# Patient Record
Sex: Female | Born: 1935 | Race: Black or African American | Hispanic: No | Smoking: Former smoker
Health system: Southern US, Community
[De-identification: ages and names within clinical notes are randomized; demographics above are authoritative.]

## PROBLEM LIST (undated history)

## (undated) DIAGNOSIS — Z955 Presence of coronary angioplasty implant and graft: Secondary | ICD-10-CM

## (undated) DIAGNOSIS — R011 Cardiac murmur, unspecified: Secondary | ICD-10-CM

## (undated) DIAGNOSIS — D649 Anemia, unspecified: Secondary | ICD-10-CM

## (undated) DIAGNOSIS — R7303 Prediabetes: Secondary | ICD-10-CM

## (undated) DIAGNOSIS — E785 Hyperlipidemia, unspecified: Secondary | ICD-10-CM

## (undated) DIAGNOSIS — I739 Peripheral vascular disease, unspecified: Secondary | ICD-10-CM

## (undated) DIAGNOSIS — E039 Hypothyroidism, unspecified: Secondary | ICD-10-CM

## (undated) DIAGNOSIS — I10 Essential (primary) hypertension: Secondary | ICD-10-CM

## (undated) DIAGNOSIS — Z9289 Personal history of other medical treatment: Secondary | ICD-10-CM

## (undated) DIAGNOSIS — M792 Neuralgia and neuritis, unspecified: Secondary | ICD-10-CM

## (undated) DIAGNOSIS — H409 Unspecified glaucoma: Secondary | ICD-10-CM

## (undated) DIAGNOSIS — B029 Zoster without complications: Secondary | ICD-10-CM

## (undated) DIAGNOSIS — R0989 Other specified symptoms and signs involving the circulatory and respiratory systems: Secondary | ICD-10-CM

## (undated) DIAGNOSIS — N183 Chronic kidney disease, stage 3 unspecified: Secondary | ICD-10-CM

## (undated) DIAGNOSIS — J45909 Unspecified asthma, uncomplicated: Secondary | ICD-10-CM

## (undated) DIAGNOSIS — I2510 Atherosclerotic heart disease of native coronary artery without angina pectoris: Secondary | ICD-10-CM

## (undated) HISTORY — DX: Hypothyroidism, unspecified: E03.9

## (undated) HISTORY — DX: Zoster without complications: B02.9

## (undated) HISTORY — DX: Prediabetes: R73.03

## (undated) HISTORY — DX: Unspecified glaucoma: H40.9

## (undated) HISTORY — DX: Personal history of other medical treatment: Z92.89

## (undated) HISTORY — DX: Presence of coronary angioplasty implant and graft: Z95.5

## (undated) HISTORY — DX: Neuralgia and neuritis, unspecified: M79.2

## (undated) HISTORY — DX: Essential (primary) hypertension: I10

## (undated) HISTORY — DX: Cardiac murmur, unspecified: R01.1

## (undated) HISTORY — DX: Hyperlipidemia, unspecified: E78.5

## (undated) HISTORY — DX: Anemia, unspecified: D64.9

## (undated) HISTORY — DX: Chronic kidney disease, stage 3 unspecified: N18.30

## (undated) HISTORY — DX: Unspecified asthma, uncomplicated: J45.909

## (undated) HISTORY — DX: Atherosclerotic heart disease of native coronary artery without angina pectoris: I25.10

## (undated) HISTORY — PX: BREAST LUMPECTOMY: SHX2

## (undated) HISTORY — PX: COLONOSCOPY: SHX174

## (undated) HISTORY — PX: CATARACT EXTRACTION: SUR2

## (undated) HISTORY — PX: TONSILLECTOMY: SUR1361

---
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
## 1999-05-28 ENCOUNTER — Other Ambulatory Visit: Admission: RE | Admit: 1999-05-28 | Discharge: 1999-05-28 | Payer: Self-pay | Admitting: Internal Medicine

## 1999-09-17 ENCOUNTER — Inpatient Hospital Stay (HOSPITAL_COMMUNITY): Admission: AD | Admit: 1999-09-17 | Discharge: 1999-09-20 | Payer: Self-pay | Admitting: Internal Medicine

## 2000-06-06 ENCOUNTER — Ambulatory Visit (HOSPITAL_COMMUNITY): Admission: RE | Admit: 2000-06-06 | Discharge: 2000-06-07 | Payer: Self-pay | Admitting: Cardiology

## 2000-06-24 ENCOUNTER — Encounter (HOSPITAL_COMMUNITY): Admission: RE | Admit: 2000-06-24 | Discharge: 2000-09-22 | Payer: Self-pay | Admitting: Cardiology

## 2000-09-30 ENCOUNTER — Ambulatory Visit (HOSPITAL_COMMUNITY): Admission: RE | Admit: 2000-09-30 | Discharge: 2000-10-01 | Payer: Self-pay | Admitting: Cardiology

## 2001-03-16 ENCOUNTER — Other Ambulatory Visit: Admission: RE | Admit: 2001-03-16 | Discharge: 2001-03-16 | Payer: Self-pay | Admitting: Internal Medicine

## 2004-06-18 ENCOUNTER — Ambulatory Visit (HOSPITAL_COMMUNITY): Admission: RE | Admit: 2004-06-18 | Discharge: 2004-06-18 | Payer: Self-pay | Admitting: Gastroenterology

## 2005-05-31 ENCOUNTER — Encounter: Admission: RE | Admit: 2005-05-31 | Discharge: 2005-05-31 | Payer: Self-pay | Admitting: Obstetrics and Gynecology

## 2005-07-05 ENCOUNTER — Ambulatory Visit (HOSPITAL_COMMUNITY): Admission: RE | Admit: 2005-07-05 | Discharge: 2005-07-05 | Payer: Self-pay | Admitting: Obstetrics and Gynecology

## 2011-04-27 ENCOUNTER — Emergency Department (HOSPITAL_COMMUNITY): Payer: Medicare Other

## 2011-04-27 ENCOUNTER — Emergency Department (HOSPITAL_COMMUNITY)
Admission: EM | Admit: 2011-04-27 | Discharge: 2011-04-27 | Disposition: A | Discharge status: Home / Self Care | Payer: Medicare Other | Attending: Emergency Medicine | Admitting: Emergency Medicine

## 2011-04-27 DIAGNOSIS — G8929 Other chronic pain: Secondary | ICD-10-CM | POA: Insufficient documentation

## 2011-04-27 DIAGNOSIS — I10 Essential (primary) hypertension: Secondary | ICD-10-CM | POA: Insufficient documentation

## 2011-04-27 DIAGNOSIS — R21 Rash and other nonspecific skin eruption: Secondary | ICD-10-CM | POA: Insufficient documentation

## 2011-04-27 DIAGNOSIS — I2510 Atherosclerotic heart disease of native coronary artery without angina pectoris: Secondary | ICD-10-CM | POA: Insufficient documentation

## 2011-04-27 DIAGNOSIS — B029 Zoster without complications: Secondary | ICD-10-CM | POA: Insufficient documentation

## 2011-04-27 DIAGNOSIS — M549 Dorsalgia, unspecified: Secondary | ICD-10-CM | POA: Insufficient documentation

## 2011-04-27 DIAGNOSIS — E7800 Pure hypercholesterolemia, unspecified: Secondary | ICD-10-CM | POA: Insufficient documentation

## 2011-04-27 DIAGNOSIS — E039 Hypothyroidism, unspecified: Secondary | ICD-10-CM | POA: Insufficient documentation

## 2011-07-26 DIAGNOSIS — Z79899 Other long term (current) drug therapy: Secondary | ICD-10-CM | POA: Diagnosis not present

## 2011-07-26 DIAGNOSIS — I10 Essential (primary) hypertension: Secondary | ICD-10-CM | POA: Diagnosis not present

## 2011-07-26 DIAGNOSIS — M545 Low back pain, unspecified: Secondary | ICD-10-CM | POA: Diagnosis not present

## 2011-10-08 DIAGNOSIS — Z1231 Encounter for screening mammogram for malignant neoplasm of breast: Secondary | ICD-10-CM | POA: Diagnosis not present

## 2011-11-05 DIAGNOSIS — I129 Hypertensive chronic kidney disease with stage 1 through stage 4 chronic kidney disease, or unspecified chronic kidney disease: Secondary | ICD-10-CM | POA: Diagnosis not present

## 2011-11-05 DIAGNOSIS — Z79899 Other long term (current) drug therapy: Secondary | ICD-10-CM | POA: Diagnosis not present

## 2011-11-05 DIAGNOSIS — E785 Hyperlipidemia, unspecified: Secondary | ICD-10-CM | POA: Diagnosis not present

## 2011-11-05 DIAGNOSIS — N183 Chronic kidney disease, stage 3 unspecified: Secondary | ICD-10-CM | POA: Diagnosis not present

## 2011-11-05 DIAGNOSIS — J3089 Other allergic rhinitis: Secondary | ICD-10-CM | POA: Diagnosis not present

## 2011-11-05 DIAGNOSIS — R7309 Other abnormal glucose: Secondary | ICD-10-CM | POA: Diagnosis not present

## 2011-11-05 DIAGNOSIS — J309 Allergic rhinitis, unspecified: Secondary | ICD-10-CM | POA: Diagnosis not present

## 2011-11-05 DIAGNOSIS — E039 Hypothyroidism, unspecified: Secondary | ICD-10-CM | POA: Diagnosis not present

## 2012-05-05 DIAGNOSIS — H2510 Age-related nuclear cataract, unspecified eye: Secondary | ICD-10-CM | POA: Diagnosis not present

## 2012-05-05 DIAGNOSIS — R51 Headache: Secondary | ICD-10-CM | POA: Diagnosis not present

## 2012-05-05 DIAGNOSIS — H40059 Ocular hypertension, unspecified eye: Secondary | ICD-10-CM | POA: Diagnosis not present

## 2012-05-05 DIAGNOSIS — H40019 Open angle with borderline findings, low risk, unspecified eye: Secondary | ICD-10-CM | POA: Diagnosis not present

## 2012-05-26 DIAGNOSIS — R7309 Other abnormal glucose: Secondary | ICD-10-CM | POA: Diagnosis not present

## 2012-05-26 DIAGNOSIS — Z79899 Other long term (current) drug therapy: Secondary | ICD-10-CM | POA: Diagnosis not present

## 2012-05-26 DIAGNOSIS — E039 Hypothyroidism, unspecified: Secondary | ICD-10-CM | POA: Diagnosis not present

## 2012-05-26 DIAGNOSIS — Z0000 Encounter for general adult medical examination without abnormal findings: Secondary | ICD-10-CM | POA: Diagnosis not present

## 2012-05-26 DIAGNOSIS — I10 Essential (primary) hypertension: Secondary | ICD-10-CM | POA: Diagnosis not present

## 2012-05-26 DIAGNOSIS — E785 Hyperlipidemia, unspecified: Secondary | ICD-10-CM | POA: Diagnosis not present

## 2012-05-26 DIAGNOSIS — Z23 Encounter for immunization: Secondary | ICD-10-CM | POA: Diagnosis not present

## 2012-05-26 DIAGNOSIS — J3089 Other allergic rhinitis: Secondary | ICD-10-CM | POA: Diagnosis not present

## 2012-05-26 DIAGNOSIS — Z1212 Encounter for screening for malignant neoplasm of rectum: Secondary | ICD-10-CM | POA: Diagnosis not present

## 2012-05-26 DIAGNOSIS — E559 Vitamin D deficiency, unspecified: Secondary | ICD-10-CM | POA: Diagnosis not present

## 2012-06-03 DIAGNOSIS — I10 Essential (primary) hypertension: Secondary | ICD-10-CM | POA: Diagnosis not present

## 2012-06-03 DIAGNOSIS — E785 Hyperlipidemia, unspecified: Secondary | ICD-10-CM | POA: Diagnosis not present

## 2012-06-03 DIAGNOSIS — Z23 Encounter for immunization: Secondary | ICD-10-CM | POA: Diagnosis not present

## 2012-06-03 DIAGNOSIS — E559 Vitamin D deficiency, unspecified: Secondary | ICD-10-CM | POA: Diagnosis not present

## 2012-06-03 DIAGNOSIS — Z1212 Encounter for screening for malignant neoplasm of rectum: Secondary | ICD-10-CM | POA: Diagnosis not present

## 2012-06-03 DIAGNOSIS — E039 Hypothyroidism, unspecified: Secondary | ICD-10-CM | POA: Diagnosis not present

## 2012-06-03 DIAGNOSIS — Z0000 Encounter for general adult medical examination without abnormal findings: Secondary | ICD-10-CM | POA: Diagnosis not present

## 2012-10-28 DIAGNOSIS — Z1231 Encounter for screening mammogram for malignant neoplasm of breast: Secondary | ICD-10-CM | POA: Diagnosis not present

## 2012-11-30 DIAGNOSIS — M810 Age-related osteoporosis without current pathological fracture: Secondary | ICD-10-CM | POA: Diagnosis not present

## 2012-11-30 DIAGNOSIS — R7309 Other abnormal glucose: Secondary | ICD-10-CM | POA: Diagnosis not present

## 2012-11-30 DIAGNOSIS — Z79899 Other long term (current) drug therapy: Secondary | ICD-10-CM | POA: Diagnosis not present

## 2012-11-30 DIAGNOSIS — E039 Hypothyroidism, unspecified: Secondary | ICD-10-CM | POA: Diagnosis not present

## 2012-11-30 DIAGNOSIS — Z0000 Encounter for general adult medical examination without abnormal findings: Secondary | ICD-10-CM | POA: Diagnosis not present

## 2012-11-30 DIAGNOSIS — I10 Essential (primary) hypertension: Secondary | ICD-10-CM | POA: Diagnosis not present

## 2012-11-30 DIAGNOSIS — E785 Hyperlipidemia, unspecified: Secondary | ICD-10-CM | POA: Diagnosis not present

## 2013-06-21 DIAGNOSIS — Z006 Encounter for examination for normal comparison and control in clinical research program: Secondary | ICD-10-CM | POA: Diagnosis not present

## 2013-06-21 DIAGNOSIS — E785 Hyperlipidemia, unspecified: Secondary | ICD-10-CM | POA: Diagnosis not present

## 2013-06-21 DIAGNOSIS — E669 Obesity, unspecified: Secondary | ICD-10-CM | POA: Diagnosis not present

## 2013-06-21 DIAGNOSIS — E039 Hypothyroidism, unspecified: Secondary | ICD-10-CM | POA: Diagnosis not present

## 2013-06-21 DIAGNOSIS — E559 Vitamin D deficiency, unspecified: Secondary | ICD-10-CM | POA: Diagnosis not present

## 2013-06-21 DIAGNOSIS — I119 Hypertensive heart disease without heart failure: Secondary | ICD-10-CM | POA: Diagnosis not present

## 2013-06-21 DIAGNOSIS — R7309 Other abnormal glucose: Secondary | ICD-10-CM | POA: Diagnosis not present

## 2013-06-21 DIAGNOSIS — M545 Low back pain, unspecified: Secondary | ICD-10-CM | POA: Diagnosis not present

## 2013-06-21 DIAGNOSIS — Z0000 Encounter for general adult medical examination without abnormal findings: Secondary | ICD-10-CM | POA: Diagnosis not present

## 2013-07-06 DIAGNOSIS — E785 Hyperlipidemia, unspecified: Secondary | ICD-10-CM | POA: Diagnosis not present

## 2013-08-31 DIAGNOSIS — R7309 Other abnormal glucose: Secondary | ICD-10-CM | POA: Diagnosis not present

## 2013-08-31 DIAGNOSIS — Z79899 Other long term (current) drug therapy: Secondary | ICD-10-CM | POA: Diagnosis not present

## 2013-08-31 DIAGNOSIS — I119 Hypertensive heart disease without heart failure: Secondary | ICD-10-CM | POA: Diagnosis not present

## 2013-10-29 DIAGNOSIS — Z1231 Encounter for screening mammogram for malignant neoplasm of breast: Secondary | ICD-10-CM | POA: Diagnosis not present

## 2013-12-20 DIAGNOSIS — Z79899 Other long term (current) drug therapy: Secondary | ICD-10-CM | POA: Diagnosis not present

## 2013-12-20 DIAGNOSIS — I10 Essential (primary) hypertension: Secondary | ICD-10-CM | POA: Diagnosis not present

## 2014-01-14 DIAGNOSIS — H469 Unspecified optic neuritis: Secondary | ICD-10-CM | POA: Diagnosis not present

## 2014-01-14 DIAGNOSIS — H2510 Age-related nuclear cataract, unspecified eye: Secondary | ICD-10-CM | POA: Diagnosis not present

## 2014-01-14 DIAGNOSIS — H4720 Unspecified optic atrophy: Secondary | ICD-10-CM | POA: Diagnosis not present

## 2014-01-14 DIAGNOSIS — H40059 Ocular hypertension, unspecified eye: Secondary | ICD-10-CM | POA: Diagnosis not present

## 2014-06-30 DIAGNOSIS — I10 Essential (primary) hypertension: Secondary | ICD-10-CM | POA: Diagnosis not present

## 2014-06-30 DIAGNOSIS — E784 Other hyperlipidemia: Secondary | ICD-10-CM | POA: Diagnosis not present

## 2014-06-30 DIAGNOSIS — Z79899 Other long term (current) drug therapy: Secondary | ICD-10-CM | POA: Diagnosis not present

## 2014-06-30 DIAGNOSIS — E039 Hypothyroidism, unspecified: Secondary | ICD-10-CM | POA: Diagnosis not present

## 2014-12-30 DIAGNOSIS — E784 Other hyperlipidemia: Secondary | ICD-10-CM | POA: Diagnosis not present

## 2014-12-30 DIAGNOSIS — E668 Other obesity: Secondary | ICD-10-CM | POA: Diagnosis not present

## 2014-12-30 DIAGNOSIS — Z79899 Other long term (current) drug therapy: Secondary | ICD-10-CM | POA: Diagnosis not present

## 2014-12-30 DIAGNOSIS — I119 Hypertensive heart disease without heart failure: Secondary | ICD-10-CM | POA: Diagnosis not present

## 2014-12-30 DIAGNOSIS — E039 Hypothyroidism, unspecified: Secondary | ICD-10-CM | POA: Diagnosis not present

## 2014-12-30 DIAGNOSIS — E559 Vitamin D deficiency, unspecified: Secondary | ICD-10-CM | POA: Diagnosis not present

## 2014-12-30 DIAGNOSIS — Z6837 Body mass index (BMI) 37.0-37.9, adult: Secondary | ICD-10-CM | POA: Diagnosis not present

## 2014-12-30 DIAGNOSIS — Z0000 Encounter for general adult medical examination without abnormal findings: Secondary | ICD-10-CM | POA: Diagnosis not present

## 2014-12-30 DIAGNOSIS — Z1389 Encounter for screening for other disorder: Secondary | ICD-10-CM | POA: Diagnosis not present

## 2015-01-26 DIAGNOSIS — H25813 Combined forms of age-related cataract, bilateral: Secondary | ICD-10-CM | POA: Diagnosis not present

## 2015-01-26 DIAGNOSIS — H40012 Open angle with borderline findings, low risk, left eye: Secondary | ICD-10-CM | POA: Diagnosis not present

## 2015-01-26 DIAGNOSIS — H4011X1 Primary open-angle glaucoma, mild stage: Secondary | ICD-10-CM | POA: Diagnosis not present

## 2015-02-09 DIAGNOSIS — H25813 Combined forms of age-related cataract, bilateral: Secondary | ICD-10-CM | POA: Diagnosis not present

## 2015-02-09 DIAGNOSIS — H4011X1 Primary open-angle glaucoma, mild stage: Secondary | ICD-10-CM | POA: Diagnosis not present

## 2015-02-09 DIAGNOSIS — H40012 Open angle with borderline findings, low risk, left eye: Secondary | ICD-10-CM | POA: Diagnosis not present

## 2015-02-17 DIAGNOSIS — H40033 Anatomical narrow angle, bilateral: Secondary | ICD-10-CM | POA: Diagnosis not present

## 2015-02-17 DIAGNOSIS — H40012 Open angle with borderline findings, low risk, left eye: Secondary | ICD-10-CM | POA: Diagnosis not present

## 2015-02-17 DIAGNOSIS — H4011X1 Primary open-angle glaucoma, mild stage: Secondary | ICD-10-CM | POA: Diagnosis not present

## 2015-03-10 DIAGNOSIS — H40012 Open angle with borderline findings, low risk, left eye: Secondary | ICD-10-CM | POA: Diagnosis not present

## 2015-03-10 DIAGNOSIS — H40033 Anatomical narrow angle, bilateral: Secondary | ICD-10-CM | POA: Diagnosis not present

## 2015-03-31 DIAGNOSIS — H40012 Open angle with borderline findings, low risk, left eye: Secondary | ICD-10-CM | POA: Diagnosis not present

## 2015-05-23 DIAGNOSIS — I119 Hypertensive heart disease without heart failure: Secondary | ICD-10-CM | POA: Diagnosis not present

## 2015-05-23 DIAGNOSIS — E039 Hypothyroidism, unspecified: Secondary | ICD-10-CM | POA: Diagnosis not present

## 2015-06-13 DIAGNOSIS — Z1231 Encounter for screening mammogram for malignant neoplasm of breast: Secondary | ICD-10-CM | POA: Diagnosis not present

## 2015-06-29 DIAGNOSIS — R7309 Other abnormal glucose: Secondary | ICD-10-CM | POA: Diagnosis not present

## 2015-06-29 DIAGNOSIS — I10 Essential (primary) hypertension: Secondary | ICD-10-CM | POA: Diagnosis not present

## 2015-06-29 DIAGNOSIS — E039 Hypothyroidism, unspecified: Secondary | ICD-10-CM | POA: Diagnosis not present

## 2015-09-28 DIAGNOSIS — H401111 Primary open-angle glaucoma, right eye, mild stage: Secondary | ICD-10-CM | POA: Diagnosis not present

## 2015-09-28 DIAGNOSIS — H2513 Age-related nuclear cataract, bilateral: Secondary | ICD-10-CM | POA: Diagnosis not present

## 2015-09-28 DIAGNOSIS — H40012 Open angle with borderline findings, low risk, left eye: Secondary | ICD-10-CM | POA: Diagnosis not present

## 2015-09-29 DIAGNOSIS — R05 Cough: Secondary | ICD-10-CM | POA: Diagnosis not present

## 2015-09-29 DIAGNOSIS — E784 Other hyperlipidemia: Secondary | ICD-10-CM | POA: Diagnosis not present

## 2015-09-29 DIAGNOSIS — I10 Essential (primary) hypertension: Secondary | ICD-10-CM | POA: Diagnosis not present

## 2015-09-29 DIAGNOSIS — E039 Hypothyroidism, unspecified: Secondary | ICD-10-CM | POA: Diagnosis not present

## 2015-12-29 DIAGNOSIS — I10 Essential (primary) hypertension: Secondary | ICD-10-CM | POA: Diagnosis not present

## 2015-12-29 DIAGNOSIS — E039 Hypothyroidism, unspecified: Secondary | ICD-10-CM | POA: Diagnosis not present

## 2015-12-29 DIAGNOSIS — R7303 Prediabetes: Secondary | ICD-10-CM | POA: Diagnosis not present

## 2016-03-14 DIAGNOSIS — Z87891 Personal history of nicotine dependence: Secondary | ICD-10-CM | POA: Diagnosis not present

## 2016-03-14 DIAGNOSIS — R0600 Dyspnea, unspecified: Secondary | ICD-10-CM | POA: Diagnosis not present

## 2016-03-21 ENCOUNTER — Other Ambulatory Visit: Payer: Self-pay | Admitting: Nurse Practitioner

## 2016-03-21 ENCOUNTER — Ambulatory Visit
Admission: RE | Admit: 2016-03-21 | Discharge: 2016-03-21 | Disposition: A | Discharge status: Home / Self Care | Payer: Medicare Other | Source: Ambulatory Visit | Attending: Nurse Practitioner | Admitting: Nurse Practitioner

## 2016-03-21 DIAGNOSIS — R05 Cough: Secondary | ICD-10-CM

## 2016-03-21 DIAGNOSIS — R059 Cough, unspecified: Secondary | ICD-10-CM

## 2016-03-21 DIAGNOSIS — R0600 Dyspnea, unspecified: Secondary | ICD-10-CM

## 2016-03-21 DIAGNOSIS — R0602 Shortness of breath: Secondary | ICD-10-CM | POA: Diagnosis not present

## 2016-04-11 DIAGNOSIS — H40012 Open angle with borderline findings, low risk, left eye: Secondary | ICD-10-CM | POA: Diagnosis not present

## 2016-04-11 DIAGNOSIS — H25813 Combined forms of age-related cataract, bilateral: Secondary | ICD-10-CM | POA: Diagnosis not present

## 2016-04-11 DIAGNOSIS — H401111 Primary open-angle glaucoma, right eye, mild stage: Secondary | ICD-10-CM | POA: Diagnosis not present

## 2016-04-18 DIAGNOSIS — R05 Cough: Secondary | ICD-10-CM | POA: Diagnosis not present

## 2016-08-22 DIAGNOSIS — Z0000 Encounter for general adult medical examination without abnormal findings: Secondary | ICD-10-CM | POA: Diagnosis not present

## 2016-08-22 DIAGNOSIS — Z87891 Personal history of nicotine dependence: Secondary | ICD-10-CM | POA: Diagnosis not present

## 2016-08-22 DIAGNOSIS — M810 Age-related osteoporosis without current pathological fracture: Secondary | ICD-10-CM | POA: Diagnosis not present

## 2016-08-22 DIAGNOSIS — I10 Essential (primary) hypertension: Secondary | ICD-10-CM | POA: Diagnosis not present

## 2016-08-22 DIAGNOSIS — E039 Hypothyroidism, unspecified: Secondary | ICD-10-CM | POA: Diagnosis not present

## 2016-08-22 DIAGNOSIS — R7309 Other abnormal glucose: Secondary | ICD-10-CM | POA: Diagnosis not present

## 2016-08-29 DIAGNOSIS — Z1231 Encounter for screening mammogram for malignant neoplasm of breast: Secondary | ICD-10-CM | POA: Diagnosis not present

## 2016-10-09 DIAGNOSIS — H25813 Combined forms of age-related cataract, bilateral: Secondary | ICD-10-CM | POA: Diagnosis not present

## 2016-10-09 DIAGNOSIS — H40012 Open angle with borderline findings, low risk, left eye: Secondary | ICD-10-CM | POA: Diagnosis not present

## 2016-10-09 DIAGNOSIS — H401111 Primary open-angle glaucoma, right eye, mild stage: Secondary | ICD-10-CM | POA: Diagnosis not present

## 2016-10-09 DIAGNOSIS — E119 Type 2 diabetes mellitus without complications: Secondary | ICD-10-CM | POA: Diagnosis not present

## 2016-11-14 ENCOUNTER — Other Ambulatory Visit: Payer: Self-pay | Admitting: Nephrology

## 2016-11-14 DIAGNOSIS — N183 Chronic kidney disease, stage 3 unspecified: Secondary | ICD-10-CM

## 2016-11-14 DIAGNOSIS — I10 Essential (primary) hypertension: Secondary | ICD-10-CM | POA: Diagnosis not present

## 2016-11-21 ENCOUNTER — Ambulatory Visit
Admission: RE | Admit: 2016-11-21 | Discharge: 2016-11-21 | Disposition: A | Discharge status: Home / Self Care | Payer: Medicare Other | Source: Ambulatory Visit | Attending: Nephrology | Admitting: Nephrology

## 2016-11-21 DIAGNOSIS — N183 Chronic kidney disease, stage 3 unspecified: Secondary | ICD-10-CM

## 2016-12-27 DIAGNOSIS — Z6833 Body mass index (BMI) 33.0-33.9, adult: Secondary | ICD-10-CM | POA: Diagnosis not present

## 2016-12-27 DIAGNOSIS — I10 Essential (primary) hypertension: Secondary | ICD-10-CM | POA: Diagnosis not present

## 2016-12-27 DIAGNOSIS — N183 Chronic kidney disease, stage 3 (moderate): Secondary | ICD-10-CM | POA: Diagnosis not present

## 2016-12-27 DIAGNOSIS — D472 Monoclonal gammopathy: Secondary | ICD-10-CM | POA: Diagnosis not present

## 2017-01-01 ENCOUNTER — Other Ambulatory Visit (HOSPITAL_COMMUNITY): Payer: Self-pay | Admitting: Nephrology

## 2017-01-01 DIAGNOSIS — D472 Monoclonal gammopathy: Secondary | ICD-10-CM

## 2017-01-03 ENCOUNTER — Ambulatory Visit (HOSPITAL_COMMUNITY)
Admission: RE | Admit: 2017-01-03 | Discharge: 2017-01-03 | Disposition: A | Discharge status: Home / Self Care | Payer: Medicare Other | Source: Ambulatory Visit | Attending: Nephrology | Admitting: Nephrology

## 2017-01-03 DIAGNOSIS — R930 Abnormal findings on diagnostic imaging of skull and head, not elsewhere classified: Secondary | ICD-10-CM | POA: Diagnosis not present

## 2017-01-03 DIAGNOSIS — D472 Monoclonal gammopathy: Secondary | ICD-10-CM | POA: Diagnosis not present

## 2017-01-03 DIAGNOSIS — M50823 Other cervical disc disorders at C6-C7 level: Secondary | ICD-10-CM | POA: Insufficient documentation

## 2017-02-03 DIAGNOSIS — M4306 Spondylolysis, lumbar region: Secondary | ICD-10-CM | POA: Diagnosis not present

## 2017-02-03 DIAGNOSIS — M545 Low back pain: Secondary | ICD-10-CM | POA: Diagnosis not present

## 2017-02-03 DIAGNOSIS — I10 Essential (primary) hypertension: Secondary | ICD-10-CM | POA: Diagnosis not present

## 2017-02-03 DIAGNOSIS — E039 Hypothyroidism, unspecified: Secondary | ICD-10-CM | POA: Diagnosis not present

## 2017-02-03 DIAGNOSIS — M47816 Spondylosis without myelopathy or radiculopathy, lumbar region: Secondary | ICD-10-CM | POA: Diagnosis not present

## 2017-02-03 DIAGNOSIS — M5441 Lumbago with sciatica, right side: Secondary | ICD-10-CM | POA: Diagnosis not present

## 2017-02-17 ENCOUNTER — Other Ambulatory Visit: Payer: Self-pay | Admitting: Nephrology

## 2017-02-17 DIAGNOSIS — S14111S Complete lesion at C1 level of cervical spinal cord, sequela: Secondary | ICD-10-CM

## 2017-02-21 ENCOUNTER — Ambulatory Visit
Admission: RE | Admit: 2017-02-21 | Discharge: 2017-02-21 | Disposition: A | Discharge status: Home / Self Care | Payer: Medicare Other | Source: Ambulatory Visit | Attending: Nephrology | Admitting: Nephrology

## 2017-02-21 DIAGNOSIS — S14111S Complete lesion at C1 level of cervical spinal cord, sequela: Secondary | ICD-10-CM

## 2017-02-21 DIAGNOSIS — M50223 Other cervical disc displacement at C6-C7 level: Secondary | ICD-10-CM | POA: Diagnosis not present

## 2017-02-21 DIAGNOSIS — M50221 Other cervical disc displacement at C4-C5 level: Secondary | ICD-10-CM | POA: Diagnosis not present

## 2017-02-21 DIAGNOSIS — M50222 Other cervical disc displacement at C5-C6 level: Secondary | ICD-10-CM | POA: Diagnosis not present

## 2017-02-21 MED ORDER — IOPAMIDOL (ISOVUE-300) INJECTION 61%
75.0000 mL | Freq: Once | INTRAVENOUS | Status: AC | PRN
  Administered 2017-02-21: 75 mL via INTRAVENOUS

## 2017-02-28 DIAGNOSIS — D472 Monoclonal gammopathy: Secondary | ICD-10-CM | POA: Diagnosis not present

## 2017-02-28 DIAGNOSIS — N183 Chronic kidney disease, stage 3 (moderate): Secondary | ICD-10-CM | POA: Diagnosis not present

## 2017-02-28 DIAGNOSIS — Z6833 Body mass index (BMI) 33.0-33.9, adult: Secondary | ICD-10-CM | POA: Diagnosis not present

## 2017-02-28 DIAGNOSIS — I10 Essential (primary) hypertension: Secondary | ICD-10-CM | POA: Diagnosis not present

## 2017-03-04 DIAGNOSIS — I10 Essential (primary) hypertension: Secondary | ICD-10-CM | POA: Diagnosis not present

## 2017-03-04 DIAGNOSIS — M5441 Lumbago with sciatica, right side: Secondary | ICD-10-CM | POA: Diagnosis not present

## 2017-03-04 DIAGNOSIS — G8929 Other chronic pain: Secondary | ICD-10-CM | POA: Diagnosis not present

## 2017-03-13 DIAGNOSIS — M4317 Spondylolisthesis, lumbosacral region: Secondary | ICD-10-CM | POA: Diagnosis not present

## 2017-03-13 DIAGNOSIS — M47819 Spondylosis without myelopathy or radiculopathy, site unspecified: Secondary | ICD-10-CM | POA: Diagnosis not present

## 2017-03-19 DIAGNOSIS — M545 Low back pain: Secondary | ICD-10-CM | POA: Diagnosis not present

## 2017-03-19 DIAGNOSIS — I10 Essential (primary) hypertension: Secondary | ICD-10-CM | POA: Diagnosis not present

## 2017-03-19 DIAGNOSIS — E039 Hypothyroidism, unspecified: Secondary | ICD-10-CM | POA: Diagnosis not present

## 2017-04-02 DIAGNOSIS — M25551 Pain in right hip: Secondary | ICD-10-CM | POA: Diagnosis not present

## 2017-04-02 DIAGNOSIS — M545 Low back pain: Secondary | ICD-10-CM | POA: Diagnosis not present

## 2017-04-11 DIAGNOSIS — M25551 Pain in right hip: Secondary | ICD-10-CM | POA: Diagnosis not present

## 2017-04-11 DIAGNOSIS — M545 Low back pain: Secondary | ICD-10-CM | POA: Diagnosis not present

## 2017-04-14 DIAGNOSIS — M25551 Pain in right hip: Secondary | ICD-10-CM | POA: Diagnosis not present

## 2017-04-14 DIAGNOSIS — M545 Low back pain: Secondary | ICD-10-CM | POA: Diagnosis not present

## 2017-04-17 DIAGNOSIS — H469 Unspecified optic neuritis: Secondary | ICD-10-CM | POA: Diagnosis not present

## 2017-04-17 DIAGNOSIS — H401111 Primary open-angle glaucoma, right eye, mild stage: Secondary | ICD-10-CM | POA: Diagnosis not present

## 2017-04-17 DIAGNOSIS — E119 Type 2 diabetes mellitus without complications: Secondary | ICD-10-CM | POA: Diagnosis not present

## 2017-04-17 DIAGNOSIS — H25813 Combined forms of age-related cataract, bilateral: Secondary | ICD-10-CM | POA: Diagnosis not present

## 2017-04-17 DIAGNOSIS — H40012 Open angle with borderline findings, low risk, left eye: Secondary | ICD-10-CM | POA: Diagnosis not present

## 2017-04-18 ENCOUNTER — Encounter: Payer: Self-pay | Admitting: Podiatry

## 2017-04-18 ENCOUNTER — Ambulatory Visit (INDEPENDENT_AMBULATORY_CARE_PROVIDER_SITE_OTHER): Payer: Medicare Other | Admitting: Podiatry

## 2017-04-18 VITALS — BP 152/73 | HR 64

## 2017-04-18 DIAGNOSIS — B351 Tinea unguium: Secondary | ICD-10-CM | POA: Diagnosis not present

## 2017-04-18 DIAGNOSIS — M79674 Pain in right toe(s): Secondary | ICD-10-CM | POA: Diagnosis not present

## 2017-04-18 DIAGNOSIS — M25551 Pain in right hip: Secondary | ICD-10-CM | POA: Diagnosis not present

## 2017-04-18 DIAGNOSIS — M79675 Pain in left toe(s): Secondary | ICD-10-CM | POA: Diagnosis not present

## 2017-04-18 DIAGNOSIS — M545 Low back pain: Secondary | ICD-10-CM | POA: Diagnosis not present

## 2017-04-18 NOTE — Progress Notes (Signed)
   Subjective:    Patient ID: Brenda Barry, female    DOB: 01/20/1936, 81 y.o.   MRN: [id number]  HPI this patient presents the office with chief complaint of long thick painful nails.  She states her nails are painful walking and wearing her shoes.  She says that she is unable to self treat.  She presents the office today for preventative foot care services    Review of Systems  HENT: Positive for hearing loss and tinnitus.   Cardiovascular: Positive for leg swelling.  Genitourinary: Positive for frequency.  Musculoskeletal: Positive for gait problem.       Joint pain  Skin:       Change in nails  Neurological: Positive for numbness.       Objective:   Physical Exam General Appearance  Alert, conversant and in no acute stress.  Vascular  Dorsalis pedis and posterior pulses are palpable  bilaterally.  Capillary return is within normal limits  Bilaterally. Temperature is within normal limits  Bilaterally  Neurologic  Senn-Weinstein monofilament wire test within normal limits  bilaterally. Muscle power  Within normal limits bilaterally.  Nails Thick disfigured discolored nails with subungual debride bilaterally from hallux to fifth toes bilaterally. No evidence of bacterial infection or drainage bilaterally.  Orthopedic  No limitations of motion of motion feet bilaterally.  No crepitus or effusions noted.  HAV  B/L   Skin  normotropic skin with no porokeratosis noted bilaterally.  No signs of infection ulcers noted.          Assessment & Plan:  Onychomycosis  B/L  IE  Debridement of nails.  RTC 3 months.   Gardiner Barefoot DPM

## 2017-04-21 DIAGNOSIS — M25551 Pain in right hip: Secondary | ICD-10-CM | POA: Diagnosis not present

## 2017-04-21 DIAGNOSIS — M545 Low back pain: Secondary | ICD-10-CM | POA: Diagnosis not present

## 2017-04-24 DIAGNOSIS — M545 Low back pain: Secondary | ICD-10-CM | POA: Diagnosis not present

## 2017-04-24 DIAGNOSIS — M25551 Pain in right hip: Secondary | ICD-10-CM | POA: Diagnosis not present

## 2017-04-29 DIAGNOSIS — D472 Monoclonal gammopathy: Secondary | ICD-10-CM | POA: Diagnosis not present

## 2017-04-29 DIAGNOSIS — N183 Chronic kidney disease, stage 3 (moderate): Secondary | ICD-10-CM | POA: Diagnosis not present

## 2017-04-29 DIAGNOSIS — Z6833 Body mass index (BMI) 33.0-33.9, adult: Secondary | ICD-10-CM | POA: Diagnosis not present

## 2017-04-29 DIAGNOSIS — I10 Essential (primary) hypertension: Secondary | ICD-10-CM | POA: Diagnosis not present

## 2017-05-05 DIAGNOSIS — H268 Other specified cataract: Secondary | ICD-10-CM | POA: Diagnosis not present

## 2017-05-05 DIAGNOSIS — H2511 Age-related nuclear cataract, right eye: Secondary | ICD-10-CM | POA: Diagnosis not present

## 2017-05-12 DIAGNOSIS — M25551 Pain in right hip: Secondary | ICD-10-CM | POA: Diagnosis not present

## 2017-05-12 DIAGNOSIS — M545 Low back pain: Secondary | ICD-10-CM | POA: Diagnosis not present

## 2017-05-15 DIAGNOSIS — H2512 Age-related nuclear cataract, left eye: Secondary | ICD-10-CM | POA: Diagnosis not present

## 2017-05-19 DIAGNOSIS — H25812 Combined forms of age-related cataract, left eye: Secondary | ICD-10-CM | POA: Diagnosis not present

## 2017-05-19 DIAGNOSIS — H2512 Age-related nuclear cataract, left eye: Secondary | ICD-10-CM | POA: Diagnosis not present

## 2017-05-27 DIAGNOSIS — M545 Low back pain: Secondary | ICD-10-CM | POA: Diagnosis not present

## 2017-05-27 DIAGNOSIS — M25551 Pain in right hip: Secondary | ICD-10-CM | POA: Diagnosis not present

## 2017-05-29 DIAGNOSIS — M25551 Pain in right hip: Secondary | ICD-10-CM | POA: Diagnosis not present

## 2017-05-29 DIAGNOSIS — M545 Low back pain: Secondary | ICD-10-CM | POA: Diagnosis not present

## 2017-06-10 DIAGNOSIS — M25551 Pain in right hip: Secondary | ICD-10-CM | POA: Diagnosis not present

## 2017-06-10 DIAGNOSIS — M545 Low back pain: Secondary | ICD-10-CM | POA: Diagnosis not present

## 2017-06-17 DIAGNOSIS — M25551 Pain in right hip: Secondary | ICD-10-CM | POA: Diagnosis not present

## 2017-06-17 DIAGNOSIS — M545 Low back pain: Secondary | ICD-10-CM | POA: Diagnosis not present

## 2017-07-18 ENCOUNTER — Ambulatory Visit (INDEPENDENT_AMBULATORY_CARE_PROVIDER_SITE_OTHER): Payer: Medicare Other | Admitting: Podiatry

## 2017-07-18 ENCOUNTER — Encounter: Payer: Self-pay | Admitting: Podiatry

## 2017-07-18 DIAGNOSIS — M79674 Pain in right toe(s): Secondary | ICD-10-CM

## 2017-07-18 DIAGNOSIS — B351 Tinea unguium: Secondary | ICD-10-CM

## 2017-07-18 DIAGNOSIS — M79675 Pain in left toe(s): Secondary | ICD-10-CM

## 2017-07-18 NOTE — Progress Notes (Addendum)
Complaint:  Visit Type: Patient returns to my office for continued preventative foot care services. Complaint: Patient states" my nails have grown long and thick and become painful to walk and wear shoes"The patient presents for preventative foot care services. No changes to ROS  Podiatric Exam: Vascular: dorsalis pedis and posterior tibial pulses are palpable bilateral. Capillary return is immediate. Temperature gradient is WNL. Skin turgor WNL  Sensorium: Normal Semmes Weinstein monofilament test. Normal tactile sensation bilaterally. Nail Exam: Pt has thick disfigured discolored nails with subungual debris noted bilateral entire nail hallux through fifth toenails Ulcer Exam: There is no evidence of ulcer or pre-ulcerative changes or infection. Orthopedic Exam: Muscle tone and strength are WNL. No limitations in general ROM. No crepitus or effusions noted. Foot type and digits show no abnormalities. HAV  B/L. Skin: No Porokeratosis. No infection or ulcers  Diagnosis:  Onychomycosis, , Pain in right toe, pain in left toes  Treatment & Plan Procedures and Treatment: Consent by patient was obtained for treatment procedures.   Debridement of mycotic and hypertrophic toenails, 1 through 5 bilateral and clearing of subungual debris. No ulceration, no infection noted. ABN signed for 2018. Return Visit-Office Procedure: Patient instructed to return to the office for a follow up visit 3 months for continued evaluation and treatment.    Gardiner Barefoot DPM

## 2017-07-24 DIAGNOSIS — E039 Hypothyroidism, unspecified: Secondary | ICD-10-CM | POA: Diagnosis not present

## 2017-07-24 DIAGNOSIS — M5441 Lumbago with sciatica, right side: Secondary | ICD-10-CM | POA: Diagnosis not present

## 2017-07-24 DIAGNOSIS — G8929 Other chronic pain: Secondary | ICD-10-CM | POA: Diagnosis not present

## 2017-07-24 DIAGNOSIS — I10 Essential (primary) hypertension: Secondary | ICD-10-CM | POA: Diagnosis not present

## 2017-10-17 ENCOUNTER — Ambulatory Visit (INDEPENDENT_AMBULATORY_CARE_PROVIDER_SITE_OTHER): Payer: Medicare Other | Admitting: Podiatry

## 2017-10-17 ENCOUNTER — Encounter: Payer: Self-pay | Admitting: Podiatry

## 2017-10-17 DIAGNOSIS — B351 Tinea unguium: Secondary | ICD-10-CM | POA: Diagnosis not present

## 2017-10-17 DIAGNOSIS — M79675 Pain in left toe(s): Secondary | ICD-10-CM | POA: Diagnosis not present

## 2017-10-17 DIAGNOSIS — M79674 Pain in right toe(s): Secondary | ICD-10-CM | POA: Diagnosis not present

## 2017-10-17 NOTE — Progress Notes (Signed)
Complaint:  Visit Type: Patient returns to my office for continued preventative foot care services. Complaint: Patient states" my nails have grown long and thick and become painful to walk and wear shoes"The patient presents for preventative foot care services. No changes to ROS  Podiatric Exam: Vascular: dorsalis pedis and posterior tibial pulses are palpable bilateral. Capillary return is immediate. Temperature gradient is WNL. Skin turgor WNL  Sensorium: Normal Semmes Weinstein monofilament test. Normal tactile sensation bilaterally. Nail Exam: Pt has thick disfigured discolored nails with subungual debris noted bilateral entire nail hallux through fifth toenails Ulcer Exam: There is no evidence of ulcer or pre-ulcerative changes or infection. Orthopedic Exam: Muscle tone and strength are WNL. No limitations in general ROM. No crepitus or effusions noted. Foot type and digits show no abnormalities. HAV  B/L. Skin: No Porokeratosis. No infection or ulcers  Diagnosis:  Onychomycosis, , Pain in right toe, pain in left toes  Treatment & Plan Procedures and Treatment: Consent by patient was obtained for treatment procedures.   Debridement of mycotic and hypertrophic toenails, 1 through 5 bilateral and clearing of subungual debris. No ulceration, no infection noted. ABN signed for 2019. Return Visit-Office Procedure: Patient instructed to return to the office for a follow up visit 3 months for continued evaluation and treatment.    Umaima Scholten DPM 

## 2017-12-16 DIAGNOSIS — H469 Unspecified optic neuritis: Secondary | ICD-10-CM | POA: Diagnosis not present

## 2017-12-16 DIAGNOSIS — H401131 Primary open-angle glaucoma, bilateral, mild stage: Secondary | ICD-10-CM | POA: Diagnosis not present

## 2017-12-16 DIAGNOSIS — E119 Type 2 diabetes mellitus without complications: Secondary | ICD-10-CM | POA: Diagnosis not present

## 2017-12-16 DIAGNOSIS — H04123 Dry eye syndrome of bilateral lacrimal glands: Secondary | ICD-10-CM | POA: Diagnosis not present

## 2017-12-16 DIAGNOSIS — Z961 Presence of intraocular lens: Secondary | ICD-10-CM | POA: Diagnosis not present

## 2018-01-16 ENCOUNTER — Ambulatory Visit (INDEPENDENT_AMBULATORY_CARE_PROVIDER_SITE_OTHER): Payer: Medicare Other | Admitting: Podiatry

## 2018-01-16 ENCOUNTER — Encounter: Payer: Self-pay | Admitting: Podiatry

## 2018-01-16 DIAGNOSIS — M79674 Pain in right toe(s): Secondary | ICD-10-CM | POA: Diagnosis not present

## 2018-01-16 DIAGNOSIS — M79675 Pain in left toe(s): Secondary | ICD-10-CM

## 2018-01-16 DIAGNOSIS — B351 Tinea unguium: Secondary | ICD-10-CM | POA: Diagnosis not present

## 2018-01-16 NOTE — Progress Notes (Signed)
Complaint:  Visit Type: Patient returns to my office for continued preventative foot care services. Complaint: Patient states" my nails have grown long and thick and become painful to walk and wear shoes"The patient presents for preventative foot care services. No changes to ROS  Podiatric Exam: Vascular: dorsalis pedis and posterior tibial pulses are palpable bilateral. Capillary return is immediate. Temperature gradient is WNL. Skin turgor WNL  Sensorium: Normal Semmes Weinstein monofilament test. Normal tactile sensation bilaterally. Nail Exam: Pt has thick disfigured discolored nails with subungual debris noted bilateral entire nail hallux through fifth toenails Ulcer Exam: There is no evidence of ulcer or pre-ulcerative changes or infection. Orthopedic Exam: Muscle tone and strength are WNL. No limitations in general ROM. No crepitus or effusions noted. Foot type and digits show no abnormalities. HAV  B/L. Skin: No Porokeratosis. No infection or ulcers  Diagnosis:  Onychomycosis, , Pain in right toe, pain in left toes  Treatment & Plan Procedures and Treatment: Consent by patient was obtained for treatment procedures.   Debridement of mycotic and hypertrophic toenails, 1 through 5 bilateral and clearing of subungual debris. No ulceration, no infection noted. ABN signed for 2019. Return Visit-Office Procedure: Patient instructed to return to the office for a follow up visit 3 months for continued evaluation and treatment.    Gardiner Barefoot DPM

## 2018-01-21 DIAGNOSIS — I10 Essential (primary) hypertension: Secondary | ICD-10-CM | POA: Diagnosis not present

## 2018-01-21 DIAGNOSIS — E039 Hypothyroidism, unspecified: Secondary | ICD-10-CM | POA: Diagnosis not present

## 2018-01-21 DIAGNOSIS — J45909 Unspecified asthma, uncomplicated: Secondary | ICD-10-CM | POA: Diagnosis not present

## 2018-01-21 DIAGNOSIS — E785 Hyperlipidemia, unspecified: Secondary | ICD-10-CM | POA: Diagnosis not present

## 2018-03-02 DIAGNOSIS — E785 Hyperlipidemia, unspecified: Secondary | ICD-10-CM | POA: Diagnosis not present

## 2018-03-02 DIAGNOSIS — I10 Essential (primary) hypertension: Secondary | ICD-10-CM | POA: Diagnosis not present

## 2018-04-03 ENCOUNTER — Other Ambulatory Visit: Payer: Self-pay

## 2018-04-03 ENCOUNTER — Emergency Department (HOSPITAL_COMMUNITY)
Admission: EM | Admit: 2018-04-03 | Discharge: 2018-04-03 | Disposition: A | Discharge status: Home / Self Care | Payer: Medicare Other | Attending: Emergency Medicine | Admitting: Emergency Medicine

## 2018-04-03 ENCOUNTER — Encounter (HOSPITAL_COMMUNITY): Payer: Self-pay

## 2018-04-03 DIAGNOSIS — M5441 Lumbago with sciatica, right side: Secondary | ICD-10-CM | POA: Diagnosis not present

## 2018-04-03 DIAGNOSIS — Z7982 Long term (current) use of aspirin: Secondary | ICD-10-CM | POA: Diagnosis not present

## 2018-04-03 DIAGNOSIS — M5431 Sciatica, right side: Secondary | ICD-10-CM | POA: Diagnosis not present

## 2018-04-03 DIAGNOSIS — Z79899 Other long term (current) drug therapy: Secondary | ICD-10-CM | POA: Insufficient documentation

## 2018-04-03 DIAGNOSIS — Z87891 Personal history of nicotine dependence: Secondary | ICD-10-CM | POA: Diagnosis not present

## 2018-04-03 DIAGNOSIS — M25551 Pain in right hip: Secondary | ICD-10-CM | POA: Diagnosis present

## 2018-04-03 DIAGNOSIS — R202 Paresthesia of skin: Secondary | ICD-10-CM | POA: Diagnosis not present

## 2018-04-03 MED ORDER — HYDROCODONE-ACETAMINOPHEN 5-325 MG PO TABS: 1.0000 | ORAL_TABLET | Freq: Four times a day (QID) | ORAL | 0 refills | Status: DC | PRN

## 2018-04-03 MED ORDER — DEXAMETHASONE SODIUM PHOSPHATE 10 MG/ML IJ SOLN
10.0000 mg | Freq: Once | INTRAMUSCULAR | Status: AC
  Administered 2018-04-03: 10 mg via INTRAMUSCULAR
  Filled 2018-04-03: qty 1

## 2018-04-03 MED ORDER — ORPHENADRINE CITRATE ER 100 MG PO TB12
100.0000 mg | ORAL_TABLET | Freq: Once | ORAL | Status: AC
  Administered 2018-04-03: 100 mg via ORAL
  Filled 2018-04-03: qty 1

## 2018-04-03 MED ORDER — ORPHENADRINE CITRATE ER 100 MG PO TB12: 100.0000 mg | ORAL_TABLET | Freq: Two times a day (BID) | ORAL | 0 refills | Status: DC

## 2018-04-03 MED ORDER — HYDROMORPHONE HCL 1 MG/ML IJ SOLN
1.0000 mg | Freq: Once | INTRAMUSCULAR | Status: AC
  Administered 2018-04-03: 1 mg via INTRAMUSCULAR
  Filled 2018-04-03: qty 1

## 2018-04-03 MED ORDER — PREDNISONE 20 MG PO TABS: ORAL_TABLET | ORAL | 0 refills | Status: DC

## 2018-04-03 NOTE — ED Provider Notes (Signed)
[facility] EMERGENCY DEPARTMENT Provider Note   CSN: [id number] Arrival date & time: 04/03/18  0731     History   Chief Complaint Chief Complaint  Patient presents with  . Hip Pain    HPI Brenda Barry is a 82 y.o. female.  HPI Patient has had ongoing problems with right-sided back and hip pain for about a year.  She reports that she has had x-rays that show osteoporosis and degenerative joint disease.  She used to take anti-inflammatories but was subsequently told they were not safe for her kidneys.  She only occasionally takes Tylenol but she feels that that gives her tinnitus so she also avoids it as much as possible.  Over the past several days the pain got much worse including radiation down the front of her thigh and tingling in her toes.  She reports that she usually takes care of all of her own affairs.  She walks without a cane and does chores.  She drives.  She does note that regardless of any exacerbations of the back pain, she always will get pain if she extends the right leg as she is getting in and out of a car.  She reports only as of today has the pain been so severe as to impede her walking.  There is been no change in bowel or bladder habits.  No incontinence or urinary retention.  No abdominal pain. History reviewed. No pertinent past medical history.  There are no active problems to display for this patient.   History reviewed. No pertinent surgical history.   OB History   None      Home Medications    Prior to Admission medications   Medication Sig Start Date End Date Taking? Authorizing Provider  Acetaminophen (TYLENOL ARTHRITIS PAIN PO) Take by mouth as needed.    [provider]  amLODipine (NORVASC) 5 MG tablet  03/20/17   [provider]  aspirin 81 MG chewable tablet Chew by mouth.    [provider]  HYDROcodone-acetaminophen (NORCO/VICODIN) 5-325 MG tablet Take 1-2 tablets by mouth every 6 (six)  hours as needed for moderate pain or severe pain. 04/03/18   Charlesetta Shanks, MD  irbesartan-hydrochlorothiazide (AVALIDE) 300-12.5 MG tablet Take 1 tablet by mouth daily.    [provider]  metoprolol succinate (TOPROL-XL) 100 MG 24 hr tablet  03/19/17   [provider]  orphenadrine (NORFLEX) 100 MG tablet Take 1 tablet (100 mg total) by mouth 2 (two) times daily. 04/03/18   Charlesetta Shanks, MD  predniSONE (DELTASONE) 20 MG tablet 2 tabs po daily x 3 days start date 9/16 04/06/18   Charlesetta Shanks, MD  SYNTHROID 100 MCG tablet  03/19/17   [provider]    Family History No family history on file.  Social History Social History   Tobacco Use  . Smoking status: Former Smoker    Types: Cigarettes    Last attempt to quit: 04/19/1971    Years since quitting: 46.9  . Smokeless tobacco: Never Used  Substance Use Topics  . Alcohol use: No  . Drug use: No     Allergies   Patient has no known allergies.   Review of Systems Review of Systems 10 Systems reviewed and are negative for acute change except as noted in the HPI.   Physical Exam Updated Vital Signs BP (!) 139/110   Pulse 64   Temp 98.8 F (37.1 C) (Oral)   Resp 18   SpO2 97%  Physical Exam  Constitutional: She is oriented to person, place, and time. She appears well-developed and well-nourished.  Patient is alert and nontoxic.  No distress at rest.  Obesity.  Pain is exacerbated with bringing the stretcher up or down or asking her to roll on her side.  HENT:  Head: Normocephalic and atraumatic.  Eyes: EOM are normal.  Cardiovascular: Normal rate, regular rhythm, normal heart sounds and intact distal pulses.  Pulmonary/Chest: Effort normal and breath sounds normal.  Abdominal: Soft. She exhibits no distension. There is no tenderness. There is no guarding.  Musculoskeletal: She exhibits no edema.  Some reproducible pain over the right SI joint.  Pain is exquisitely reproducible with  patient's attempt to roll on her side or significant position changing from supine to upright or vice versa.  Positive right straight leg raise at 30 degrees.  No peripheral edema, soft tissue abnormality or calf tenderness.  Dorsalis pedis pulses 2+ symmetric.  Neurological: She is alert and oriented to person, place, and time. No cranial nerve deficit. Coordination normal.  Patient is able to hold her right leg off of the bed without assistance.  She cannot resist downward pressure secondary to pain.  Normal dorsiflexion and extension at the foot.  Endorses some decreased sensation to light touch on the foot.  Skin: Skin is warm and dry.  Psychiatric: She has a normal mood and affect.     ED Treatments / Results  Labs (all labs ordered are listed, but only abnormal results are displayed) Labs Reviewed - No data to display  EKG None  Radiology No results found.  Procedures Procedures (including critical care time)  Medications Ordered in ED Medications  dexamethasone (DECADRON) injection 10 mg (10 mg Intramuscular Given 04/03/18 0841)  HYDROmorphone (DILAUDID) injection 1 mg (1 mg Intramuscular Given 04/03/18 0841)  orphenadrine (NORFLEX) 12 hr tablet 100 mg (100 mg Oral Given 04/03/18 2725)     Initial Impression / Assessment and Plan / ED Course  I have reviewed the triage vital signs and the nursing notes.  Pertinent labs & imaging results that were available during my care of the patient were reviewed by me and considered in my medical decision making (see chart for details).  Clinical Course as of Apr 03 1104  Fri Apr 03, 2018  1021 Patient reports pain has improved.  She states she still has pain in her back but seems to be improving.  Will attempt ambulation.   [MP]  3664 Patient up and ambulated with a rolling walker with coordinated and steady gait.  Pain much improved.  Mental status clear and alert.   [MP]    Clinical Course User Index [MP] Charlesetta Shanks, MD    Patient presents with classic sciatica presentation.  She has had chronic and recurrent pain for about a year.  It however got much worse over the past day including numbness and tingling of her foot.  Patient strength is intact.  Patient adamantly refused MRI stating she has way to severe claustrophobia.  I did offer mild sedation with Ativan however she advised that would not be sufficient and she had no intention of getting an MRI.  Patient is counseled that she must follow-up with her doctor for response to treatment.  If symptoms are not significantly improving or there is any worsening she will need to discuss scheduling an open MRI.  Patient was treated for pain with good result.  She has now been able to stand and ambulate.  I have  provided a rolling walker for some additional stability due to patient's age and potential for fall with pain or sciatica.  She did very well using a walker in a coordinated fashion.  Patient was given Decadron IM.  She is to start a short burst of prednisone in 2 days.  She is given Vicodin and Norflex to use for pain control.  Final Clinical Impressions(s) / ED Diagnoses   Final diagnoses:  Sciatica of right side    ED Discharge Orders         Ordered    predniSONE (DELTASONE) 20 MG tablet     04/03/18 1059    HYDROcodone-acetaminophen (NORCO/VICODIN) 5-325 MG tablet  Every 6 hours PRN     04/03/18 1059    orphenadrine (NORFLEX) 100 MG tablet  2 times daily     04/03/18 1059           Charlesetta Shanks, MD 04/03/18 1107

## 2018-04-03 NOTE — ED Notes (Signed)
Pt denies pain while sitting still. Pain increases with sitting up.

## 2018-04-03 NOTE — ED Notes (Signed)
ED Provider at bedside. 

## 2018-04-03 NOTE — Discharge Instructions (Addendum)
1.  You were given a shot of Decadron in the emergency department.  This is a steroid and will continue to be effective for several days.  Start prednisone as prescribed on the morning of September 16.  You may take Vicodin as prescribed 1 to 2 tablets every 6 hours if needed for additional pain control.  Take Norflex twice a day as prescribed for muscle spasms.  If you have any tendency towards constipation, take an over-the-counter stool softener because narcotic pain medications such as Vicodin can be constipating. 2.  Call your doctor today to schedule a recheck at the beginning of the week. 3.  Return to the emergency department if you have increasing weakness, numbness, bowel or bladder dysfunction or pain not controlled by medications.

## 2018-04-03 NOTE — ED Notes (Signed)
DME delivered to patient, d/c reviewed with patient and family No further questions at this time

## 2018-04-03 NOTE — ED Triage Notes (Signed)
Per EMS, pt from home with a c/o of right sided hip pain that began this morning upon waking up. The pain radiates down her right leg. She experiences "numbness" in bilateral feet. No injuries occurred.

## 2018-04-08 DIAGNOSIS — E039 Hypothyroidism, unspecified: Secondary | ICD-10-CM | POA: Diagnosis not present

## 2018-04-08 DIAGNOSIS — I10 Essential (primary) hypertension: Secondary | ICD-10-CM | POA: Diagnosis not present

## 2018-04-08 DIAGNOSIS — M5441 Lumbago with sciatica, right side: Secondary | ICD-10-CM | POA: Diagnosis not present

## 2018-04-08 DIAGNOSIS — M5431 Sciatica, right side: Secondary | ICD-10-CM | POA: Diagnosis not present

## 2018-04-08 DIAGNOSIS — I2510 Atherosclerotic heart disease of native coronary artery without angina pectoris: Secondary | ICD-10-CM | POA: Diagnosis not present

## 2018-04-08 DIAGNOSIS — E785 Hyperlipidemia, unspecified: Secondary | ICD-10-CM | POA: Diagnosis not present

## 2018-04-11 ENCOUNTER — Emergency Department (HOSPITAL_COMMUNITY): Payer: Medicare Other

## 2018-04-11 ENCOUNTER — Emergency Department (HOSPITAL_COMMUNITY)
Admission: EM | Admit: 2018-04-11 | Discharge: 2018-04-11 | Disposition: A | Discharge status: Home / Self Care | Payer: Medicare Other | Attending: Emergency Medicine | Admitting: Emergency Medicine

## 2018-04-11 ENCOUNTER — Encounter (HOSPITAL_COMMUNITY): Payer: Self-pay | Admitting: Emergency Medicine

## 2018-04-11 ENCOUNTER — Other Ambulatory Visit: Payer: Self-pay

## 2018-04-11 DIAGNOSIS — R0602 Shortness of breath: Secondary | ICD-10-CM | POA: Diagnosis not present

## 2018-04-11 DIAGNOSIS — Z7982 Long term (current) use of aspirin: Secondary | ICD-10-CM | POA: Insufficient documentation

## 2018-04-11 DIAGNOSIS — R0600 Dyspnea, unspecified: Secondary | ICD-10-CM

## 2018-04-11 DIAGNOSIS — Z79899 Other long term (current) drug therapy: Secondary | ICD-10-CM | POA: Insufficient documentation

## 2018-04-11 DIAGNOSIS — R5383 Other fatigue: Secondary | ICD-10-CM

## 2018-04-11 DIAGNOSIS — Z87891 Personal history of nicotine dependence: Secondary | ICD-10-CM | POA: Diagnosis not present

## 2018-04-11 DIAGNOSIS — R0609 Other forms of dyspnea: Secondary | ICD-10-CM

## 2018-04-11 DIAGNOSIS — E860 Dehydration: Secondary | ICD-10-CM | POA: Diagnosis not present

## 2018-04-11 DIAGNOSIS — R05 Cough: Secondary | ICD-10-CM | POA: Diagnosis not present

## 2018-04-11 LAB — URINALYSIS, MICROSCOPIC (REFLEX)

## 2018-04-11 LAB — BASIC METABOLIC PANEL
Anion gap: 13 (ref 5–15)
BUN: 11 mg/dL (ref 8–23)
CO2: 27 mmol/L (ref 22–32)
Calcium: 9.5 mg/dL (ref 8.9–10.3)
Chloride: 97 mmol/L — ABNORMAL LOW (ref 98–111)
Creatinine, Ser: 1.28 mg/dL — ABNORMAL HIGH (ref 0.44–1.00)
GFR calc Af Amer: 45 mL/min — ABNORMAL LOW (ref >60)
GFR calc non Af Amer: 38 mL/min — ABNORMAL LOW (ref >60)
Glucose, Bld: 114 mg/dL — ABNORMAL HIGH (ref 70–99)
Potassium: 3.8 mmol/L (ref 3.5–5.1)
Sodium: 137 mmol/L (ref 135–145)

## 2018-04-11 LAB — URINALYSIS, ROUTINE W REFLEX MICROSCOPIC
Glucose, UA: 100 mg/dL — AB
NITRITE: NEGATIVE
PROTEIN: 100 mg/dL — AB
Specific Gravity, Urine: 1.015 (ref 1.005–1.030)
pH: 6.5 (ref 5.0–8.0)

## 2018-04-11 LAB — CBC
HEMATOCRIT: 39.6 % (ref 36.0–46.0)
Hemoglobin: 12.8 g/dL (ref 12.0–15.0)
MCH: 31.6 pg (ref 26.0–34.0)
MCHC: 32.3 g/dL (ref 30.0–36.0)
MCV: 97.8 fL (ref 78.0–100.0)
PLATELETS: 296 10*3/uL (ref 150–400)
RBC: 4.05 MIL/uL (ref 3.87–5.11)
RDW: 12.5 % (ref 11.5–15.5)
WBC: 11 10*3/uL — ABNORMAL HIGH (ref 4.0–10.5)

## 2018-04-11 LAB — I-STAT TROPONIN, ED: Troponin i, poc: 0 ng/mL (ref 0.00–0.08)

## 2018-04-11 NOTE — Discharge Instructions (Signed)
Make sure you are eating regularly and drinking plenty of fluids.  Monitor your blood pressure to ensure that is not dropping too low with the new blood pressure medication.  Plan on following up for your MRI and cardiology office visit as planned.

## 2018-04-11 NOTE — ED Provider Notes (Signed)
[facility] EMERGENCY DEPARTMENT Provider Note   CSN: [id number] Arrival date & time: 04/11/18  4627     History   Chief Complaint Chief Complaint  Patient presents with  . Multiple Complaints    HPI Brenda Barry is a 82 y.o. female.  Patient is a very pleasant 82 year old female with a history of hypertension, hyperlipidemia and recent diagnosis of back pain presenting today with just not feeling herself.  Patient was seen on 13 September for development of low back pain radiating into her right groin worse with walking.  At that time she was having occasional unusual sensation in her leg but no frank weakness.  Patient was given Norflex, hydrocodone and prednisone.  She states she took those medications and followed up with her doctor this week and has an open MRI scheduled due to her history of claustrophobia.  She finished the prednisone but has stopped taking the hydrocodone and Norflex approximately 6 days ago.  Her doctor started her on gabapentin which she has taken 2 doses of over the last few days.  She states approximately 4 days ago she is just started having sensations of not feeling good.  She states that she started thinking about not being around anymore or not being able to drive her car or walk.  She states she did not have the energy to go do anything that she normally does like to go outside.  In the morning she did wake up and feel a thickness and almost swelling of her tongue which would usually resolve after about 20 minutes.  These had shortness of breath for over a year per her daughter but she felt like her breathing was worse in the last 4 days.  She denies any chest pain, abdominal pain, nausea or vomiting.  She has had no urinary symptoms.  She states her leg actually feels much better than what it had last week after the medications.  She has recently changed blood pressure medications approximately 3 weeks ago and changed to cholesterol  medications.  She denies diffuse body aches or muscle pain.  She states she has suffered from depression in the past but this does not specifically feel like depression however she does have a lot going on in her home.  Her husband died approximately 1 year ago and her schizophrenic daughter still lives with her so states this is always an ongoing stress but nothing specifically new recently.  The history is provided by the patient and a relative.    History reviewed. No pertinent past medical history.  There are no active problems to display for this patient.   History reviewed. No pertinent surgical history.   OB History   None      Home Medications    Prior to Admission medications   Medication Sig Start Date End Date Taking? Authorizing Provider  albuterol (PROVENTIL HFA;VENTOLIN HFA) 108 (90 Base) MCG/ACT inhaler Inhale 2 puffs into the lungs as needed. 11/24/17  Yes [provider]  aspirin 81 MG chewable tablet Chew by mouth.   Yes [provider]  CRESTOR 20 MG tablet Take 20 mg by mouth at bedtime. 02/24/18  Yes [provider]  gabapentin (NEURONTIN) 100 MG capsule Take 100 mg by mouth 3 (three) times daily as needed. 04/08/18  Yes [provider]  HYDROcodone-acetaminophen (NORCO/VICODIN) 5-325 MG tablet Take 1-2 tablets by mouth every 6 (six) hours as needed for moderate pain or severe pain. 04/03/18  Yes Pfeiffer, [place],  MD  latanoprost (XALATAN) 0.005 % ophthalmic solution Place 1 drop into both eyes at bedtime. 03/25/18  Yes [provider]  metoprolol succinate (TOPROL-XL) 100 MG 24 hr tablet Take 100 mg by mouth 2 (two) times daily.  03/19/17  Yes [provider]  SYMBICORT 160-4.5 MCG/ACT inhaler Inhale 2 puffs into the lungs 2 (two) times daily. 04/10/18  Yes [provider]  SYNTHROID 112 MCG tablet Take 112 mcg by mouth daily before breakfast.  03/19/17  Yes [provider]  valsartan-hydrochlorothiazide  (DIOVAN-HCT) 320-12.5 MG tablet Take 1 tablet by mouth daily. 03/17/18  Yes [provider]  orphenadrine (NORFLEX) 100 MG tablet Take 1 tablet (100 mg total) by mouth 2 (two) times daily. Patient not taking: Reported on 04/11/2018 04/03/18   Charlesetta Shanks, MD  predniSONE (DELTASONE) 20 MG tablet 2 tabs po daily x 3 days start date 9/16 Patient not taking: Reported on 04/11/2018 04/06/18   Charlesetta Shanks, MD    Family History No family history on file.  Social History Social History   Tobacco Use  . Smoking status: Former Smoker    Types: Cigarettes    Last attempt to quit: 04/19/1971    Years since quitting: 47.0  . Smokeless tobacco: Never Used  Substance Use Topics  . Alcohol use: No  . Drug use: No     Allergies   Atorvastatin and Crestor [rosuvastatin]   Review of Systems Review of Systems  All other systems reviewed and are negative.    Physical Exam Updated Vital Signs BP (!) 117/98   Pulse 73   Temp 98.6 F (37 C) (Oral)   Resp 20   SpO2 100%   Physical Exam  Constitutional: She is oriented to person, place, and time. She appears well-developed and well-nourished. No distress.  HENT:  Head: Normocephalic and atraumatic.  Mouth/Throat: Oropharynx is clear and moist.  Eyes: Pupils are equal, round, and reactive to light. Conjunctivae and EOM are normal.  Neck: Normal range of motion. Neck supple.  Cardiovascular: Normal rate, regular rhythm and intact distal pulses.  No murmur heard. Pulmonary/Chest: Effort normal and breath sounds normal. No respiratory distress. She has no wheezes. She has no rales.  Abdominal: Soft. She exhibits no distension. There is no tenderness. There is no rebound and no guarding.  Musculoskeletal: Normal range of motion. She exhibits no edema or tenderness.  Mild tenderness with external rotation of the hip.  No lower extremity edema noted.  Neurological: She is alert and oriented to person, place, and time.  Skin: Skin  is warm and dry. No rash noted. No erythema.  Psychiatric: She has a normal mood and affect. Her behavior is normal.  Nursing note and vitals reviewed.    ED Treatments / Results  Labs (all labs ordered are listed, but only abnormal results are displayed) Labs Reviewed  BASIC METABOLIC PANEL - Abnormal; Notable for the following components:      Result Value   Chloride 97 (*)    Glucose, Bld 114 (*)    Creatinine, Ser 1.28 (*)    GFR calc non Af Amer 38 (*)    GFR calc Af Amer 45 (*)    All other components within normal limits  CBC - Abnormal; Notable for the following components:   WBC 11.0 (*)    All other components within normal limits  URINALYSIS, ROUTINE W REFLEX MICROSCOPIC - Abnormal; Notable for the following components:   Color, Urine YELLOW (*)    APPearance  CLOUDY (*)    Glucose, UA 100 (*)    Hgb urine dipstick TRACE (*)    Bilirubin Urine SMALL (*)    Ketones, ur >80 (*)    Protein, ur 100 (*)    Leukocytes, UA MODERATE (*)    All other components within normal limits  URINALYSIS, MICROSCOPIC (REFLEX) - Abnormal; Notable for the following components:   Bacteria, UA RARE (*)    All other components within normal limits  URINE CULTURE  I-STAT TROPONIN, ED  I-STAT TROPONIN, ED    EKG EKG Interpretation  Date/Time:  Saturday April 11 2018 11:02:48 EDT Ventricular Rate:  79 PR Interval:    QRS Duration: 81 QT Interval:  379 QTC Calculation: 435 R Axis:   52 Text Interpretation:  Sinus rhythm Ventricular premature complex Anteroseptal infarct, old No significant change since last tracing Confirmed by Blanchie Dessert [phone]) on 04/11/2018 11:57:29 AM   Radiology Dg Chest 2 View  Result Date: 04/11/2018 CLINICAL DATA:  Shortness of breath.  Productive cough. EXAM: CHEST - 2 VIEW COMPARISON:  03/21/2016 FINDINGS: The heart size and mediastinal contours are within normal limits. Both lungs are clear. The visualized skeletal structures are unremarkable.  Coronary artery stent in place. IMPRESSION: No acute abnormalities. Electronically Signed   By: Lorriane Shire M.D.   On: 04/11/2018 07:39    Procedures Procedures (including critical care time)  Medications Ordered in ED Medications - No data to display   Initial Impression / Assessment and Plan / ED Course  I have reviewed the triage vital signs and the nursing notes.  Pertinent labs & imaging results that were available during my care of the patient were reviewed by me and considered in my medical decision making (see chart for details).    Patient presenting today with vague symptoms of not feeling well.  Symptoms are all nonspecific.  No focal findings on exam.  She has some mild pain in her hip but per patient's report it is significantly improved from what it was 1 week ago.  Concerned that patient's symptoms may be related to recent medications with muscle relaxer and pain medication.  Also concerned that she may be having some of her complaints related to the new blood pressure medication she is taking.  Upon initial evaluation blood pressure was moderately elevated and after taking her medication was 105/82.  Patient denies any symptoms concerning for ACS and EKG and troponin today are within normal limits.  Patient's UA appears to have signs of dehydration with greater than 80 ketones but no evidence of infection as it is a contaminated sample.  Patient's renal function is mildly elevated today at 1.28 from 1.1 for just a few weeks ago.  This also could be related to the recent start of her valsartan, HCTZ.  No evidence of anemia or other acute findings today.  Chest x-ray within normal limits.  Patient is able to ambulate here without difficulty.  Recommended she increase her fluid intake plan on following up with cardiology, PCP and MRI as planned.  Questions were asked and patient and family are comfortable going home.  Final Clinical Impressions(s) / ED Diagnoses   Final diagnoses:   Fatigue, unspecified type  Exertional dyspnea  Dehydration    ED Discharge Orders    None       Blanchie Dessert, MD 04/11/18 1314

## 2018-04-11 NOTE — ED Notes (Signed)
Pt given water to drink per Dr. Maryan Rued

## 2018-04-11 NOTE — ED Triage Notes (Signed)
Pt from home. States she was seen on the 13th for sciatica and did not elected to stay for the MRI d/t not being able to tolerate closed in spaces. Pt states she did see primary care since then and has scheduled open MRI. Pt states she was prescribed gabapentin but only took "a couple".  Also endorses feelings that "she is not right, that she is turning into someone else, whoever that may be" Endorses shob and productive cough. States her tongue feels dry.

## 2018-04-11 NOTE — ED Notes (Signed)
Pt ambulatory to RR w steady gait

## 2018-04-17 DIAGNOSIS — E785 Hyperlipidemia, unspecified: Secondary | ICD-10-CM | POA: Insufficient documentation

## 2018-04-17 DIAGNOSIS — F418 Other specified anxiety disorders: Secondary | ICD-10-CM | POA: Diagnosis not present

## 2018-04-17 DIAGNOSIS — I10 Essential (primary) hypertension: Secondary | ICD-10-CM | POA: Diagnosis not present

## 2018-04-17 DIAGNOSIS — M5441 Lumbago with sciatica, right side: Secondary | ICD-10-CM | POA: Diagnosis not present

## 2018-04-17 DIAGNOSIS — M5431 Sciatica, right side: Secondary | ICD-10-CM | POA: Diagnosis not present

## 2018-04-21 DIAGNOSIS — I129 Hypertensive chronic kidney disease with stage 1 through stage 4 chronic kidney disease, or unspecified chronic kidney disease: Secondary | ICD-10-CM | POA: Diagnosis not present

## 2018-04-21 DIAGNOSIS — D472 Monoclonal gammopathy: Secondary | ICD-10-CM | POA: Diagnosis not present

## 2018-04-21 DIAGNOSIS — N183 Chronic kidney disease, stage 3 (moderate): Secondary | ICD-10-CM | POA: Diagnosis not present

## 2018-04-21 DIAGNOSIS — Z6833 Body mass index (BMI) 33.0-33.9, adult: Secondary | ICD-10-CM | POA: Diagnosis not present

## 2018-04-22 ENCOUNTER — Encounter: Payer: Self-pay | Admitting: Podiatry

## 2018-04-22 ENCOUNTER — Ambulatory Visit (INDEPENDENT_AMBULATORY_CARE_PROVIDER_SITE_OTHER): Payer: Medicare Other | Admitting: Podiatry

## 2018-04-22 DIAGNOSIS — M79675 Pain in left toe(s): Secondary | ICD-10-CM | POA: Diagnosis not present

## 2018-04-22 DIAGNOSIS — B351 Tinea unguium: Secondary | ICD-10-CM

## 2018-04-22 DIAGNOSIS — M79674 Pain in right toe(s): Secondary | ICD-10-CM | POA: Diagnosis not present

## 2018-04-22 NOTE — Progress Notes (Signed)
Complaint:  Visit Type: Patient returns to my office for continued preventative foot care services. Complaint: Patient states" my nails have grown long and thick and become painful to walk and wear shoes"The patient presents for preventative foot care services. No changes to ROS  Podiatric Exam: Vascular: dorsalis pedis and posterior tibial pulses are palpable bilateral. Capillary return is immediate. Temperature gradient is WNL. Skin turgor WNL  Sensorium: Normal Semmes Weinstein monofilament test. Normal tactile sensation bilaterally. Nail Exam: Pt has thick disfigured discolored nails with subungual debris noted bilateral entire nail hallux through fifth toenails Ulcer Exam: There is no evidence of ulcer or pre-ulcerative changes or infection. Orthopedic Exam: Muscle tone and strength are WNL. No limitations in general ROM. No crepitus or effusions noted. Foot type and digits show no abnormalities. HAV  B/L. Skin: No Porokeratosis. No infection or ulcers  Diagnosis:  Onychomycosis, , Pain in right toe, pain in left toes  Treatment & Plan Procedures and Treatment: Consent by patient was obtained for treatment procedures.   Debridement of mycotic and hypertrophic toenails, 1 through 5 bilateral and clearing of subungual debris. No ulceration, no infection noted. ABN signed for 2019. Return Visit-Office Procedure: Patient instructed to return to the office for a follow up visit 3 months for continued evaluation and treatment.    Gardiner Barefoot DPM

## 2018-04-23 DIAGNOSIS — M47816 Spondylosis without myelopathy or radiculopathy, lumbar region: Secondary | ICD-10-CM | POA: Diagnosis not present

## 2018-04-23 DIAGNOSIS — M5127 Other intervertebral disc displacement, lumbosacral region: Secondary | ICD-10-CM | POA: Diagnosis not present

## 2018-04-23 DIAGNOSIS — M5116 Intervertebral disc disorders with radiculopathy, lumbar region: Secondary | ICD-10-CM | POA: Diagnosis not present

## 2018-04-27 DIAGNOSIS — I2510 Atherosclerotic heart disease of native coronary artery without angina pectoris: Secondary | ICD-10-CM | POA: Diagnosis not present

## 2018-04-27 DIAGNOSIS — I10 Essential (primary) hypertension: Secondary | ICD-10-CM | POA: Diagnosis not present

## 2018-04-27 DIAGNOSIS — M5431 Sciatica, right side: Secondary | ICD-10-CM | POA: Diagnosis not present

## 2018-04-27 DIAGNOSIS — I739 Peripheral vascular disease, unspecified: Secondary | ICD-10-CM | POA: Diagnosis not present

## 2018-04-30 DIAGNOSIS — I10 Essential (primary) hypertension: Secondary | ICD-10-CM | POA: Diagnosis not present

## 2018-04-30 DIAGNOSIS — M5431 Sciatica, right side: Secondary | ICD-10-CM | POA: Diagnosis not present

## 2018-04-30 DIAGNOSIS — E7800 Pure hypercholesterolemia, unspecified: Secondary | ICD-10-CM | POA: Diagnosis not present

## 2018-05-06 DIAGNOSIS — I2510 Atherosclerotic heart disease of native coronary artery without angina pectoris: Secondary | ICD-10-CM | POA: Diagnosis not present

## 2018-05-08 DIAGNOSIS — I2510 Atherosclerotic heart disease of native coronary artery without angina pectoris: Secondary | ICD-10-CM | POA: Diagnosis not present

## 2018-05-14 DIAGNOSIS — R0989 Other specified symptoms and signs involving the circulatory and respiratory systems: Secondary | ICD-10-CM | POA: Diagnosis not present

## 2018-05-15 DIAGNOSIS — I739 Peripheral vascular disease, unspecified: Secondary | ICD-10-CM | POA: Diagnosis not present

## 2018-05-18 NOTE — Progress Notes (Signed)
HEMATOLOGY/ONCOLOGY CONSULTATION NOTE  Date of Service: 05/19/2018  Patient Care Team: Chesley Noon, MD as PCP - General (Family Medicine)  CHIEF COMPLAINTS/PURPOSE OF CONSULTATION:  Monoclonal Paraproteinemia   HISTORY OF PRESENTING ILLNESS:  Brenda Barry is a wonderful 82 y.o. female who has been referred to [place] by Dr. Anastasia Pall for evaluation and management of Monoclonal Paraproteinemia. The pt reports that she is doing well overall.   The pt reports that she has had sciatica in the last year with referred pain down her leg, caused by a pinched nerve at L4/5.  The pt is intending to see Dr. Leonel Ramsay in neurosurgery soon. The pt's lower back pain has been evaluated with an MRI most recently on 04/24/18. The pt is also being evaluated by cardiology.  The pt denies any neck pain whatsoever and denies any new bone pains.   The pt notes that her kidney functions have been stable recently and has been pursuing care with Dr. Erling Cruz at [facility]. The pt notes that her blood pressure has been high, and is elevated more when she sees physicians. She has taken HCTZ, Toprol-XL, and Hydralazine. The pt notes that her thyroid medication has recently been adjusted.   Of note prior to the patient's presentation, the pt had a Bone Survey on 01/03/17 which revealed No definite lytic lesions within the axial skeleton. 2. Mixed lytic and blastic lesion posteriorly at C7. CT or MRI could be used for further evaluation of the cervical spine. 3. 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 No discrete lytic lesions are present.   Subsequently the pt then had a CT Cervical Spine on 02/21/17 which revealed CT scan does not show a convincing abnormality within the C7 vertebral body. However, within the posterior C3 vertebral body, there is a well-circumscribed lucent area measuring 8 x 4 x 5 mm which, though not specific or  conclusive, could represent a focus of myeloma.  Most recent lab results (04/21/18) of CMP is as follows: all values are WNL except for Glucose at 104, Creatinine at 1.07, GFR at 57, BUN at 10, Sodium at 145, Phosphorous at 2.2. 04/21/18 SPEP revealed all values WNL except for M-spike at 0.7, with IgG monoclonal protein with kappa light chain specificity.   On review of systems, pt reports sciatica, stable energy levels, stable lower back pain, and denies any neck pain, new bone pains, abdominal pains, chest wall pain, and any other symptoms.   On PMHx the pt reports Osteoporosis, Hypothyroidism, Hyperlipidemia, CAD with 5p stent place in late 90s, benign lumpectomy of right breast, HTN, IgG monoclonal gammopathy, CKD stage III. On Social Hx the pt reports that she quit smoking cigarettes about 50 years ago.   MEDICAL HISTORY:  HTN HLD Anxiety Sciatica  SURGICAL HISTORY: No past surgical history on file.  SOCIAL HISTORY: Social History   Socioeconomic History  . Marital status: Married    Spouse name: Not on file  . Number of children: Not on file  . Years of education: Not on file  . Highest education level: Not on file  Occupational History  . Not on file  Social Needs  . Financial resource strain: Not on file  . Food insecurity:    Worry: Not on file    Inability: Not on file  . Transportation needs:    Medical: Not on file    Non-medical: Not on file  Tobacco Use  . Smoking  status: Former Smoker    Types: Cigarettes    Last attempt to quit: 04/19/1971    Years since quitting: 47.1  . Smokeless tobacco: Never Used  Substance and Sexual Activity  . Alcohol use: No  . Drug use: No  . Sexual activity: Not on file  Lifestyle  . Physical activity:    Days per week: Not on file    Minutes per session: Not on file  . Stress: Not on file  Relationships  . Social connections:    Talks on phone: Not on file    Gets together: Not on file    Attends religious service:  Not on file    Active member of club or organization: Not on file    Attends meetings of clubs or organizations: Not on file    Relationship status: Not on file  . Intimate partner violence:    Fear of current or ex partner: Not on file    Emotionally abused: Not on file    Physically abused: Not on file    Forced sexual activity: Not on file  Other Topics Concern  . Not on file  Social History Narrative  . Not on file    FAMILY HISTORY: No family history on file.  ALLERGIES:  is allergic to atorvastatin and rosuvastatin.  MEDICATIONS:  Current Outpatient Medications  Medication Sig Dispense Refill  . albuterol (PROVENTIL HFA;VENTOLIN HFA) 108 (90 Base) MCG/ACT inhaler Inhale 2 puffs into the lungs as needed.    Marland Kitchen aspirin 81 MG chewable tablet Chew by mouth.    . CRESTOR 20 MG tablet Take 20 mg by mouth at bedtime.    . gabapentin (NEURONTIN) 100 MG capsule Take 100 mg by mouth 3 (three) times daily as needed.  2  . hydrochlorothiazide (HYDRODIURIL) 25 MG tablet TAKE 1 TABLET(25 MG) BY MOUTH EVERY DAY IN THE MORNING    . latanoprost (XALATAN) 0.005 % ophthalmic solution Place 1 drop into both eyes at bedtime.    . metoprolol succinate (TOPROL-XL) 100 MG 24 hr tablet Take 100 mg by mouth 2 (two) times daily.     . SYMBICORT 160-4.5 MCG/ACT inhaler Inhale 2 puffs into the lungs 2 (two) times daily.    Marland Kitchen SYNTHROID 112 MCG tablet Take 112 mcg by mouth daily before breakfast.      No current facility-administered medications for this visit.     REVIEW OF SYSTEMS:    10 Point review of Systems was done is negative except as noted above.  PHYSICAL EXAMINATION:  . Vitals:   05/19/18 0934  BP: (!) 218/98  Pulse: 71  Resp: 18  Temp: 98.4 F (36.9 C)  SpO2: 98%   Filed Weights   05/19/18 0934  Weight: 226 lb 6.4 oz (102.7 kg)   .Body mass index is 37.1 kg/m.  GENERAL:alert, in no acute distress and comfortable SKIN: no acute rashes, no significant lesions EYES:  conjunctiva are pink and non-injected, sclera anicteric OROPHARYNX: MMM, no exudates, no oropharyngeal erythema or ulceration NECK: supple, no JVD LYMPH:  no palpable lymphadenopathy in the cervical, axillary or inguinal regions LUNGS: clear to auscultation b/l with normal respiratory effort HEART: regular rate & rhythm ABDOMEN:  normoactive bowel sounds , non tender, not distended. Extremity: no pedal edema PSYCH: alert & oriented x 3 with fluent speech NEURO: no focal motor/sensory deficits  LABORATORY DATA:  I have reviewed the data as listed  . CBC Latest Ref Rng & Units 04/11/2018  WBC 4.0 - 10.5  K/uL 11.0(H)  Hemoglobin 12.0 - 15.0 g/dL 12.8  Hematocrit 36.0 - 46.0 % 39.6  Platelets 150 - 400 K/uL 296    . CMP Latest Ref Rng & Units 04/11/2018  Glucose 70 - 99 mg/dL 114(H)  BUN 8 - 23 mg/dL 11  Creatinine 0.44 - 1.00 mg/dL 1.28(H)  Sodium 135 - 145 mmol/L 137  Potassium 3.5 - 5.1 mmol/L 3.8  Chloride 98 - 111 mmol/L 97(L)  CO2 22 - 32 mmol/L 27  Calcium 8.9 - 10.3 mg/dL 9.5   04/17/18 CBC:    04/21/18 CMP:    04/21/18 SPEP:   04/21/18 Immunofixation:    RADIOGRAPHIC STUDIES: I have personally reviewed the radiological images as listed and agreed with the findings in the report. No results found.  ASSESSMENT & PLAN:   82 y.o. female with  1. MGUS- Monoclonal Gammopathy or undetermined significance. PLAN -Discussed patient's most recent labs from 04/21/18, Creatinine at 1.07, no anemia with HGB at 12.4, normal calcium at 9.5, M Protein at 0.7 IgG kappa specificity  -Discussed the 01/03/17 Bone Survey which revealed No definite lytic lesions within the axial skeleton. 2. Mixed lytic and blastic lesion posteriorly at C7. CT or MRI could be used for further evaluation of the cervical spine. 3. 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 No discrete lytic lesions are present.  -Discussed the 02/21/17  CT Cervical Spine which revealed CT scan does not show a convincing abnormality within the C7 vertebral body. However, within the posterior C3 vertebral body, there is a well-circumscribed lucent area measuring 8 x 4 x 5 mm which, though not specific or conclusive, could represent a focus of myeloma.  -Also reviewed the 04/24/18 MRI Lumbar Spine which revealed degenerative disc disease, multilevel disc bulges and disc osteophytes and advised that the pt speak with her PCP regarding 2.9cm cystic lesion in right adnexa  -Discussed the CRAB criteria. The pt has known CKD related to her HTN and thus explained elevated renal function with Creatinine most recently at 1.16. No anemia. Normal calcium. No neck pains whatsoever at the only location where some concern was expressed on radiographic imaging above.  -Discussed that the patient's clinical presentation is not overtly worrisome for myeloma and presentation is consistent with MGUS. -Discussed that if the pt develops new neck pains, she should let myself of her PCP know as this would be indication to repeat imaging  -Will order blood tests today -Will order 24 hour urine study  -No current indication for a BM Bx at this time -Would repeat Bone Survey if new symptoms present -Will see the pt back in 4 months   2. HTN -chronically uncontrolled . Element of white coat hypertension. Patient notes BP much better at home. No CP/SOB or headaches. Rpt BP in clinic 170/84 PLAN -continue compliance with anti HTN -continue f/u with PCP to optimize BP control   RTC with Dr Irene Limbo with labs in 4 months. (Please schedule labs 7 days prior to clinic visit)   All of the patients questions were answered with apparent satisfaction. The patient knows to call the clinic with any problems, questions or concerns.  The total time spent in the appt was 45 minutes and more than 50% was on counseling and direct patient cares.    Sullivan Lone MD [place] AAHIVMS [facility]  [facility] Hematology/Oncology Physician [facility]  (Office):       [phone] (Work cell):  [phone] (Fax):  [phone]  05/19/2018 10:37 AM  I, Baldwin [place], am acting as a scribe for Dr. Irene Limbo  .I have reviewed the above documentation for accuracy and completeness, and I agree with the above. Brenda Genera MD

## 2018-05-19 ENCOUNTER — Encounter: Payer: Self-pay | Admitting: Hematology

## 2018-05-19 ENCOUNTER — Inpatient Hospital Stay: Payer: Medicare Other | Attending: Hematology | Admitting: Hematology

## 2018-05-19 ENCOUNTER — Telehealth: Payer: Self-pay | Admitting: Hematology

## 2018-05-19 VITALS — BP 218/98 | HR 71 | Temp 98.4°F | Resp 18 | Ht 65.5 in | Wt 226.4 lb

## 2018-05-19 DIAGNOSIS — N183 Chronic kidney disease, stage 3 (moderate): Secondary | ICD-10-CM | POA: Diagnosis not present

## 2018-05-19 DIAGNOSIS — D472 Monoclonal gammopathy: Secondary | ICD-10-CM

## 2018-05-19 DIAGNOSIS — Z87891 Personal history of nicotine dependence: Secondary | ICD-10-CM | POA: Diagnosis not present

## 2018-05-19 DIAGNOSIS — I1310 Hypertensive heart and chronic kidney disease without heart failure, with stage 1 through stage 4 chronic kidney disease, or unspecified chronic kidney disease: Secondary | ICD-10-CM | POA: Diagnosis not present

## 2018-05-19 DIAGNOSIS — I2510 Atherosclerotic heart disease of native coronary artery without angina pectoris: Secondary | ICD-10-CM

## 2018-05-19 DIAGNOSIS — M810 Age-related osteoporosis without current pathological fracture: Secondary | ICD-10-CM

## 2018-05-19 DIAGNOSIS — E039 Hypothyroidism, unspecified: Secondary | ICD-10-CM | POA: Diagnosis not present

## 2018-05-19 NOTE — Patient Instructions (Signed)
Thank you for choosing [facility] to provide your oncology and hematology care.  To afford each patient quality time with our providers, please arrive 30 minutes before your scheduled appointment time.  If you arrive late for your appointment, you may be asked to reschedule.  We strive to give you quality time with our providers, and arriving late affects you and other patients whose appointments are after yours.    If you are a no show for multiple scheduled visits, you may be dismissed from the clinic at the providers discretion.     Again, thank you for choosing [facility], our hope is that these requests will decrease the amount of time that you wait before being seen by our physicians.  ______________________________________________________________________   Should you have questions after your visit to the [facility], please contact our office at [phone] between the hours of 8:30 and 4:30 p.m.    Voicemails left after 4:30p.m will not be returned until the following business day.     For prescription refill requests, please have your pharmacy contact us directly.  Please also try to allow 48 hours for prescription requests.     Please contact the scheduling department for questions regarding scheduling.  For scheduling of procedures such as PET scans, CT scans, MRI, Ultrasound, etc please contact central scheduling at [phone].     Resources For Cancer Patients and Caregivers:    Oncolink.org:  A wonderful resource for patients and healthcare providers for information regarding your disease, ways to tract your treatment, what to expect, etc.      American Cancer Society:  800-227-2345  Can help patients locate various types of support and financial assistance   Cancer Care: 1-800-813-HOPE (4673) Provides financial assistance, online support groups, medication/co-pay assistance.     [place] DSS:  336-641-3447 Where to apply  for food stamps, Medicaid, and utility assistance   Medicare Rights Center: 800-333-4114 Helps people with Medicare understand their rights and benefits, navigate the Medicare system, and secure the quality healthcare they deserve   SCAT: 336-333-6589 [place] [employer]'s shared-ride transportation service for eligible riders who have a disability that prevents them from riding the fixed route bus.     For additional information on assistance programs please contact our social worker:   Abigail Elmore:  [phone]  

## 2018-05-19 NOTE — Telephone Encounter (Signed)
Gave pt avs and calendar  °

## 2018-05-25 DIAGNOSIS — M5431 Sciatica, right side: Secondary | ICD-10-CM | POA: Diagnosis not present

## 2018-05-25 DIAGNOSIS — I10 Essential (primary) hypertension: Secondary | ICD-10-CM | POA: Diagnosis not present

## 2018-05-25 DIAGNOSIS — I739 Peripheral vascular disease, unspecified: Secondary | ICD-10-CM | POA: Diagnosis not present

## 2018-05-25 DIAGNOSIS — I2510 Atherosclerotic heart disease of native coronary artery without angina pectoris: Secondary | ICD-10-CM | POA: Diagnosis not present

## 2018-05-27 DIAGNOSIS — M25551 Pain in right hip: Secondary | ICD-10-CM | POA: Diagnosis not present

## 2018-05-27 DIAGNOSIS — M48061 Spinal stenosis, lumbar region without neurogenic claudication: Secondary | ICD-10-CM | POA: Diagnosis not present

## 2018-05-27 DIAGNOSIS — M5136 Other intervertebral disc degeneration, lumbar region: Secondary | ICD-10-CM | POA: Diagnosis not present

## 2018-05-27 DIAGNOSIS — M1611 Unilateral primary osteoarthritis, right hip: Secondary | ICD-10-CM | POA: Diagnosis not present

## 2018-05-27 DIAGNOSIS — M4316 Spondylolisthesis, lumbar region: Secondary | ICD-10-CM | POA: Diagnosis not present

## 2018-05-27 DIAGNOSIS — M47816 Spondylosis without myelopathy or radiculopathy, lumbar region: Secondary | ICD-10-CM | POA: Diagnosis not present

## 2018-05-27 DIAGNOSIS — I10 Essential (primary) hypertension: Secondary | ICD-10-CM | POA: Diagnosis not present

## 2018-06-04 DIAGNOSIS — I10 Essential (primary) hypertension: Secondary | ICD-10-CM | POA: Diagnosis not present

## 2018-06-17 DIAGNOSIS — H401131 Primary open-angle glaucoma, bilateral, mild stage: Secondary | ICD-10-CM | POA: Diagnosis not present

## 2018-06-24 DIAGNOSIS — I2510 Atherosclerotic heart disease of native coronary artery without angina pectoris: Secondary | ICD-10-CM | POA: Diagnosis not present

## 2018-06-24 DIAGNOSIS — I10 Essential (primary) hypertension: Secondary | ICD-10-CM | POA: Diagnosis not present

## 2018-06-24 DIAGNOSIS — I739 Peripheral vascular disease, unspecified: Secondary | ICD-10-CM | POA: Diagnosis not present

## 2018-06-24 DIAGNOSIS — R0989 Other specified symptoms and signs involving the circulatory and respiratory systems: Secondary | ICD-10-CM | POA: Diagnosis not present

## 2018-06-26 DIAGNOSIS — I10 Essential (primary) hypertension: Secondary | ICD-10-CM | POA: Diagnosis not present

## 2018-06-26 DIAGNOSIS — M1611 Unilateral primary osteoarthritis, right hip: Secondary | ICD-10-CM | POA: Diagnosis not present

## 2018-07-01 ENCOUNTER — Telehealth: Payer: Self-pay | Admitting: Nurse Practitioner

## 2018-07-01 NOTE — Telephone Encounter (Signed)
Pt appears on [facility] Quality Report for [facility], but hasn't been seen since Feb. 2018.  I left a message asking her to call me at [phone] to confirm her PCP.  VDM (DD)

## 2018-07-06 ENCOUNTER — Telehealth: Payer: Self-pay | Admitting: Family Medicine

## 2018-07-06 NOTE — Telephone Encounter (Signed)
I spoke with the patient, and she confirmed that her PCP is Anastasia Pall with [facility]. VDM (DD)

## 2018-07-27 DIAGNOSIS — J45909 Unspecified asthma, uncomplicated: Secondary | ICD-10-CM | POA: Insufficient documentation

## 2018-07-27 DIAGNOSIS — E039 Hypothyroidism, unspecified: Secondary | ICD-10-CM | POA: Insufficient documentation

## 2018-07-27 DIAGNOSIS — I2510 Atherosclerotic heart disease of native coronary artery without angina pectoris: Secondary | ICD-10-CM | POA: Insufficient documentation

## 2018-07-27 DIAGNOSIS — R011 Cardiac murmur, unspecified: Secondary | ICD-10-CM | POA: Insufficient documentation

## 2018-07-29 ENCOUNTER — Encounter: Payer: Self-pay | Admitting: Podiatry

## 2018-07-29 ENCOUNTER — Ambulatory Visit (INDEPENDENT_AMBULATORY_CARE_PROVIDER_SITE_OTHER): Payer: Medicare Other | Admitting: Podiatry

## 2018-07-29 DIAGNOSIS — M79675 Pain in left toe(s): Secondary | ICD-10-CM

## 2018-07-29 DIAGNOSIS — M79674 Pain in right toe(s): Secondary | ICD-10-CM | POA: Diagnosis not present

## 2018-07-29 DIAGNOSIS — B351 Tinea unguium: Secondary | ICD-10-CM | POA: Diagnosis not present

## 2018-07-29 NOTE — Progress Notes (Addendum)
Complaint:  Visit Type: Patient returns to my office for continued preventative foot care services. Complaint: Patient states" my nails have grown long and thick and become painful to walk and wear shoes"The patient presents for preventative foot care services. No changes to ROS  Podiatric Exam: Vascular: dorsalis pedis and posterior tibial pulses are palpable bilateral. Capillary return is immediate. Temperature gradient is WNL. Skin turgor WNL  Sensorium: Normal Semmes Weinstein monofilament test. Normal tactile sensation bilaterally. Nail Exam: Pt has thick disfigured discolored nails with subungual debris noted bilateral entire nail hallux through fifth toenails Ulcer Exam: There is no evidence of ulcer or pre-ulcerative changes or infection. Orthopedic Exam: Muscle tone and strength are WNL. No limitations in general ROM. No crepitus or effusions noted. Foot type and digits show no abnormalities. HAV  B/L. Skin: No Porokeratosis. No infection or ulcers  Diagnosis:  Onychomycosis, , Pain in right toe, pain in left toes  Treatment & Plan Procedures and Treatment: Consent by patient was obtained for treatment procedures.   Debridement of mycotic and hypertrophic toenails, 1 through 5 bilateral and clearing of subungual debris. No ulceration, no infection noted. ABN signed for 2020. Return Visit-Office Procedure: Patient instructed to return to the office for a follow up visit 3 months for continued evaluation and treatment.    Gardiner Barefoot DPM

## 2018-08-26 DIAGNOSIS — M1611 Unilateral primary osteoarthritis, right hip: Secondary | ICD-10-CM | POA: Insufficient documentation

## 2018-08-28 ENCOUNTER — Telehealth: Payer: Self-pay | Admitting: Hematology

## 2018-08-28 NOTE — Telephone Encounter (Signed)
Patient called to reschedule  °

## 2018-09-08 ENCOUNTER — Inpatient Hospital Stay: Payer: Medicare Other | Attending: Hematology

## 2018-09-08 DIAGNOSIS — I129 Hypertensive chronic kidney disease with stage 1 through stage 4 chronic kidney disease, or unspecified chronic kidney disease: Secondary | ICD-10-CM | POA: Diagnosis not present

## 2018-09-08 DIAGNOSIS — R7989 Other specified abnormal findings of blood chemistry: Secondary | ICD-10-CM | POA: Diagnosis not present

## 2018-09-08 DIAGNOSIS — D472 Monoclonal gammopathy: Secondary | ICD-10-CM | POA: Insufficient documentation

## 2018-09-08 DIAGNOSIS — N189 Chronic kidney disease, unspecified: Secondary | ICD-10-CM | POA: Insufficient documentation

## 2018-09-08 LAB — CMP (CANCER CENTER ONLY)
ALBUMIN: 4 g/dL (ref 3.5–5.0)
ALK PHOS: 69 U/L (ref 38–126)
ALT: 13 U/L (ref 0–44)
AST: 20 U/L (ref 15–41)
Anion gap: 10 (ref 5–15)
BILIRUBIN TOTAL: 0.4 mg/dL (ref 0.3–1.2)
BUN: 15 mg/dL (ref 8–23)
CALCIUM: 9.4 mg/dL (ref 8.9–10.3)
CO2: 27 mmol/L (ref 22–32)
CREATININE: 1.23 mg/dL — AB (ref 0.44–1.00)
Chloride: 106 mmol/L (ref 98–111)
GFR, Est AFR Am: 48 mL/min — ABNORMAL LOW (ref >60)
GFR, Estimated: 41 mL/min — ABNORMAL LOW (ref >60)
GLUCOSE: 99 mg/dL (ref 70–99)
Potassium: 3.8 mmol/L (ref 3.5–5.1)
SODIUM: 143 mmol/L (ref 135–145)
TOTAL PROTEIN: 7.9 g/dL (ref 6.5–8.1)

## 2018-09-08 LAB — CBC WITH DIFFERENTIAL/PLATELET
Abs Immature Granulocytes: 0.01 10*3/uL (ref 0.00–0.07)
BASOS PCT: 1 %
Basophils Absolute: 0 10*3/uL (ref 0.0–0.1)
EOS ABS: 0.3 10*3/uL (ref 0.0–0.5)
Eosinophils Relative: 5 %
HCT: 37.9 % (ref 36.0–46.0)
Hemoglobin: 12.1 g/dL (ref 12.0–15.0)
IMMATURE GRANULOCYTES: 0 %
LYMPHS ABS: 2.3 10*3/uL (ref 0.7–4.0)
Lymphocytes Relative: 44 %
MCH: 30.3 pg (ref 26.0–34.0)
MCHC: 31.9 g/dL (ref 30.0–36.0)
MCV: 94.8 fL (ref 80.0–100.0)
MONO ABS: 0.4 10*3/uL (ref 0.1–1.0)
MONOS PCT: 7 %
NEUTROS PCT: 43 %
Neutro Abs: 2.2 10*3/uL (ref 1.7–7.7)
PLATELETS: 262 10*3/uL (ref 150–400)
RBC: 4 MIL/uL (ref 3.87–5.11)
RDW: 15.2 % (ref 11.5–15.5)
WBC: 5.2 10*3/uL (ref 4.0–10.5)
nRBC: 0 % (ref 0.0–0.2)

## 2018-09-08 LAB — LACTATE DEHYDROGENASE: LDH: 159 U/L (ref 98–192)

## 2018-09-08 LAB — SEDIMENTATION RATE: Sed Rate: 36 mm/hr — ABNORMAL HIGH (ref 0–22)

## 2018-09-09 LAB — BETA 2 MICROGLOBULIN, SERUM: Beta-2 Microglobulin: 2 mg/L (ref 0.6–2.4)

## 2018-09-09 LAB — KAPPA/LAMBDA LIGHT CHAINS
KAPPA, LAMDA LIGHT CHAIN RATIO: 2.84 — AB (ref 0.26–1.65)
Kappa free light chain: 27 mg/L — ABNORMAL HIGH (ref 3.3–19.4)
Lambda free light chains: 9.5 mg/L (ref 5.7–26.3)

## 2018-09-10 LAB — MULTIPLE MYELOMA PANEL, SERUM
ALBUMIN SERPL ELPH-MCNC: 3.7 g/dL (ref 2.9–4.4)
ALPHA 1: 0.3 g/dL (ref 0.0–0.4)
Albumin/Glob SerPl: 1.1 (ref 0.7–1.7)
Alpha2 Glob SerPl Elph-Mcnc: 0.9 g/dL (ref 0.4–1.0)
B-GLOBULIN SERPL ELPH-MCNC: 0.9 g/dL (ref 0.7–1.3)
GAMMA GLOB SERPL ELPH-MCNC: 1.4 g/dL (ref 0.4–1.8)
Globulin, Total: 3.4 g/dL (ref 2.2–3.9)
IGA: 190 mg/dL (ref 64–422)
IGG (IMMUNOGLOBIN G), SERUM: 1604 mg/dL — AB (ref 700–1600)
IgM (Immunoglobulin M), Srm: 54 mg/dL (ref 26–217)
M PROTEIN SERPL ELPH-MCNC: 0.8 g/dL — AB
Total Protein ELP: 7.1 g/dL (ref 6.0–8.5)

## 2018-09-15 ENCOUNTER — Ambulatory Visit: Payer: Medicare Other | Admitting: Hematology

## 2018-09-15 NOTE — Progress Notes (Signed)
HEMATOLOGY/ONCOLOGY CONSULTATION NOTE  Date of Service: 09/16/2018  Patient Care Team: Chesley Noon, MD as PCP - General (Family Medicine)  CHIEF COMPLAINTS/PURPOSE OF CONSULTATION:  Monoclonal Paraproteinemia   HISTORY OF PRESENTING ILLNESS:  Brenda Barry is a wonderful 83 y.o. female who has been referred to [place] by Dr. Anastasia Pall for evaluation and management of Monoclonal Paraproteinemia. The pt reports that she is doing well overall.   The pt reports that she has had sciatica in the last year with referred pain down her leg, caused by a pinched nerve at L4/5.  The pt is intending to see Dr. Leonel Ramsay in neurosurgery soon. The pt's lower back pain has been evaluated with an MRI most recently on 04/24/18. The pt is also being evaluated by cardiology.  The pt denies any neck pain whatsoever and denies any new bone pains.   The pt notes that her kidney functions have been stable recently and has been pursuing care with Dr. Erling Cruz at [facility]. The pt notes that her blood pressure has been high, and is elevated more when she sees physicians. She has taken HCTZ, Toprol-XL, and Hydralazine. The pt notes that her thyroid medication has recently been adjusted.   Of note prior to the patient's presentation, the pt had a Bone Survey on 01/03/17 which revealed No definite lytic lesions within the axial skeleton. 2. Mixed lytic and blastic lesion posteriorly at C7. CT or MRI could be used for further evaluation of the cervical spine. 3. 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 No discrete lytic lesions are present.   Subsequently the pt then had a CT Cervical Spine on 02/21/17 which revealed CT scan does not show a convincing abnormality within the C7 vertebral body. However, within the posterior C3 vertebral body, there is a well-circumscribed lucent area measuring 8 x 4 x 5 mm which, though not specific or  conclusive, could represent a focus of myeloma.  Most recent lab results (04/21/18) of CMP is as follows: all values are WNL except for Glucose at 104, Creatinine at 1.07, GFR at 57, BUN at 10, Sodium at 145, Phosphorous at 2.2. 04/21/18 SPEP revealed all values WNL except for M-spike at 0.7, with IgG monoclonal protein with kappa light chain specificity.   On review of systems, pt reports sciatica, stable energy levels, stable lower back pain, and denies any neck pain, new bone pains, abdominal pains, chest wall pain, and any other symptoms.   On PMHx the pt reports Osteoporosis, Hypothyroidism, Hyperlipidemia, CAD with 5p stent place in late 90s, benign lumpectomy of right breast, HTN, IgG monoclonal gammopathy, CKD stage III. On Social Hx the pt reports that she quit smoking cigarettes about 50 years ago.  Interval History:   Brenda Barry returns today for management and evaluation of her MGUS The patient's last visit with [place] was on 05/19/18. She is accompanied today by her daughter. The pt reports that she is doing well overall.   The pt reports that she has continued to have some right leg pain and is anticipating right hip surgery on 10/05/18. She denies any new bone pains and denies having any neck pains. The pt continues to enjoy stable energy levels and denies any new concerns.  Lab results (09/08/18) of CBC w/diff and CMP is as follows: all values are WNL except for Creatinine at 1.23, GFR at 48. 09/08/18 MMP revealed all values WNL except for IgG at 1604,  M Protein at 0.8g 09/18/08 SFLC revealed Kappa at 27.0, and a K:L ratio of 2.84 09/08/18 Sed Rate at 36, LDH at 159, Beta-2 microglobulin at 2.0  On review of systems, pt reports stable energy levels, moving her bowels well, and denies neck pains, new bone pains, abdominal pains, leg swelling, and any other symptoms.    MEDICAL HISTORY:  HTN HLD Anxiety Sciatica  SURGICAL HISTORY: No past surgical history on file.  SOCIAL  HISTORY: Social History   Socioeconomic History  . Marital status: Married    Spouse name: Not on file  . Number of children: Not on file  . Years of education: Not on file  . Highest education level: Not on file  Occupational History  . Not on file  Social Needs  . Financial resource strain: Not on file  . Food insecurity:    Worry: Not on file    Inability: Not on file  . Transportation needs:    Medical: Not on file    Non-medical: Not on file  Tobacco Use  . Smoking status: Former Smoker    Types: Cigarettes    Last attempt to quit: 04/19/1971    Years since quitting: 47.4  . Smokeless tobacco: Never Used  Substance and Sexual Activity  . Alcohol use: No  . Drug use: No  . Sexual activity: Not on file  Lifestyle  . Physical activity:    Days per week: Not on file    Minutes per session: Not on file  . Stress: Not on file  Relationships  . Social connections:    Talks on phone: Not on file    Gets together: Not on file    Attends religious service: Not on file    Active member of club or organization: Not on file    Attends meetings of clubs or organizations: Not on file    Relationship status: Not on file  . Intimate partner violence:    Fear of current or ex partner: Not on file    Emotionally abused: Not on file    Physically abused: Not on file    Forced sexual activity: Not on file  Other Topics Concern  . Not on file  Social History Narrative  . Not on file    FAMILY HISTORY: No family history on file.  ALLERGIES:  is allergic to atorvastatin and rosuvastatin.  MEDICATIONS:  Current Outpatient Medications  Medication Sig Dispense Refill  . albuterol (PROVENTIL HFA;VENTOLIN HFA) 108 (90 Base) MCG/ACT inhaler Inhale 2 puffs into the lungs as needed.    Marland Kitchen aspirin 81 MG chewable tablet Chew by mouth.    . CRESTOR 20 MG tablet Take 20 mg by mouth at bedtime.    . gabapentin (NEURONTIN) 100 MG capsule Take 100 mg by mouth 3 (three) times daily as  needed.  2  . hydrochlorothiazide (HYDRODIURIL) 25 MG tablet TAKE 1 TABLET(25 MG) BY MOUTH EVERY DAY IN THE MORNING    . latanoprost (XALATAN) 0.005 % ophthalmic solution Place 1 drop into both eyes at bedtime.    . metoprolol succinate (TOPROL-XL) 100 MG 24 hr tablet Take 100 mg by mouth 2 (two) times daily.     . SYMBICORT 160-4.5 MCG/ACT inhaler Inhale 2 puffs into the lungs 2 (two) times daily.    Marland Kitchen SYNTHROID 112 MCG tablet Take 112 mcg by mouth daily before breakfast.      No current facility-administered medications for this visit.     REVIEW OF SYSTEMS:  A 10+ POINT REVIEW OF SYSTEMS WAS OBTAINED including neurology, dermatology, psychiatry, cardiac, respiratory, lymph, extremities, GI, GU, Musculoskeletal, constitutional, breasts, reproductive, HEENT.  All pertinent positives are noted in the HPI.  All others are negative.    PHYSICAL EXAMINATION:  . Vitals:   09/16/18 1357 09/16/18 1403  BP: (!) 184/60 (!) 182/64  Pulse: 66   Resp: 20   Temp: 98.2 F (36.8 C)   SpO2: 100%    Filed Weights   09/16/18 1357  Weight: 215 lb 4.8 oz (97.7 kg)   .Body mass index is 35.28 kg/m.  GENERAL:alert, in no acute distress and comfortable SKIN: no acute rashes, no significant lesions EYES: conjunctiva are pink and non-injected, sclera anicteric OROPHARYNX: MMM, no exudates, no oropharyngeal erythema or ulceration NECK: supple, no JVD LYMPH:  no palpable lymphadenopathy in the cervical, axillary or inguinal regions LUNGS: clear to auscultation b/l with normal respiratory effort HEART: regular rate & rhythm ABDOMEN:  normoactive bowel sounds , non tender, not distended. No palpable hepatosplenomegaly.  Extremity: no pedal edema PSYCH: alert & oriented x 3 with fluent speech NEURO: no focal motor/sensory deficits   LABORATORY DATA:  I have reviewed the data as listed  . CBC Latest Ref Rng & Units 09/08/2018 04/11/2018  WBC 4.0 - 10.5 K/uL 5.2 11.0(H)  Hemoglobin 12.0 - 15.0  g/dL 12.1 12.8  Hematocrit 36.0 - 46.0 % 37.9 39.6  Platelets 150 - 400 K/uL 262 296    . CMP Latest Ref Rng & Units 09/08/2018 04/11/2018  Glucose 70 - 99 mg/dL 99 114(H)  BUN 8 - 23 mg/dL 15 11  Creatinine 0.44 - 1.00 mg/dL 1.23(H) 1.28(H)  Sodium 135 - 145 mmol/L 143 137  Potassium 3.5 - 5.1 mmol/L 3.8 3.8  Chloride 98 - 111 mmol/L 106 97(L)  CO2 22 - 32 mmol/L 27 27  Calcium 8.9 - 10.3 mg/dL 9.4 9.5  Total Protein 6.5 - 8.1 g/dL 7.9 -  Total Bilirubin 0.3 - 1.2 mg/dL 0.4 -  Alkaline Phos 38 - 126 U/L 69 -  AST 15 - 41 U/L 20 -  ALT 0 - 44 U/L 13 -       04/17/18 CBC:    04/21/18 CMP:    04/21/18 SPEP:   04/21/18 Immunofixation:    RADIOGRAPHIC STUDIES: I have personally reviewed the radiological images as listed and agreed with the findings in the report. No results found.  ASSESSMENT & PLAN:   83 y.o. female with  1. MGUS- Monoclonal Gammopathy or undetermined significance. 01/03/17 Bone Survey revealed No definite lytic lesions within the axial skeleton. 2. Mixed lytic and blastic lesion posteriorly at C7. CT or MRI could be used for further evaluation of the cervical spine. 3. 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 No discrete lytic lesions are present.  02/21/17 CT Cervical Spine revealed CT scan does not show a convincing abnormality within the C7 vertebral body. However, within the posterior C3 vertebral body, there is a well-circumscribed lucent area measuring 8 x 4 x 5 mm which, though not specific or conclusive, could represent a focus of myeloma.  04/24/18 MRI Lumbar Spine revealed degenerative disc disease, multilevel disc bulges and disc osteophytes and advised that the pt speak with her PCP regarding 2.9cm cystic lesion in right adnexa  Labs from initial presentation from 04/21/18: M Protein at 0.7g  PLAN -Discussed pt labwork from 09/08/18; blood counts are normal, M Protein of IgG  Kappa stable at  0.8g. Chemistries stable including Creatinine at 1.23 with known CKD. LDH and Beta-2 microglobulin normal. -Discussed the CRAB criteria again. The pt has known CKD related to her HTN and thus explained elevated renal function with Creatinine at 1.23. No anemia. Normal calcium. No neck pains whatsoever at the only location where some concern was expressed on radiographic imaging above. -The pt shows no over clinical or lab progression of her MGUS at this time. -Discussed that the patient's clinical presentation is not overtly worrisome for myeloma and presentation is consistent with MGUS. -Discussed that at this point I do not recommend completing invasive testing such as a bone marrow biopsy -Discussed that if the pt develops new neck pains, she should let myself of her PCP know as this would be indication to repeat imaging  -Recommend PCP re-image neck at interval to monitor previous C3 vertebral changes -Would repeat Bone Survey if new symptoms present -Will see the pt back in 6 months  2. HTN -chronically uncontrolled . Element of white coat hypertension. Patient notes BP much better at home. No CP/SOB or headaches. Rpt BP in clinic 170/84 PLAN -continue compliance with anti HTN -continue f/u with PCP to optimize BP control   RTC with Dr Irene Limbo with labs in 6 months. Plz schedule labs 1 week prior to clinic visit   All of the patients questions were answered with apparent satisfaction. The patient knows to call the clinic with any problems, questions or concerns.  The total time spent in the appt was 25 minutes and more than 50% was on counseling and direct patient cares.    Sullivan Lone MD MS AAHIVMS [facility] Hematology/Oncology Physician [facility]  (Office):       [phone] (Work cell):  [phone] (Fax):           [phone]  09/16/2018 2:44 PM  I, Baldwin Jamaica, am acting as a scribe for Dr. Sullivan Lone.   .I have reviewed the above  documentation for accuracy and completeness, and I agree with the above. Brunetta Genera MD

## 2018-09-16 ENCOUNTER — Telehealth: Payer: Self-pay | Admitting: Hematology

## 2018-09-16 ENCOUNTER — Inpatient Hospital Stay (HOSPITAL_BASED_OUTPATIENT_CLINIC_OR_DEPARTMENT_OTHER): Payer: Medicare Other | Admitting: Hematology

## 2018-09-16 VITALS — BP 182/64 | HR 66 | Temp 98.2°F | Resp 20 | Ht 65.5 in | Wt 215.3 lb

## 2018-09-16 DIAGNOSIS — N189 Chronic kidney disease, unspecified: Secondary | ICD-10-CM

## 2018-09-16 DIAGNOSIS — I129 Hypertensive chronic kidney disease with stage 1 through stage 4 chronic kidney disease, or unspecified chronic kidney disease: Secondary | ICD-10-CM | POA: Diagnosis not present

## 2018-09-16 DIAGNOSIS — D472 Monoclonal gammopathy: Secondary | ICD-10-CM | POA: Diagnosis not present

## 2018-09-16 DIAGNOSIS — R7989 Other specified abnormal findings of blood chemistry: Secondary | ICD-10-CM

## 2018-09-16 NOTE — Telephone Encounter (Signed)
Gave avs and calendar ° °

## 2018-09-16 NOTE — Patient Instructions (Signed)
We'll recommend to your primary care provider that an XR of the Cervical spine be repeated

## 2018-09-23 ENCOUNTER — Other Ambulatory Visit: Payer: Self-pay | Admitting: Cardiology

## 2018-09-23 DIAGNOSIS — R0989 Other specified symptoms and signs involving the circulatory and respiratory systems: Secondary | ICD-10-CM

## 2018-09-26 ENCOUNTER — Other Ambulatory Visit: Payer: Self-pay | Admitting: Cardiology

## 2018-09-28 ENCOUNTER — Other Ambulatory Visit: Payer: Self-pay | Admitting: Cardiology

## 2018-10-01 ENCOUNTER — Ambulatory Visit: Payer: Self-pay | Admitting: Cardiology

## 2018-10-02 ENCOUNTER — Other Ambulatory Visit: Payer: Self-pay

## 2018-10-02 MED ORDER — OLMESARTAN MEDOXOMIL-HCTZ 40-12.5 MG PO TABS: 1.0000 | ORAL_TABLET | Freq: Every day | ORAL | 2 refills | Status: DC

## 2018-10-05 HISTORY — PX: HIP SURGERY: SHX245

## 2018-10-30 ENCOUNTER — Ambulatory Visit: Payer: Medicare Other | Admitting: Podiatry

## 2018-11-20 ENCOUNTER — Encounter: Payer: Self-pay | Admitting: Podiatry

## 2018-11-20 ENCOUNTER — Other Ambulatory Visit: Payer: Self-pay

## 2018-11-20 ENCOUNTER — Ambulatory Visit (INDEPENDENT_AMBULATORY_CARE_PROVIDER_SITE_OTHER): Payer: Medicare Other | Admitting: Podiatry

## 2018-11-20 VITALS — HR 97

## 2018-11-20 DIAGNOSIS — M79675 Pain in left toe(s): Secondary | ICD-10-CM | POA: Diagnosis not present

## 2018-11-20 DIAGNOSIS — M79674 Pain in right toe(s): Secondary | ICD-10-CM

## 2018-11-20 DIAGNOSIS — B351 Tinea unguium: Secondary | ICD-10-CM

## 2018-11-20 NOTE — Progress Notes (Signed)
Complaint:  Visit Type: Patient returns to my office for continued preventative foot care services. Complaint: Patient states" my nails have grown long and thick and become painful to walk and wear shoes"The patient presents for preventative foot care services. No changes to ROS  Podiatric Exam: Vascular: dorsalis pedis and posterior tibial pulses are palpable bilateral. Capillary return is immediate. Temperature gradient is WNL. Skin turgor WNL  Sensorium: Normal Semmes Weinstein monofilament test. Normal tactile sensation bilaterally. Nail Exam: Pt has thick disfigured discolored nails with subungual debris noted bilateral entire nail hallux through fifth toenails Ulcer Exam: There is no evidence of ulcer or pre-ulcerative changes or infection. Orthopedic Exam: Muscle tone and strength are WNL. No limitations in general ROM. No crepitus or effusions noted. Foot type and digits show no abnormalities. HAV  B/L. Skin: No Porokeratosis. No infection or ulcers  Diagnosis:  Onychomycosis, , Pain in right toe, pain in left toes  Treatment & Plan Procedures and Treatment: Consent by patient was obtained for treatment procedures.   Debridement of mycotic and hypertrophic toenails, 1 through 5 bilateral and clearing of subungual debris. No ulceration, no infection noted.  Return Visit-Office Procedure: Patient instructed to return to the office for a follow up visit 3 months for continued evaluation and treatment.    Gardiner Barefoot DPM

## 2018-11-25 ENCOUNTER — Other Ambulatory Visit: Payer: Federal, State, Local not specified - PPO

## 2018-11-26 IMAGING — CT CT CERVICAL SPINE W/ CM
3 of 4 series · 13 of 33 positions shown, 16 images · IV contrast (iopamidol)
Comparison: Radiography 01/03/2017

CLINICAL DATA: Evaluate C7 lesion seen on previous imaging.
Monoclonal gammopathy.

EXAM:
CT CERVICAL SPINE WITH CONTRAST
TECHNIQUE: Multidetector CT imaging of the cervical spine was performed during
intravenous contrast administration. Multiplanar CT image
reconstructions were also generated.
CONTRAST:  75mL [id number] IOPAMIDOL ([id number]) INJECTION 61%

[Series 6: cor · coronal · 0.24mm/px · 3 of 53 slices shown]
[im 11/53  bone]
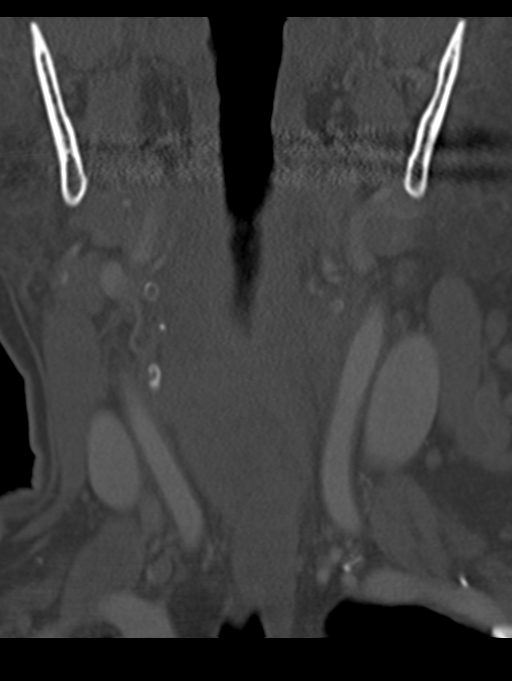
[im 21/53  bone]
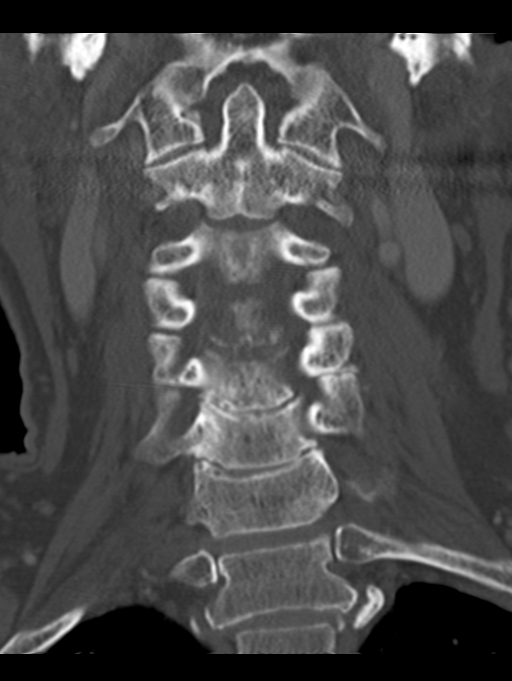
[im 32/53  bone]
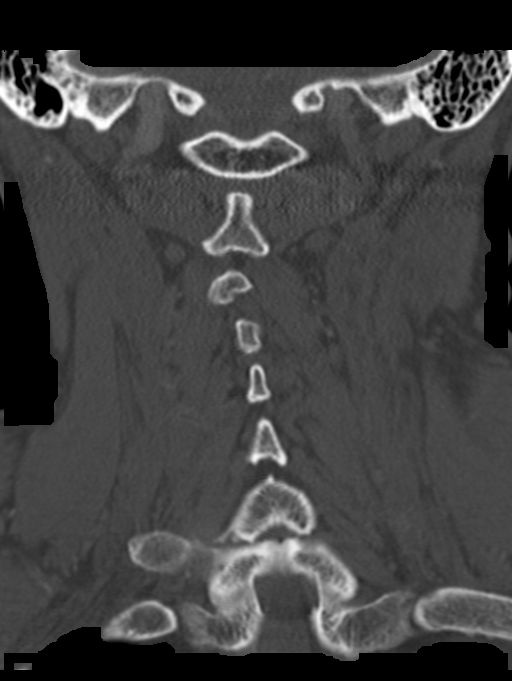

[Series 7: sag · sagittal · 0.25mm/px · 5 of 59 slices shown, 6 images]
[im 20/59  bone]
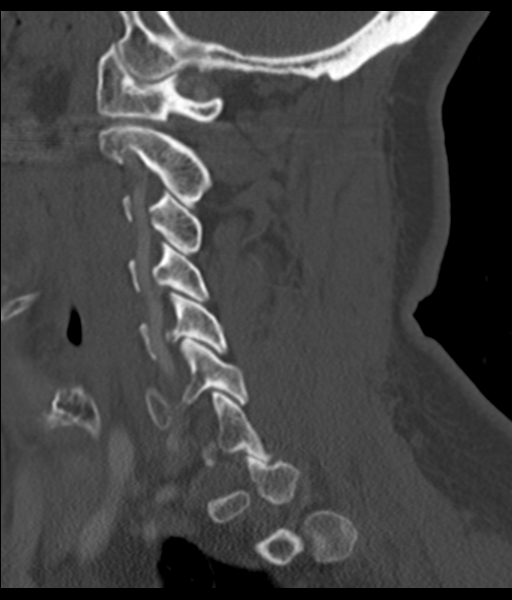
[im 25/59  bone]
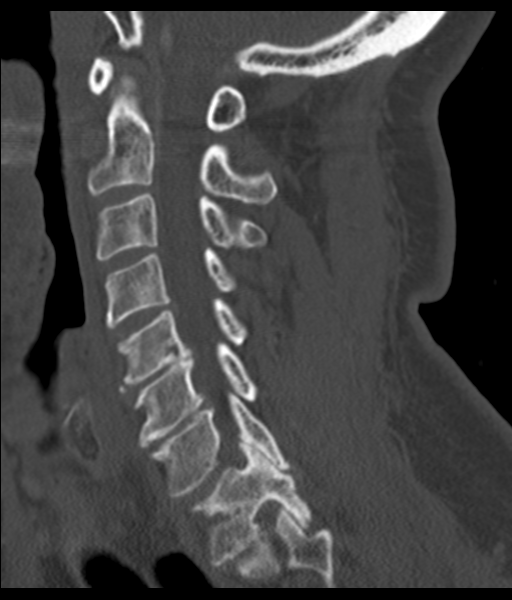
[im 30/59  soft-tissue]
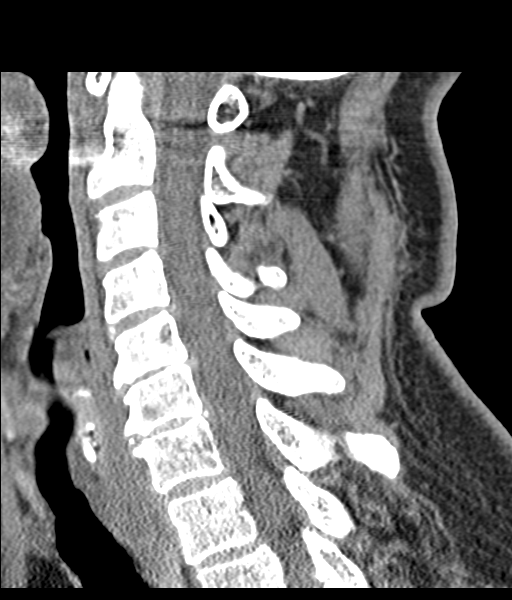
[im 30/59  bone]
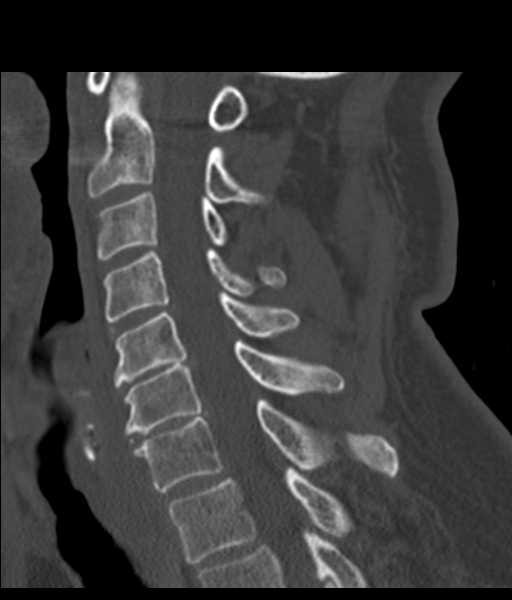
[im 34/59  bone]
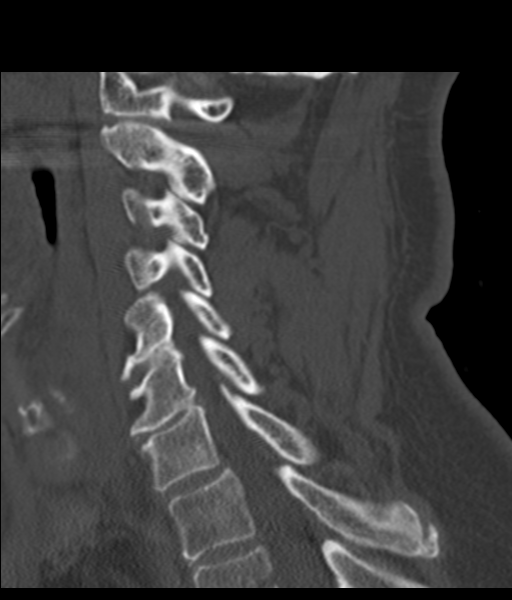
[im 39/59  bone]
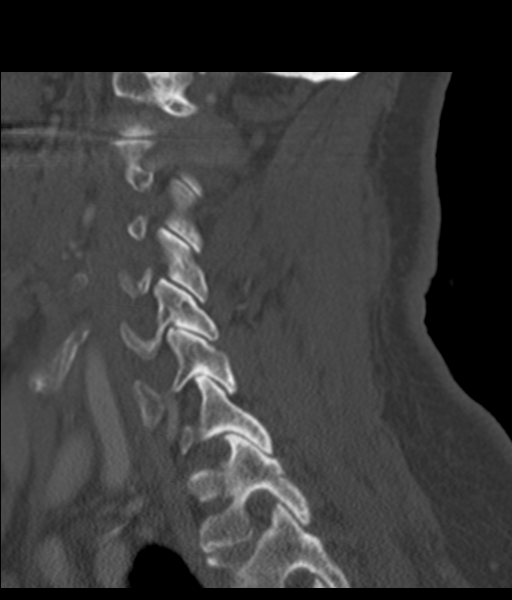

[Series 8: angled axial · axial · 0.23mm/px · z∈[-452,-347]mm · 5 of 83 slices shown, 7 images]
[im 14/83  soft-tissue]
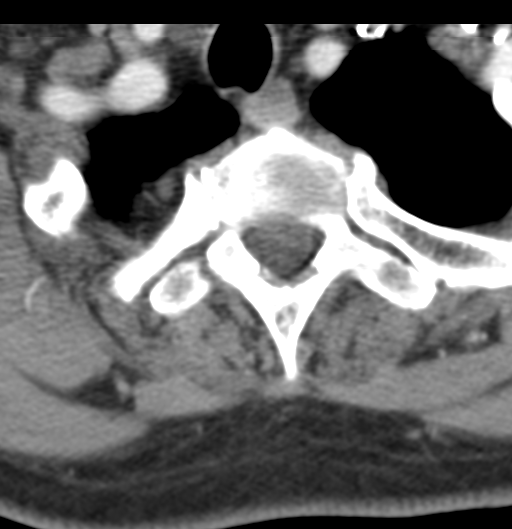
[im 14/83  bone]
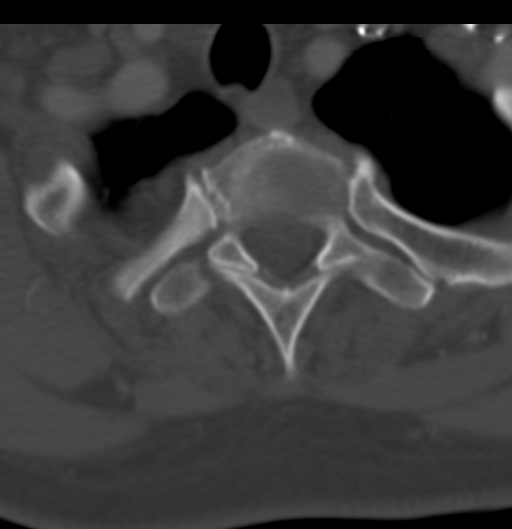
[im 28/83  bone]
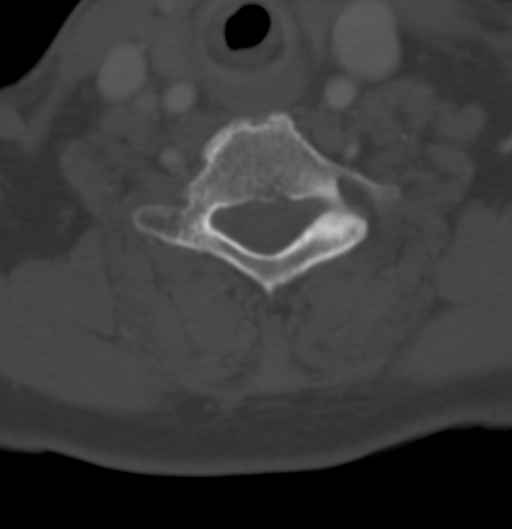
[im 42/83  bone]
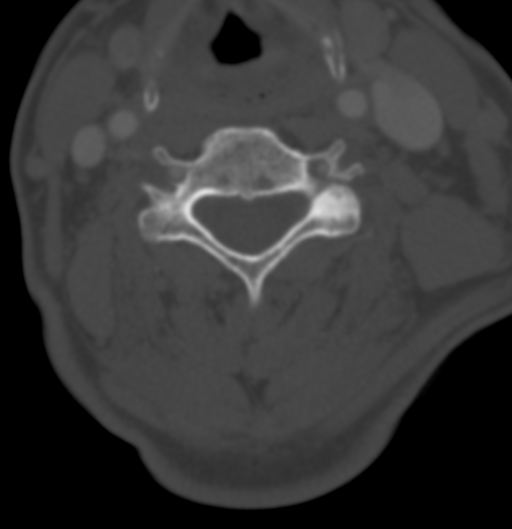
[im 55/83  bone]
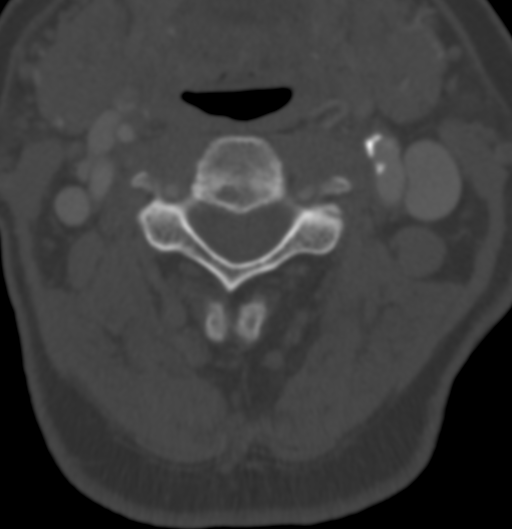
[im 69/83  soft-tissue]
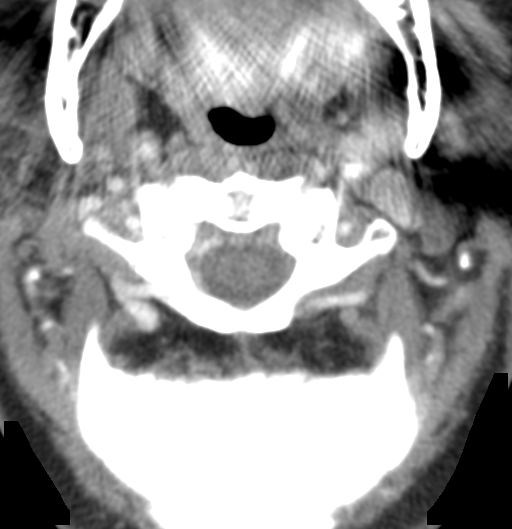
[im 69/83  bone]
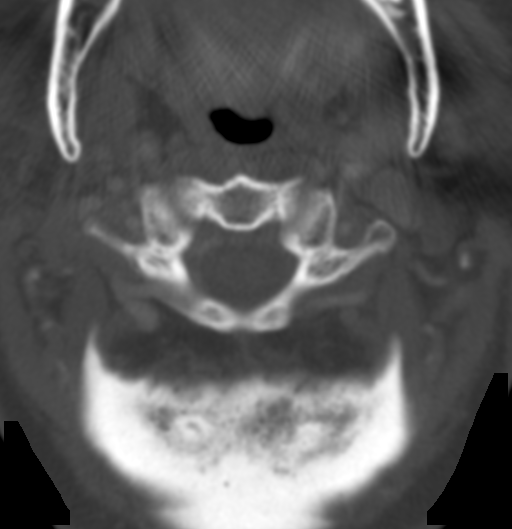

[13 of 33 positions shown; findings below may reference images not displayed]

FINDINGS: Alignment: Minimal curvature convex to the right. 2 mm
anterolisthesis at C7-T1 because of facet osteoarthritis.

Skull base and vertebrae: I do not see any bone lesion at C7. I
think there is a normal medullary space architectural pattern and do
not feel there is a lytic or sclerotic lesion. Elsewhere, the
patient does have a a well-circumscribed ovoid lucent area within
the posterior aspect of C3 just to the right of midline measuring 8
x 4 x 5 mm in size. This is nonspecific but could represent evidence
of myeloma.

Soft tissues and spinal canal: Negative except for atherosclerotic
disease at the carotid bifurcations.

Disc levels:  C1-2: Mild osteoarthritis.  No stenosis.

C2-3:  Normal interspace.

C3-4:  Normal interspace.

C4-5: Annular bulging. No compressive narrowing of the canal or
foramina.

C5-6: Spondylosis with disc space narrowing and endplate
osteophytes. Mild bony foraminal narrowing bilaterally.

C6-7: Spondylosis with disc space narrowing and endplate
osteophytes. Moderate bony foraminal narrowing bilaterally.

C7-T1: Facet arthropathy with 2 mm anterolisthesis. No canal or
foraminal stenosis.

T1-2:  Negative.

Upper chest: Negative

Other: None significant
IMPRESSION: CT scan does not show a convincing abnormality within the C7
vertebral body. However, within the posterior C3 vertebral body,
there is a well-circumscribed lucent area measuring 8 x 4 x 5 mm
which, though not specific or conclusive, could represent a focus of
myeloma.

## 2019-01-05 ENCOUNTER — Ambulatory Visit (INDEPENDENT_AMBULATORY_CARE_PROVIDER_SITE_OTHER): Payer: Medicare Other

## 2019-01-05 ENCOUNTER — Other Ambulatory Visit: Payer: Self-pay

## 2019-01-05 DIAGNOSIS — R0989 Other specified symptoms and signs involving the circulatory and respiratory systems: Secondary | ICD-10-CM | POA: Diagnosis not present

## 2019-01-05 DIAGNOSIS — I6523 Occlusion and stenosis of bilateral carotid arteries: Secondary | ICD-10-CM

## 2019-02-12 ENCOUNTER — Encounter: Payer: Self-pay | Admitting: Cardiology

## 2019-02-12 ENCOUNTER — Other Ambulatory Visit: Payer: Self-pay

## 2019-02-12 ENCOUNTER — Ambulatory Visit (INDEPENDENT_AMBULATORY_CARE_PROVIDER_SITE_OTHER): Payer: Medicare Other | Admitting: Cardiology

## 2019-02-12 VITALS — BP 190/74 | HR 59 | Ht 65.0 in | Wt 215.6 lb

## 2019-02-12 DIAGNOSIS — I739 Peripheral vascular disease, unspecified: Secondary | ICD-10-CM | POA: Diagnosis not present

## 2019-02-12 DIAGNOSIS — R0989 Other specified symptoms and signs involving the circulatory and respiratory systems: Secondary | ICD-10-CM | POA: Diagnosis not present

## 2019-02-12 DIAGNOSIS — I10 Essential (primary) hypertension: Secondary | ICD-10-CM

## 2019-02-12 DIAGNOSIS — E7800 Pure hypercholesterolemia, unspecified: Secondary | ICD-10-CM

## 2019-02-12 DIAGNOSIS — I6523 Occlusion and stenosis of bilateral carotid arteries: Secondary | ICD-10-CM

## 2019-02-12 DIAGNOSIS — I2510 Atherosclerotic heart disease of native coronary artery without angina pectoris: Secondary | ICD-10-CM | POA: Diagnosis not present

## 2019-02-12 HISTORY — DX: Other specified symptoms and signs involving the circulatory and respiratory systems: R09.89

## 2019-02-12 HISTORY — DX: Peripheral vascular disease, unspecified: I73.9

## 2019-02-12 NOTE — Progress Notes (Signed)
Primary Physician/Referring:  Chesley Noon, MD  Patient ID: Brenda Barry, female    DOB: [birth year], 83 y.o.   MRN: [id number]  No chief complaint on file.  HPI:    HPI: Brenda Barry  is a 83 y.o.  Afro-American female with known coronary artery disease with remote coronary stenting 2001 and history of brachytherapy due to restenosis, difficult to control hypertension, asymptomatic bilateral carotid artery disease, PAD with both neurogenic claudication and vascular claudication, stage III chronic kidney disease due to hypertension, hyperlipidemia, presents here for 6 month OV.  She has had difficulty in BP control, but states recently it has been well controlled. No angina, claudication is stable and states she has been active and doing well.   Past Medical History:  Diagnosis Date   Bilateral carotid bruits 02/12/2019   Peripheral artery disease ([place]) 02/12/2019   Past Surgical History:  Procedure Laterality Date   HIP SURGERY Right 10/05/2018   Social History   Socioeconomic History   Marital status: Widowed    Spouse name: Not on file   Number of children: 3   Years of education: Not on file   Highest education level: Not on file  Occupational History   Not on file  Social Needs   Financial resource strain: Not on file   Food insecurity    Worry: Not on file    Inability: Not on file   Transportation needs    Medical: Not on file    Non-medical: Not on file  Tobacco Use   Smoking status: Former Smoker    Packs/day: 0.25    Years: 10.00    Pack years: 2.50    Types: Cigarettes    Quit date: 04/19/1971    Years since quitting: 47.8   Smokeless tobacco: Never Used  Substance and Sexual Activity   Alcohol use: No   Drug use: No   Sexual activity: Not on file  Lifestyle   Physical activity    Days per week: Not on file    Minutes per session: Not on file   Stress: Not on file  Relationships   Social connections    Talks on phone:  Not on file    Gets together: Not on file    Attends religious service: Not on file    Active member of club or organization: Not on file    Attends meetings of clubs or organizations: Not on file    Relationship status: Not on file   Intimate partner violence    Fear of current or ex partner: Not on file    Emotionally abused: Not on file    Physically abused: Not on file    Forced sexual activity: Not on file  Other Topics Concern   Not on file  Social History Narrative   Not on file   ROS  Review of Systems  Constitution: Negative for chills, decreased appetite, malaise/fatigue and weight gain.  Cardiovascular: Positive for claudication. Negative for dyspnea on exertion, leg swelling and syncope.  Endocrine: Negative for cold intolerance.  Hematologic/Lymphatic: Does not bruise/bleed easily.  Musculoskeletal: Positive for arthritis and back pain. Negative for joint swelling.  Gastrointestinal: Negative for abdominal pain, anorexia, change in bowel habit, hematochezia and melena.  Neurological: Negative for headaches and light-headedness.  Psychiatric/Behavioral: Negative for depression and substance abuse. The patient is nervous/anxious.   All other systems reviewed and are negative.  Objective  Blood pressure (!) 190/74, pulse (!) 59, height _0  (1.651  m), weight 215 lb 9.6 oz (97.8 kg), SpO2 100 %. Body mass index is 35.88 kg/m.   Physical Exam  Constitutional: She appears well-developed and well-nourished. No distress.  HENT:  Head: Atraumatic.  Eyes: Conjunctivae are normal.  Neck: Neck supple. No JVD present. No thyromegaly present.  Cardiovascular: Normal rate, regular rhythm, S1 normal, S2 normal and intact distal pulses. Exam reveals no gallop.  Murmur heard.  Harsh early systolic murmur is present with a grade of 2/6 at the upper right sternal border radiating to the neck. Pulses:      Carotid pulses are on the right side with bruit and on the left side with  bruit.      Femoral pulses are 2+ on the right side and 2+ on the left side.      Popliteal pulses are 1+ on the right side and 2+ on the left side.       Dorsalis pedis pulses are 1+ on the right side and 2+ on the left side.       Posterior tibial pulses are 0 on the right side and 0 on the left side.  Pulmonary/Chest: Effort normal and breath sounds normal.  Abdominal: Soft. Bowel sounds are normal.  Musculoskeletal: Normal range of motion.        General: No edema.  Neurological: She is alert.  Skin: Skin is warm and dry.  Psychiatric: She has a normal mood and affect.   Radiology: No results found.  Laboratory examination:   LP+Non-HDL CholesterolResulted: 02/01/2019 10:33 AM [facility] Component Name Value Ref Range  Cholesterol, Total 189 100 - 199 mg/dL  Triglycerides 80 0 - 149 mg/dL  HDL 73 >39 mg/dL  VLDL Cholesterol Cal 16 5 - 40 mg/dL  LDL 100 (H) 0 - 99 mg/dL   Comprehensive Metabolic PanelResulted: 8/85/0277 10:33 AM [facility] Component Name Value Ref Range  Glucose 85 65 - 99 mg/dL  BUN 16 8 - 27 mg/dL  Creatinine, Serum 1.03 (H) 0.57 - 1 mg/dL  eGFR If NonAfrican American 51 (L) >59 mL/min/1.73  eGFR If African American 59 (L) >59 mL/min/1.73  BUN/Creatinine Ratio 16 12 - 28   Sodium 143 134 - 144 mmol/L  Potassium 4.9 3.5 - 5.2 mmol/L  Chloride 107 (H) 96 - 106 mmol/L  CO2 25 20 - 29 mmol/L  CALCIUM 9.6 8.7 - 10.3 mg/dL  Total Protein 7.0 6 - 8.5 g/dL  Albumin, Serum 4.0 3.7 - 4.7 g/dL  Globulin, Total 3.0 1.5 - 4.5 g/dL  Albumin/Globulin Ratio 1.3 1.2 - 2.2   Total Bilirubin 0.2 0 - 1.2 mg/dL  Alkaline Phosphatase 71 39 - 117 IU/L  AST 21 0 - 40 IU/L  ALT (SGPT) 14 0 - 32 IU/L  TSHResulted: 02/01/2019 10:33 AM [facility]       CMP Latest Ref Rng & Units 09/08/2018 04/11/2018  Glucose 70 - 99 mg/dL 99 114(H)  BUN 8 - 23 mg/dL 15 11  Creatinine 0.44 - 1.00 mg/dL 1.23(H) 1.28(H)  Sodium 135 - 145 mmol/L 143 137  Potassium 3.5 - 5.1  mmol/L 3.8 3.8  Chloride 98 - 111 mmol/L 106 97(L)  CO2 22 - 32 mmol/L 27 27  Calcium 8.9 - 10.3 mg/dL 9.4 9.5  Total Protein 6.5 - 8.1 g/dL 7.9 -  Total Bilirubin 0.3 - 1.2 mg/dL 0.4 -  Alkaline Phos 38 - 126 U/L 69 -  AST 15 - 41 U/L 20 -  ALT 0 - 44 U/L 13 -  CBC Latest Ref Rng & Units 09/08/2018 04/11/2018  WBC 4.0 - 10.5 K/uL 5.2 11.0(H)  Hemoglobin 12.0 - 15.0 g/dL 12.1 12.8  Hematocrit 36.0 - 46.0 % 37.9 39.6  Platelets 150 - 400 K/uL 262 296   Lipid Panel  No results found for: CHOL, TRIG, HDL, CHOLHDL, VLDL, LDLCALC, LDLDIRECT HEMOGLOBIN A1C No results found for: HGBA1C, MPG TSH No results for input(s): TSH in the last 8760 hours. Medications   Current Outpatient Medications  Medication Instructions   albuterol (PROVENTIL HFA;VENTOLIN HFA) 108 (90 Base) MCG/ACT inhaler 2 puffs, Inhalation, As needed   aspirin 81 MG chewable tablet Oral   budesonide (PULMICORT) 0.25 mg, Nebulization, As needed   Crestor 20 mg, Oral, Daily at bedtime   hydrALAZINE (APRESOLINE) 25 mg, Oral, 3 times daily   latanoprost (XALATAN) 0.005 % ophthalmic solution 1 drop, Both Eyes, Daily at bedtime   metoprolol succinate (TOPROL-XL) 100 mg, Oral, 2 times daily   olmesartan-hydrochlorothiazide (BENICAR HCT) 40-12.5 MG tablet 1 tablet, Oral, Daily   Synthroid 112 mcg, Oral, Daily before breakfast    Cardiac Studies:   Coronary stent in ? 2001 and restenosis and brachytherapy following year, no details available in EPIC.  Lower extremity arterial duplex 05/15/2018: Right mid SFA is occluded with distal reconstitution. No hemodynamically significant stenoses are identified in the left lower extremity arterial system. This exam reveals normal perfusion of the lower extremity (ABI 1.00 bilaterally).  Carotid artery duplex  01/05/2019: Stenosis in the right internal carotid artery (16-49%). Stenosis in the left internal carotid artery (50-69%). Antegrade right vertebral artery flow.  Antegrade left vertebral artery flow. Follow up in six months is appropriate if clinically indicated.  Assessment     ICD-10-CM   1. Coronary artery disease involving native coronary artery of native heart without angina pectoris  I25.10    LAD stent in 2001 S/P brachytherapy, No details.  2. Essential hypertension  I10   3. Bilateral carotid bruits  R09.89 EKG 12-Lead  4. Peripheral artery disease (HCC)  I73.9 EKG 12-Lead  5. Hypercholesteremia  E78.00     EKG 02/12/2019 Sinus  Bradycardia @ 59/min. PRWP, CRO -Anteroseptal infarct -age undetermined. No ischemia.  EKG 04/27/2018: Normal sinus rhythm at rate of 60 bpm, normal axis.  Poor R-wave progression, probably normal variant.  No evidence of ischemia, normal EKG.  Recommendations:   She is here on a six-month office visit and follow-up of carotid stenosis, carotid stenosis is remained stable.  I reviewed her labs, lipids are still not well controlled with LDL of 100.  In view of carotid artery disease, CAD and PAD I would prefer her to be on Zetia 10 mg in the evening after dinner, she is reluctant and states she has multiple intolerances to the medications, advised her to use Metamucil to improve her lipids.  Blood pressure is elevated today, I reviewed her blood pressure from PCP, blood pressure is well controlled there.  Advised her to monitor this and if blood pressure consistently remains greater than 140 mmHg the could certainly increase hydralazine to 50 mg p.o. t.i.d. Again she is hesitant to making change.   CAD and PAD stable with no change in physical exam from previous.   Otherwise I did not make any changes today, she's been scheduled for repeat carotid duplex in 6 months and see her then.  Weight loss of 10 pounds discussed.  Brenda Prows, MD, [facility], [place] 02/16/2019, [facility]. [place] Pager: [phone] Office: [phone] If no answer Cell  [phone]

## 2019-02-24 ENCOUNTER — Encounter: Payer: Self-pay | Admitting: Podiatry

## 2019-02-24 ENCOUNTER — Ambulatory Visit (INDEPENDENT_AMBULATORY_CARE_PROVIDER_SITE_OTHER): Payer: Medicare Other | Admitting: Podiatry

## 2019-02-24 ENCOUNTER — Other Ambulatory Visit: Payer: Self-pay

## 2019-02-24 VITALS — Temp 98.1°F

## 2019-02-24 DIAGNOSIS — M79675 Pain in left toe(s): Secondary | ICD-10-CM | POA: Diagnosis not present

## 2019-02-24 DIAGNOSIS — M79674 Pain in right toe(s): Secondary | ICD-10-CM | POA: Diagnosis not present

## 2019-02-24 DIAGNOSIS — B351 Tinea unguium: Secondary | ICD-10-CM | POA: Diagnosis not present

## 2019-02-24 NOTE — Progress Notes (Signed)
Complaint:  Visit Type: Patient returns to my office for continued preventative foot care services. Complaint: Patient states" my nails have grown long and thick and become painful to walk and wear shoes"The patient presents for preventative foot care services. No changes to ROS  Podiatric Exam: Vascular: dorsalis pedis and posterior tibial pulses are palpable bilateral. Capillary return is immediate. Temperature gradient is WNL. Skin turgor WNL  Sensorium: Normal Semmes Weinstein monofilament test. Normal tactile sensation bilaterally. Nail Exam: Pt has thick disfigured discolored nails with subungual debris noted bilateral entire nail hallux through fifth toenails Ulcer Exam: There is no evidence of ulcer or pre-ulcerative changes or infection. Orthopedic Exam: Muscle tone and strength are WNL. No limitations in general ROM. No crepitus or effusions noted. Foot type and digits show no abnormalities. HAV  B/L. Skin: No Porokeratosis. No infection or ulcers  Diagnosis:  Onychomycosis, , Pain in right toe, pain in left toes  Treatment & Plan Procedures and Treatment: Consent by patient was obtained for treatment procedures.   Debridement of mycotic and hypertrophic toenails, 1 through 5 bilateral and clearing of subungual debris. No ulceration, no infection noted.  Return Visit-Office Procedure: Patient instructed to return to the office for a follow up visit 3 months for continued evaluation and treatment.    Gardiner Barefoot DPM

## 2019-03-09 ENCOUNTER — Inpatient Hospital Stay: Payer: Medicare Other | Attending: Hematology

## 2019-03-09 ENCOUNTER — Other Ambulatory Visit: Payer: Self-pay

## 2019-03-09 DIAGNOSIS — D472 Monoclonal gammopathy: Secondary | ICD-10-CM | POA: Diagnosis not present

## 2019-03-09 LAB — CBC WITH DIFFERENTIAL/PLATELET
Abs Immature Granulocytes: 0.01 10*3/uL (ref 0.00–0.07)
Basophils Absolute: 0 10*3/uL (ref 0.0–0.1)
Basophils Relative: 1 %
Eosinophils Absolute: 0.3 10*3/uL (ref 0.0–0.5)
Eosinophils Relative: 5 %
HCT: 35 % — ABNORMAL LOW (ref 36.0–46.0)
Hemoglobin: 11.1 g/dL — ABNORMAL LOW (ref 12.0–15.0)
Immature Granulocytes: 0 %
Lymphocytes Relative: 40 %
Lymphs Abs: 2.4 10*3/uL (ref 0.7–4.0)
MCH: 30.5 pg (ref 26.0–34.0)
MCHC: 31.7 g/dL (ref 30.0–36.0)
MCV: 96.2 fL (ref 80.0–100.0)
Monocytes Absolute: 0.4 10*3/uL (ref 0.1–1.0)
Monocytes Relative: 7 %
Neutro Abs: 2.8 10*3/uL (ref 1.7–7.7)
Neutrophils Relative %: 47 %
Platelets: 261 10*3/uL (ref 150–400)
RBC: 3.64 MIL/uL — ABNORMAL LOW (ref 3.87–5.11)
RDW: 15.4 % (ref 11.5–15.5)
WBC: 5.9 10*3/uL (ref 4.0–10.5)
nRBC: 0 % (ref 0.0–0.2)

## 2019-03-09 LAB — CMP (CANCER CENTER ONLY)
ALT: 18 U/L (ref 0–44)
AST: 22 U/L (ref 15–41)
Albumin: 3.9 g/dL (ref 3.5–5.0)
Alkaline Phosphatase: 75 U/L (ref 38–126)
Anion gap: 10 (ref 5–15)
BUN: 17 mg/dL (ref 8–23)
CO2: 26 mmol/L (ref 22–32)
Calcium: 9.4 mg/dL (ref 8.9–10.3)
Chloride: 106 mmol/L (ref 98–111)
Creatinine: 1.3 mg/dL — ABNORMAL HIGH (ref 0.44–1.00)
GFR, Est AFR Am: 45 mL/min — ABNORMAL LOW (ref >60)
GFR, Estimated: 38 mL/min — ABNORMAL LOW (ref >60)
Glucose, Bld: 91 mg/dL (ref 70–99)
Potassium: 4.1 mmol/L (ref 3.5–5.1)
Sodium: 142 mmol/L (ref 135–145)
Total Bilirubin: 0.4 mg/dL (ref 0.3–1.2)
Total Protein: 7.7 g/dL (ref 6.5–8.1)

## 2019-03-11 LAB — MULTIPLE MYELOMA PANEL, SERUM
Albumin SerPl Elph-Mcnc: 3.9 g/dL (ref 2.9–4.4)
Albumin/Glob SerPl: 1.3 (ref 0.7–1.7)
Alpha 1: 0.2 g/dL (ref 0.0–0.4)
Alpha2 Glob SerPl Elph-Mcnc: 0.7 g/dL (ref 0.4–1.0)
B-Globulin SerPl Elph-Mcnc: 0.9 g/dL (ref 0.7–1.3)
Gamma Glob SerPl Elph-Mcnc: 1.3 g/dL (ref 0.4–1.8)
Globulin, Total: 3.2 g/dL (ref 2.2–3.9)
IgA: 173 mg/dL (ref 64–422)
IgG (Immunoglobin G), Serum: 1502 mg/dL (ref 586–1602)
IgM (Immunoglobulin M), Srm: 58 mg/dL (ref 26–217)
M Protein SerPl Elph-Mcnc: 0.9 g/dL — ABNORMAL HIGH
Total Protein ELP: 7.1 g/dL (ref 6.0–8.5)

## 2019-03-12 ENCOUNTER — Encounter: Payer: Self-pay | Admitting: Hematology

## 2019-03-15 NOTE — Progress Notes (Signed)
HEMATOLOGY/ONCOLOGY CONSULTATION NOTE  Date of Service: 03/16/2019  Patient Care Team: Chesley Noon, MD as PCP - General (Family Medicine)  CHIEF COMPLAINTS/PURPOSE OF CONSULTATION:  Monoclonal Paraproteinemia   HISTORY OF PRESENTING ILLNESS:  Brenda Barry is a wonderful 83 y.o. female who has been referred to [place] by Dr. Anastasia Pall for evaluation and management of Monoclonal Paraproteinemia. The pt reports that she is doing well overall.   The pt reports that she has had sciatica in the last year with referred pain down her leg, caused by a pinched nerve at L4/5.  The pt is intending to see Dr. Leonel Ramsay in neurosurgery soon. The pt's lower back pain has been evaluated with an MRI most recently on 04/24/18. The pt is also being evaluated by cardiology.  The pt denies any neck pain whatsoever and denies any new bone pains.   The pt notes that her kidney functions have been stable recently and has been pursuing care with Dr. Erling [facility] at [facility]. The pt notes that her blood pressure has been high, and is elevated more when she sees physicians. She has taken HCTZ, Toprol-XL, and Hydralazine. The pt notes that her thyroid medication has recently been adjusted.   Of note prior to the patient's presentation, the pt had a Bone Survey on 01/03/17 which revealed No definite lytic lesions within the axial skeleton. 2. Mixed lytic and blastic lesion posteriorly at C7. CT or MRI could be used for further evaluation of the cervical spine. 3. 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 No discrete lytic lesions are present.   Subsequently the pt then had a CT Cervical Spine on 02/21/17 which revealed CT scan does not show a convincing abnormality within the C7 vertebral body. However, within the posterior C3 vertebral body, there is a well-circumscribed lucent area measuring 8 x 4 x 5 mm which, though not specific or  conclusive, could represent a focus of myeloma.  Most recent lab results (04/21/18) of CMP is as follows: all values are WNL except for Glucose at 104, Creatinine at 1.07, GFR at 57, BUN at 10, Sodium at 145, Phosphorous at 2.2. 04/21/18 SPEP revealed all values WNL except for M-spike at 0.7, with IgG monoclonal protein with kappa light chain specificity.   On review of systems, pt reports sciatica, stable energy levels, stable lower back pain, and denies any neck pain, new bone pains, abdominal pains, chest wall pain, and any other symptoms.   On PMHx the pt reports Osteoporosis, Hypothyroidism, Hyperlipidemia, CAD with 5p stent place in late 90s, benign lumpectomy of right breast, HTN, IgG monoclonal gammopathy, CKD stage III. On Social Hx the pt reports that she quit smoking cigarettes about 50 years ago.  Interval History:   Brenda Barry returns today for management and evaluation of her MGUS. The patient's last visit with [place] was on 09/16/2018.The pt reports that she is doing well overall.  The pt reports that she had right hip surgery on 03/16 which has helped with her walking and her pain. Pt states that she never had neck pain but she did have back pain, which is why she had her 02/21/2017 CT Cervical Spine scan. Pt is not interested in having a repeat CT scan at this time.  Lab results today (03/09/19) of CBC w/diff and CMP is as follows: all values are WNL except for RBC at 3.64, Hgb at 11.1, HCT at 35.0, Creatinine at 1.30, GFR  Est AFR Am at 45. 03/09/2019 MMP all values are WNL except for M Protein SerPl Elph-Mcnc at 0.9.   On review of systems, pt denies new lumps/bumps, neck pain, back pain, abdominal pain and any other symptoms.   MEDICAL HISTORY:  HTN HLD Anxiety Sciatica  SURGICAL HISTORY: Past Surgical History:  Procedure Laterality Date   HIP SURGERY Right 10/05/2018    SOCIAL HISTORY: Social History   Socioeconomic History   Marital status: Widowed     Spouse name: Not on file   Number of children: 3   Years of education: Not on file   Highest education level: Not on file  Occupational History   Not on file  Social Needs   Financial resource strain: Not on file   Food insecurity    Worry: Not on file    Inability: Not on file   Transportation needs    Medical: Not on file    Non-medical: Not on file  Tobacco Use   Smoking status: Former Smoker    Packs/day: 0.25    Years: 10.00    Pack years: 2.50    Types: Cigarettes    Quit date: 04/19/1971    Years since quitting: 47.9   Smokeless tobacco: Never Used  Substance and Sexual Activity   Alcohol use: No   Drug use: No   Sexual activity: Not on file  Lifestyle   Physical activity    Days per week: Not on file    Minutes per session: Not on file   Stress: Not on file  Relationships   Social connections    Talks on phone: Not on file    Gets together: Not on file    Attends religious service: Not on file    Active member of club or organization: Not on file    Attends meetings of clubs or organizations: Not on file    Relationship status: Not on file   Intimate partner violence    Fear of current or ex partner: Not on file    Emotionally abused: Not on file    Physically abused: Not on file    Forced sexual activity: Not on file  Other Topics Concern   Not on file  Social History Narrative   Not on file    FAMILY HISTORY: No family history on file.  ALLERGIES:  is allergic to atorvastatin and rosuvastatin.  MEDICATIONS:  Current Outpatient Medications  Medication Sig Dispense Refill   albuterol (PROVENTIL HFA;VENTOLIN HFA) 108 (90 Base) MCG/ACT inhaler Inhale 2 puffs into the lungs as needed.     aspirin 81 MG chewable tablet Chew by mouth.     budesonide (PULMICORT) 0.25 MG/2ML nebulizer solution Take 0.25 mg by nebulization as needed.     CRESTOR 20 MG tablet Take 20 mg by mouth at bedtime.     hydrALAZINE (APRESOLINE) 25 MG tablet  Take 25 mg by mouth 3 (three) times daily.     latanoprost (XALATAN) 0.005 % ophthalmic solution Place 1 drop into both eyes at bedtime.     metoprolol succinate (TOPROL-XL) 100 MG 24 hr tablet Take 100 mg by mouth 2 (two) times daily.      olmesartan-hydrochlorothiazide (BENICAR HCT) 40-12.5 MG tablet Take 1 tablet by mouth daily. 90 tablet 2   SYMBICORT 160-4.5 MCG/ACT inhaler      SYNTHROID 100 MCG tablet Take 112 mcg by mouth daily before breakfast.      No current facility-administered medications for this visit.  REVIEW OF SYSTEMS:    A 10+ POINT REVIEW OF SYSTEMS WAS OBTAINED including neurology, dermatology, psychiatry, cardiac, respiratory, lymph, extremities, GI, GU, Musculoskeletal, constitutional, breasts, reproductive, HEENT.  All pertinent positives are noted in the HPI.  All others are negative.   PHYSICAL EXAMINATION:  . Vitals:   03/16/19 0938  BP: (!) 207/79  Pulse: 60  Resp: 18  Temp: 97.8 F (36.6 C)  SpO2: 97%   Filed Weights   03/16/19 0938  Weight: 222 lb 6.4 oz (100.9 kg)   .Body mass index is 37.01 kg/m.  Exam was given in a chair  GENERAL:alert, in no acute distress and comfortable SKIN: no acute rashes, no significant lesions EYES: conjunctiva are pink and non-injected, sclera anicteric OROPHARYNX: MMM, no exudates, no oropharyngeal erythema or ulceration NECK: supple, no JVD LYMPH:  no palpable lymphadenopathy in the cervical, axillary or inguinal regions LUNGS: clear to auscultation b/l with normal respiratory effort HEART: regular rate & rhythm ABDOMEN:  normoactive bowel sounds , non tender, not distended. No palpable hepatosplenomegaly.  Extremity: no pedal edema PSYCH: alert & oriented x 3 with fluent speech NEURO: no focal motor/sensory deficits  LABORATORY DATA:  I have reviewed the data as listed  . CBC Latest Ref Rng & Units 03/09/2019 09/08/2018 04/11/2018  WBC 4.0 - 10.5 K/uL 5.9 5.2 11.0(H)  Hemoglobin 12.0 - 15.0 g/dL  11.1(L) 12.1 12.8  Hematocrit 36.0 - 46.0 % 35.0(L) 37.9 39.6  Platelets 150 - 400 K/uL 261 262 296    . CMP Latest Ref Rng & Units 03/09/2019 09/08/2018 04/11/2018  Glucose 70 - 99 mg/dL 91 99 114(H)  BUN 8 - 23 mg/dL '17 15 11  '$ Creatinine 0.44 - 1.00 mg/dL 1.30(H) 1.23(H) 1.28(H)  Sodium 135 - 145 mmol/L 142 143 137  Potassium 3.5 - 5.1 mmol/L 4.1 3.8 3.8  Chloride 98 - 111 mmol/L 106 106 97(L)  CO2 22 - 32 mmol/L '26 27 27  '$ Calcium 8.9 - 10.3 mg/dL 9.4 9.4 9.5  Total Protein 6.5 - 8.1 g/dL 7.7 7.9 -  Total Bilirubin 0.3 - 1.2 mg/dL 0.4 0.4 -  Alkaline Phos 38 - 126 U/L 75 69 -  AST 15 - 41 U/L 22 20 -  ALT 0 - 44 U/L 18 13 -    03/16/2019 MMP       04/17/18 CBC:    04/21/18 CMP:    04/21/18 SPEP:   04/21/18 Immunofixation:    RADIOGRAPHIC STUDIES: I have personally reviewed the radiological images as listed and agreed with the findings in the report. No results found.  ASSESSMENT & PLAN:   83 y.o. female with  1. MGUS- Monoclonal Gammopathy or undetermined significance. 01/03/17 Bone Survey revealed No definite lytic lesions within the axial skeleton. 2. Mixed lytic and blastic lesion posteriorly at C7. CT or MRI could be used for further evaluation of the cervical spine. 3. 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 No discrete lytic lesions are present.  02/21/17 CT Cervical Spine revealed CT scan does not show a convincing abnormality within the C7 vertebral body. However, within the posterior C3 vertebral body, there is a well-circumscribed lucent area measuring 8 x 4 x 5 mm which, though not specific or conclusive, could represent a focus of myeloma.  04/24/18 MRI Lumbar Spine revealed degenerative disc disease, multilevel disc bulges and disc osteophytes and advised that the pt speak with her PCP regarding 2.9cm cystic lesion in right adnexa  Labs from  initial presentation from 04/21/18: M Protein at  0.7g  PLAN:  -Discussed pt labwork today, 03/09/19; all values are WNL except for RBC at 3.64, Hgb at 11.1, HCT at 35.0, Creatinine at 1.30, GFR Est AFR Am at 45. -Discussed 03/09/2019 MMP all values are WNL except for M Protein SerPl Elph-Mcnc at 0.9.  -Discussed getting PET Scan and bone marrow biopsy vs monitoring. Pt would prefer to monitor at this time. -Will continue to monitor pt with labs and clinical every 6 months for progression of Multiple Myeloma -The pt shows no over clinical or lab progression of her MGUS at this time. -Discussed that if the pt develops new neck pains, she should let myself of her PCP know as this would be indication to repeat imaging  -Recommend PCP re-image neck at interval to monitor previous C3 vertebral changes -Will see the pt back in 6  months  2. HTN -chronically uncontrolled . Element of white coat hypertension. Patient notes BP much better at home. No CP/SOB or headaches. Rpt BP in clinic 170/84 PLAN -continue compliance with anti HTN -continue f/u with PCP to optimize BP control   FOLLOW UP:  RTC with Dr Irene Limbo with labs in 6 months. Plz schedule labs 1 week prior to clinic visit   The total time spent in the appt was 15 minutes and more than 50% was on counseling and direct patient cares.  All of the patient's questions were answered with apparent satisfaction. The patient knows to call the clinic with any problems, questions or concerns.    Sullivan Lone MD [place] AAHIVMS [facility] Hematology/Oncology Physician [facility]  (Office):       [phone] (Work cell):  [phone] (Fax):           [phone]  03/16/2019 11:57 AM  I, Yevette Edwards, am acting as a scribe for Dr. Sullivan Lone.   .I have reviewed the above documentation for accuracy and completeness, and I agree with the above. Brunetta Genera MD

## 2019-03-16 ENCOUNTER — Inpatient Hospital Stay (HOSPITAL_BASED_OUTPATIENT_CLINIC_OR_DEPARTMENT_OTHER): Payer: Medicare Other | Admitting: Hematology

## 2019-03-16 ENCOUNTER — Other Ambulatory Visit: Payer: Self-pay

## 2019-03-16 ENCOUNTER — Telehealth: Payer: Self-pay | Admitting: Hematology

## 2019-03-16 VITALS — BP 207/79 | HR 60 | Temp 97.8°F | Resp 18 | Ht 65.0 in | Wt 222.4 lb

## 2019-03-16 DIAGNOSIS — I6523 Occlusion and stenosis of bilateral carotid arteries: Secondary | ICD-10-CM | POA: Diagnosis not present

## 2019-03-16 DIAGNOSIS — D472 Monoclonal gammopathy: Secondary | ICD-10-CM

## 2019-03-16 NOTE — Telephone Encounter (Deleted)
Scheduled appt per 8/24 los. ° °Printed calendar and avs. °

## 2019-03-16 NOTE — Telephone Encounter (Addendum)
Scheduled appt per 8/25 los. ° °Printed calendar and avs. °

## 2019-05-28 ENCOUNTER — Ambulatory Visit: Payer: Medicare Other | Admitting: Podiatry

## 2019-06-09 ENCOUNTER — Other Ambulatory Visit: Payer: Self-pay

## 2019-06-09 ENCOUNTER — Ambulatory Visit (INDEPENDENT_AMBULATORY_CARE_PROVIDER_SITE_OTHER): Payer: Medicare Other

## 2019-06-09 DIAGNOSIS — I6523 Occlusion and stenosis of bilateral carotid arteries: Secondary | ICD-10-CM | POA: Diagnosis not present

## 2019-06-13 ENCOUNTER — Other Ambulatory Visit: Payer: Self-pay | Admitting: Cardiology

## 2019-06-13 DIAGNOSIS — I6523 Occlusion and stenosis of bilateral carotid arteries: Secondary | ICD-10-CM

## 2019-06-25 ENCOUNTER — Ambulatory Visit (INDEPENDENT_AMBULATORY_CARE_PROVIDER_SITE_OTHER): Payer: Medicare Other | Admitting: Podiatry

## 2019-06-25 ENCOUNTER — Encounter: Payer: Self-pay | Admitting: Podiatry

## 2019-06-25 ENCOUNTER — Other Ambulatory Visit: Payer: Self-pay

## 2019-06-25 DIAGNOSIS — M79675 Pain in left toe(s): Secondary | ICD-10-CM | POA: Diagnosis not present

## 2019-06-25 DIAGNOSIS — B351 Tinea unguium: Secondary | ICD-10-CM | POA: Diagnosis not present

## 2019-06-25 DIAGNOSIS — M79674 Pain in right toe(s): Secondary | ICD-10-CM

## 2019-06-25 NOTE — Progress Notes (Signed)
Complaint:  Visit Type: Patient returns to my office for continued preventative foot care services. Complaint: Patient states" my nails have grown long and thick and become painful to walk and wear shoes"The patient presents for preventative foot care services. No changes to ROS  Podiatric Exam: Vascular: dorsalis pedis and posterior tibial pulses are palpable bilateral. Capillary return is immediate. Temperature gradient is WNL. Skin turgor WNL  Sensorium: Normal Semmes Weinstein monofilament test. Normal tactile sensation bilaterally. Nail Exam: Pt has thick disfigured discolored nails with subungual debris noted bilateral entire nail hallux through fifth toenails Ulcer Exam: There is no evidence of ulcer or pre-ulcerative changes or infection. Orthopedic Exam: Muscle tone and strength are WNL. No limitations in general ROM. No crepitus or effusions noted. Foot type and digits show no abnormalities. HAV  B/L. Skin: No Porokeratosis. No infection or ulcers  Diagnosis:  Onychomycosis, , Pain in right toe, pain in left toes  Treatment & Plan Procedures and Treatment: Consent by patient was obtained for treatment procedures.   Debridement of mycotic and hypertrophic toenails, 1 through 5 bilateral and clearing of subungual debris. No ulceration, no infection noted.  Return Visit-Office Procedure: Patient instructed to return to the office for a follow up visit 3 months for continued evaluation and treatment.    Gardiner Barefoot DPM

## 2019-08-03 ENCOUNTER — Other Ambulatory Visit: Payer: Self-pay | Admitting: Cardiology

## 2019-08-03 ENCOUNTER — Ambulatory Visit: Payer: Medicare Other

## 2019-08-03 ENCOUNTER — Other Ambulatory Visit: Payer: Self-pay

## 2019-08-03 ENCOUNTER — Ambulatory Visit (INDEPENDENT_AMBULATORY_CARE_PROVIDER_SITE_OTHER): Payer: Medicare Other

## 2019-08-03 DIAGNOSIS — I10 Essential (primary) hypertension: Secondary | ICD-10-CM

## 2019-08-03 DIAGNOSIS — I2510 Atherosclerotic heart disease of native coronary artery without angina pectoris: Secondary | ICD-10-CM

## 2019-08-03 NOTE — Progress Notes (Unsigned)
Patient here for echo and orders not there. Will place echo.  CAD and dyspnea.     ICD-10-CM   1. Coronary artery disease involving native coronary artery of native heart without angina pectoris  I25.10 PCV ECHOCARDIOGRAM COMPLETE  2. Asymptomatic bilateral carotid artery stenosis  I65.23 PCV CAROTID DUPLEX (BILATERAL)  3. Essential hypertension  I10 PCV ECHOCARDIOGRAM COMPLETE

## 2019-08-16 ENCOUNTER — Telehealth: Payer: Self-pay | Admitting: Hematology

## 2019-08-16 ENCOUNTER — Telehealth: Payer: Self-pay | Admitting: *Deleted

## 2019-08-16 NOTE — Telephone Encounter (Signed)
Patient called - asked to r/s lab appt currently on 2/18. Schedule message sent

## 2019-08-16 NOTE — Telephone Encounter (Signed)
I talk with patient regarding reschedule to 1/19

## 2019-08-18 ENCOUNTER — Ambulatory Visit (INDEPENDENT_AMBULATORY_CARE_PROVIDER_SITE_OTHER): Payer: Medicare Other | Admitting: Cardiology

## 2019-08-18 ENCOUNTER — Ambulatory Visit: Payer: Federal, State, Local not specified - PPO | Admitting: Cardiology

## 2019-08-18 ENCOUNTER — Other Ambulatory Visit: Payer: Self-pay

## 2019-08-18 ENCOUNTER — Encounter: Payer: Self-pay | Admitting: Cardiology

## 2019-08-18 VITALS — BP 200/100 | HR 66 | Temp 96.7°F | Resp 16 | Ht 65.0 in | Wt 233.0 lb

## 2019-08-18 DIAGNOSIS — E7800 Pure hypercholesterolemia, unspecified: Secondary | ICD-10-CM | POA: Diagnosis not present

## 2019-08-18 DIAGNOSIS — I10 Essential (primary) hypertension: Secondary | ICD-10-CM

## 2019-08-18 DIAGNOSIS — I2510 Atherosclerotic heart disease of native coronary artery without angina pectoris: Secondary | ICD-10-CM | POA: Diagnosis not present

## 2019-08-18 DIAGNOSIS — I6523 Occlusion and stenosis of bilateral carotid arteries: Secondary | ICD-10-CM | POA: Diagnosis not present

## 2019-08-18 MED ORDER — HYDRALAZINE HCL 50 MG PO TABS: 50.0000 mg | ORAL_TABLET | Freq: Three times a day (TID) | ORAL | 3 refills | Status: DC

## 2019-08-18 MED ORDER — ISOSORBIDE DINITRATE 30 MG PO TABS: 30.0000 mg | ORAL_TABLET | Freq: Three times a day (TID) | ORAL | 2 refills | Status: DC

## 2019-08-18 NOTE — Patient Instructions (Signed)
Your LDL (Bad cholesterol) should be < 70. BP goal is 130/80 mm Hg.   Please try to loose weight.

## 2019-08-18 NOTE — Progress Notes (Signed)
Primary Physician/Referring:  Chesley Noon, MD  Patient ID: Brenda Barry, female    DOB: 09-29-1935, 84 y.o.   MRN: [id number]  Chief Complaint  Patient presents with  . Carotid Stenosis  . Hypertension    6 month Follow up   HPI:    Brenda Barry  is a 84 y.o.  African-American female with known coronary artery disease with remote coronary stenting 2001 and history of brachytherapy due to restenosis, difficult to control hypertension, asymptomatic bilateral carotid artery disease, PAD with both neurogenic claudication and vascular claudication, stage III chronic kidney disease due to hypertension, hyperlipidemia, presents here for 6 month OV. She has had difficulty in BP control.   This is a 47-monthoffice visit, remains angina free, has not had any significant palpitations or severe symptoms of claudication since last office visit.  Past Medical History:  Diagnosis Date  . Bilateral carotid bruits 02/12/2019  . [place] 02[place]   Past Surgical History:  Procedure Laterality Date  . HIP SURGERY Right 10/05/2018   Social History   Tobacco Use  . Smoking status: Former Smoker    Packs/day: 0.25    Years: 10.00    Pack years: 2.50    Types: Cigarettes    Quit date: 04/19/1971    Years since quitting: 48.3  . Smokeless tobacco: Never Used  Substance Use Topics  . Alcohol use: No   ROS  Review of Systems  Constitution: Positive for weight gain (10 lbs due to decreased activity).  Cardiovascular: Positive for dyspnea on exertion. Negative for leg swelling and syncope.  Endocrine: Negative for cold intolerance.  Hematologic/Lymphatic: Does not bruise/bleed easily.  Musculoskeletal: Positive for arthritis and back pain.  Gastrointestinal: Negative for abdominal pain, anorexia, change in bowel habit, hematochezia and melena.  Neurological: Positive for paresthesias (bilateral legs). Negative for headaches and light-headedness.    Psychiatric/Behavioral: Negative for depression and substance abuse. The patient is nervous/anxious.   All other systems reviewed and are negative.  Objective  Blood pressure (!) 200/100, pulse 66, temperature (!) 96.7 F (35.9 C), temperature source Oral, resp. rate 16, height _0  (1.651 m), weight 233 lb (105.7 kg), SpO2 100 %.  Vitals with BMI 08/18/2019 08/18/2019 08/18/2019  Height - - _1   Weight - - 233 lbs  BMI - - 365.03 Systolic [id number] Diastolic [id number] Pulse - 66 64     Physical Exam  Constitutional:  Moderately obese patient in no distress  HENT:  Head: Atraumatic.  Eyes: Conjunctivae are normal.  Cardiovascular: Normal rate, regular rhythm, S1 normal, S2 normal and intact distal pulses. Exam reveals no gallop.  Murmur heard.  Harsh early systolic murmur is present with a grade of 2/6 at the upper right sternal border radiating to the neck. Pulses:      Carotid pulses are on the left side with bruit.      Femoral pulses are 2+ on the right side and 2+ on the left side.      Popliteal pulses are 1+ on the right side and 1+ on the left side.       Dorsalis pedis pulses are 2+ on the right side and 2+ on the left side.       Posterior tibial pulses are 0 on the right side and 0 on the left side.  No JVD or leg edema.  Pulmonary/Chest: Effort normal and breath sounds normal.  Abdominal: Soft. Bowel sounds  are normal.  obese  Musculoskeletal:        General: Normal range of motion.     Cervical back: Neck supple.  Neurological: She is alert.  Skin: Skin is warm and dry.  Psychiatric: She has a normal mood and affect.   Laboratory examination:   Recent Labs    09/08/18 1031 03/09/19 1128  NA 143 142  K 3.8 4.1  CL 106 106  CO2 27 26  GLUCOSE 99 91  BUN 15 17  CREATININE 1.23* 1.30*  CALCIUM 9.4 9.4  GFRNONAA 41* 38*  GFRAA 48* 45*   CrCl cannot be calculated (Patient's most recent lab result is older than the maximum 21 days allowed.).   CMP Latest Ref Rng & Units 03/09/2019 09/08/2018 04/11/2018  Glucose 70 - 99 mg/dL 91 99 114(H)  BUN 8 - 23 mg/dL _0 Creatinine 0.44 - 1.00 mg/dL 1.30(H) 1.23(H) 1.28(H)  Sodium 135 - 145 mmol/L 142 143 137  Potassium 3.5 - 5.1 mmol/L 4.1 3.8 3.8  Chloride 98 - 111 mmol/L 106 106 97(L)  CO2 22 - 32 mmol/L _1 Calcium 8.9 - 10.3 mg/dL 9.4 9.4 9.5  Total Protein 6.5 - 8.1 g/dL 7.7 7.9 -  Total Bilirubin 0.3 - 1.2 mg/dL 0.4 0.4 -  Alkaline Phos 38 - 126 U/L 75 69 -  AST 15 - 41 U/L 22 20 -  ALT 0 - 44 U/L 18 13 -   CBC Latest Ref Rng & Units 03/09/2019 09/08/2018 04/11/2018  WBC 4.0 - 10.5 K/uL 5.9 5.2 11.0(H)  Hemoglobin 12.0 - 15.0 g/dL 11.1(L) 12.1 12.8  Hematocrit 36.0 - 46.0 % 35.0(L) 37.9 39.6  Platelets 150 - 400 K/uL 261 262 296   Lipid Panel  No results found for: CHOL, TRIG, HDL, CHOLHDL, VLDL, LDLCALC, LDLDIRECT HEMOGLOBIN A1C No results found for: HGBA1C, MPG TSH No results for input(s): TSH in the last 8760 hours.  External labs:   LP+Non-HDL CholesterolResulted: 02/01/2019 10:33 AM [facility] Component Name Value Ref Range Cholesterol, Total 189 100 - 199 mg/dL Triglycerides 80 0 - 149 mg/dL HDL 73 >39 mg/dL VLDL Cholesterol Cal 16 5 - 40 mg/dL LDL 100 (H) 0 - 99 mg/dL  Comprehensive Metabolic PanelResulted: 0/76/1518 10:33 AM [facility] Component Name Value Ref Range Glucose 85 65 - 99 mg/dL BUN 16 8 - 27 mg/dL Creatinine, Serum 1.03 (H) 0.57 - 1 mg/dL eGFR If NonAfrican American 51 (L) >59 mL/min/1.73 eGFR If African American 59 (L) >59 mL/min/1.73 BUN/Creatinine Ratio 16 12 - 28  Sodium 143 134 - 144 mmol/L Potassium 4.9 3.5 - 5.2 mmol/L Chloride 107 (H) 96 - 106 mmol/L CO2 25 20 - 29 mmol/L CALCIUM 9.6 8.7 - 10.3 mg/dL Total Protein 7.0 6 - 8.5 g/dL Albumin, Serum 4.0 3.7 - 4.7 g/dL Globulin, Total 3.0 1.5 - 4.5 g/dL Albumin/Globulin Ratio 1.3 1.2 - 2.2  Total Bilirubin 0.2 0 - 1.2 mg/dL Alkaline Phosphatase 71 39 - 117  IU/L AST 21 0 - 40 IU/L ALT (SGPT) 14 0 - 32 IU/L TSHResulted: 02/01/2019 10:33 AM [facility]  Medications and allergies   Allergies  Allergen Reactions  . Atorvastatin Other (See Comments)    nightmare nightmare  . Rosuvastatin Other (See Comments)    Nightmare.  Hence takes it during day     Current Outpatient Medications  Medication Instructions  . albuterol (PROVENTIL HFA;VENTOLIN HFA) 108 (90 Base) MCG/ACT inhaler 2 puffs, Inhalation, As needed  . aspirin 81  MG chewable tablet Oral  . budesonide (PULMICORT) 0.25 mg, Nebulization, As needed  . Crestor 20 mg, Oral, Daily at bedtime  . hydrALAZINE (APRESOLINE) 50 mg, Oral, 3 times daily  . isosorbide dinitrate (ISORDIL) 30 mg, Oral, 3 times daily  . latanoprost (XALATAN) 0.005 % ophthalmic solution 1 drop, Both Eyes, Daily at bedtime  . metoprolol succinate (TOPROL-XL) 100 mg, Oral, 2 times daily  . olmesartan-hydrochlorothiazide (BENICAR HCT) 40-12.5 MG tablet 1 tablet, Oral, Daily  . SYMBICORT 160-4.5 MCG/ACT inhaler No dose, route, or frequency recorded.  . Synthroid 100 mcg, Oral, Daily before breakfast    Radiology:  No results found.  Cardiac Studies:   Coronary stent in ? 2001 and restenosis and brachytherapy following year, no details available in EPIC.  Lower extremity arterial duplex 05/15/2018: Right mid SFA is occluded with distal reconstitution. No hemodynamically significant stenoses are identified in the left lower extremity arterial system. This exam reveals normal perfusion of the lower extremity (ABI 1.00 bilaterally).  Carotid artery duplex 06/09/2019:  Stenosis in the right internal carotid artery (16-49%).  Stenosis in the left internal carotid artery (50-69%).  Antegrade right vertebral artery flow. Antegrade left vertebral artery  flow.  No significant change from 01/05/2019. Follow up in six months is  appropriate if clinically indicated.  Echocardiogram 08/03/2019: Left ventricle  cavity is normal in size. Mild concentric hypertrophy of the left ventricle. Normal global wall motion. Normal LV systolic function with EF 61%. Doppler evidence of grade II (pseudonormal) diastolic dysfunction, elevated LAP.  Left atrial cavity is moderately dilated. Trileaflet aortic valve with mild aortic valve leaflet and noncoronary cusp calcification. Trace aortic stenosis. Moderate (Grade III) posteriorly directed mitral regurgitation. Mild to moderate tricuspid regurgitation. Estimated pulmonary artery systolic pressure is 32 mmHg.  Compared to previous study in 2019, LA/LV filling pressures are increased.  Assessment     ICD-10-CM   1. Coronary artery disease involving native coronary artery of native heart without angina pectoris  I25.10 EKG 12-Lead  2. Essential hypertension  I10 hydrALAZINE (APRESOLINE) 50 MG tablet    isosorbide dinitrate (ISORDIL) 30 MG tablet  3. Asymptomatic bilateral carotid artery stenosis  I65.23   4. Hypercholesteremia  E78.00     EKG 08/18/2019: Normal sinus rhythm with rate of 62 bpm, borderline criteria for left atrial enlargement, normal axis.  Poor with progression, cannot exclude anteroseptal infarct old.  No evidence of ischemia.  Normal QT interval. No significant change from EKG 02/12/2019.  Meds ordered this encounter  Medications  . hydrALAZINE (APRESOLINE) 50 MG tablet    Sig: Take 1 tablet (50 mg total) by mouth 3 (three) times daily.    Dispense:  270 tablet    Refill:  3  . isosorbide dinitrate (ISORDIL) 30 MG tablet    Sig: Take 1 tablet (30 mg total) by mouth 3 (three) times daily.    Dispense:  90 tablet    Refill:  2    Medications Discontinued During This Encounter  Medication Reason  . hydrALAZINE (APRESOLINE) 25 MG tablet Reorder     Recommendations:   Brenda Barry  is a 84 y.o. African-American female with known coronary artery disease with remote coronary stenting 2001 and history of brachytherapy due to restenosis,  difficult to control hypertension, asymptomatic bilateral carotid artery disease, PAD with both neurogenic claudication and vascular claudication, stage III chronic kidney disease due to hypertension, hyperlipidemia, presents here for 6 month OV. She has had difficulty in BP control.   Blood pressure  remains elevated, will increase hydralazine to 50 mg 3 times daily and also I have added isosorbide dinitrate 30 mg p.o. 3 times daily.  Side effects of the medication including dizziness and headache was described to the patient.   She does need to have her LDL goal <70 mg in view of carotid disease and also known coronary artery disease.  She is having her labs done by Dr. Melford Aase next week.  I would like to see her back in 6 weeks for follow-up.  I have reviewed her carotid artery duplex which is remained stable and echocardiogram also does not reveal any other significant abnormalities that I need to follow-up on closely for now.  From cardiac standpoint she otherwise remained stable without recurrence of angina and no significant symptoms of claudication presently.  Backache is chronic and stable.  Could consider renal artery duplex if BP is not controlled.  Adrian Prows, MD, [facility] 08/18/2019, 11:29 AM [facility]. [place] Office: [phone]

## 2019-08-24 DIAGNOSIS — N183 Chronic kidney disease, stage 3 unspecified: Secondary | ICD-10-CM | POA: Insufficient documentation

## 2019-09-09 ENCOUNTER — Other Ambulatory Visit: Payer: Medicare Other

## 2019-09-09 ENCOUNTER — Telehealth: Payer: Self-pay | Admitting: Hematology

## 2019-09-09 NOTE — Telephone Encounter (Signed)
Rescheduled per 2/18. Called and spoke with pt, confirmed 3/3 and 3/9 appt

## 2019-09-10 ENCOUNTER — Inpatient Hospital Stay: Payer: Medicare Other

## 2019-09-16 ENCOUNTER — Ambulatory Visit: Payer: Medicare Other | Admitting: Hematology

## 2019-09-22 ENCOUNTER — Other Ambulatory Visit: Payer: Self-pay

## 2019-09-22 ENCOUNTER — Inpatient Hospital Stay: Payer: Medicare Other | Attending: Hematology

## 2019-09-22 DIAGNOSIS — D472 Monoclonal gammopathy: Secondary | ICD-10-CM | POA: Insufficient documentation

## 2019-09-22 DIAGNOSIS — I10 Essential (primary) hypertension: Secondary | ICD-10-CM | POA: Insufficient documentation

## 2019-09-22 LAB — CMP (CANCER CENTER ONLY)
ALT: 14 U/L (ref 0–44)
AST: 19 U/L (ref 15–41)
Albumin: 3.7 g/dL (ref 3.5–5.0)
Alkaline Phosphatase: 63 U/L (ref 38–126)
Anion gap: 8 (ref 5–15)
BUN: 16 mg/dL (ref 8–23)
CO2: 29 mmol/L (ref 22–32)
Calcium: 9.4 mg/dL (ref 8.9–10.3)
Chloride: 106 mmol/L (ref 98–111)
Creatinine: 1.11 mg/dL — ABNORMAL HIGH (ref 0.44–1.00)
GFR, Est AFR Am: 54 mL/min — ABNORMAL LOW (ref >60)
GFR, Estimated: 47 mL/min — ABNORMAL LOW (ref >60)
Glucose, Bld: 92 mg/dL (ref 70–99)
Potassium: 4 mmol/L (ref 3.5–5.1)
Sodium: 143 mmol/L (ref 135–145)
Total Bilirubin: 0.5 mg/dL (ref 0.3–1.2)
Total Protein: 7.5 g/dL (ref 6.5–8.1)

## 2019-09-22 LAB — CBC WITH DIFFERENTIAL/PLATELET
Abs Immature Granulocytes: 0.02 10*3/uL (ref 0.00–0.07)
Basophils Absolute: 0 10*3/uL (ref 0.0–0.1)
Basophils Relative: 1 %
Eosinophils Absolute: 0.3 10*3/uL (ref 0.0–0.5)
Eosinophils Relative: 5 %
HCT: 34.8 % — ABNORMAL LOW (ref 36.0–46.0)
Hemoglobin: 11 g/dL — ABNORMAL LOW (ref 12.0–15.0)
Immature Granulocytes: 0 %
Lymphocytes Relative: 37 %
Lymphs Abs: 2.4 10*3/uL (ref 0.7–4.0)
MCH: 32.5 pg (ref 26.0–34.0)
MCHC: 31.6 g/dL (ref 30.0–36.0)
MCV: 103 fL — ABNORMAL HIGH (ref 80.0–100.0)
Monocytes Absolute: 0.5 10*3/uL (ref 0.1–1.0)
Monocytes Relative: 8 %
Neutro Abs: 3.1 10*3/uL (ref 1.7–7.7)
Neutrophils Relative %: 49 %
Platelets: 229 10*3/uL (ref 150–400)
RBC: 3.38 MIL/uL — ABNORMAL LOW (ref 3.87–5.11)
RDW: 14.1 % (ref 11.5–15.5)
WBC: 6.3 10*3/uL (ref 4.0–10.5)
nRBC: 0 % (ref 0.0–0.2)

## 2019-09-23 LAB — KAPPA/LAMBDA LIGHT CHAINS
Kappa free light chain: 33.3 mg/L — ABNORMAL HIGH (ref 3.3–19.4)
Kappa, lambda light chain ratio: 2.07 — ABNORMAL HIGH (ref 0.26–1.65)
Lambda free light chains: 16.1 mg/L (ref 5.7–26.3)

## 2019-09-24 ENCOUNTER — Ambulatory Visit (INDEPENDENT_AMBULATORY_CARE_PROVIDER_SITE_OTHER): Payer: Medicare Other | Admitting: Podiatry

## 2019-09-24 ENCOUNTER — Other Ambulatory Visit: Payer: Self-pay

## 2019-09-24 ENCOUNTER — Encounter: Payer: Self-pay | Admitting: Podiatry

## 2019-09-24 DIAGNOSIS — B351 Tinea unguium: Secondary | ICD-10-CM

## 2019-09-24 DIAGNOSIS — M79675 Pain in left toe(s): Secondary | ICD-10-CM | POA: Diagnosis not present

## 2019-09-24 DIAGNOSIS — M79674 Pain in right toe(s): Secondary | ICD-10-CM | POA: Diagnosis not present

## 2019-09-24 NOTE — Progress Notes (Signed)
Complaint:  Visit Type: Patient returns to my office for continued preventative foot care services. Complaint: Patient states" my nails have grown long and thick and become painful to walk and wear shoes"The patient presents for preventative foot care services. No changes to ROS.  Patient has history of  PAD.  Podiatric Exam: Vascular: dorsalis pedis and posterior tibial pulses are palpable bilateral. Capillary return is immediate. Temperature gradient is WNL. Skin turgor WNL  Sensorium: Normal Semmes Weinstein monofilament test. Normal tactile sensation bilaterally. Nail Exam: Pt has thick disfigured discolored nails with subungual debris noted bilateral entire nail hallux through fifth toenails Ulcer Exam: There is no evidence of ulcer or pre-ulcerative changes or infection. Orthopedic Exam: Muscle tone and strength are WNL. No limitations in general ROM. No crepitus or effusions noted. Foot type and digits show no abnormalities. HAV  B/L. Skin: No Porokeratosis. No infection or ulcers  Diagnosis:  Onychomycosis, , Pain in right toe, pain in left toes  Treatment & Plan Procedures and Treatment: Consent by patient was obtained for treatment procedures.   Debridement of mycotic and hypertrophic toenails, 1 through 5 bilateral and clearing of subungual debris. No ulceration, no infection noted.  Return Visit-Office Procedure: Patient instructed to return to the office for a follow up visit 3 months for continued evaluation and treatment.    Gardiner Barefoot DPM

## 2019-09-27 LAB — MULTIPLE MYELOMA PANEL, SERUM
Albumin SerPl Elph-Mcnc: 3.3 g/dL (ref 2.9–4.4)
Albumin/Glob SerPl: 1 (ref 0.7–1.7)
Alpha 1: 0.3 g/dL (ref 0.0–0.4)
Alpha2 Glob SerPl Elph-Mcnc: 0.8 g/dL (ref 0.4–1.0)
B-Globulin SerPl Elph-Mcnc: 1.1 g/dL (ref 0.7–1.3)
Gamma Glob SerPl Elph-Mcnc: 1.3 g/dL (ref 0.4–1.8)
Globulin, Total: 3.5 g/dL (ref 2.2–3.9)
IgA: 180 mg/dL (ref 64–422)
IgG (Immunoglobin G), Serum: 1439 mg/dL (ref 586–1602)
IgM (Immunoglobulin M), Srm: 49 mg/dL (ref 26–217)
M Protein SerPl Elph-Mcnc: 0.9 g/dL — ABNORMAL HIGH
Total Protein ELP: 6.8 g/dL (ref 6.0–8.5)

## 2019-09-28 ENCOUNTER — Other Ambulatory Visit: Payer: Self-pay

## 2019-09-28 ENCOUNTER — Inpatient Hospital Stay (HOSPITAL_BASED_OUTPATIENT_CLINIC_OR_DEPARTMENT_OTHER): Payer: Medicare Other | Admitting: Hematology

## 2019-09-28 ENCOUNTER — Telehealth: Payer: Self-pay | Admitting: Hematology

## 2019-09-28 VITALS — BP 213/92 | HR 79 | Temp 99.8°F | Resp 18 | Ht 65.0 in | Wt 228.9 lb

## 2019-09-28 DIAGNOSIS — D7589 Other specified diseases of blood and blood-forming organs: Secondary | ICD-10-CM

## 2019-09-28 DIAGNOSIS — E538 Deficiency of other specified B group vitamins: Secondary | ICD-10-CM | POA: Diagnosis not present

## 2019-09-28 DIAGNOSIS — I6523 Occlusion and stenosis of bilateral carotid arteries: Secondary | ICD-10-CM

## 2019-09-28 DIAGNOSIS — D472 Monoclonal gammopathy: Secondary | ICD-10-CM | POA: Diagnosis not present

## 2019-09-28 NOTE — Telephone Encounter (Signed)
Scheduled per los. Gave avs and calendar  

## 2019-09-28 NOTE — Progress Notes (Signed)
HEMATOLOGY/ONCOLOGY CONSULTATION NOTE  Date of Service: 09/28/2019  Patient Care Team: Chesley Noon, MD as PCP - General (Family Medicine)  CHIEF COMPLAINTS/PURPOSE OF CONSULTATION:  Monoclonal Paraproteinemia   HISTORY OF PRESENTING ILLNESS:  Brenda Barry is a wonderful 84 y.o. female who has been referred to [place] by Dr. Anastasia Pall for evaluation and management of Monoclonal Paraproteinemia. The pt reports that she is doing well overall.   The pt reports that she has had sciatica in the last year with referred pain down her leg, caused by a pinched nerve at L4/5.  The pt is intending to see Dr. Leonel Ramsay in neurosurgery soon. The pt's lower back pain has been evaluated with an MRI most recently on 04/24/18. The pt is also being evaluated by cardiology.  The pt denies any neck pain whatsoever and denies any new bone pains.   The pt notes that her kidney functions have been stable recently and has been pursuing care with Dr. Erling Cruz at [facility]. The pt notes that her blood pressure has been high, and is elevated more when she sees physicians. She has taken HCTZ, Toprol-XL, and Hydralazine. The pt notes that her thyroid medication has recently been adjusted.   Of note prior to the patient's presentation, the pt had a Bone Survey on 01/03/17 which revealed No definite lytic lesions within the axial skeleton. 2. Mixed lytic and blastic lesion posteriorly at C7. CT or MRI could be used for further evaluation of the cervical spine. 3. 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 No discrete lytic lesions are present.   Subsequently the pt then had a CT Cervical Spine on 02/21/17 which revealed CT scan does not show a convincing abnormality within the C7 vertebral body. However, within the posterior C3 vertebral body, there is a well-circumscribed lucent area measuring 8 x 4 x 5 mm which, though not specific or  conclusive, could represent a focus of myeloma.  Most recent lab results (04/21/18) of CMP is as follows: all values are WNL except for Glucose at 104, Creatinine at 1.07, GFR at 57, BUN at 10, Sodium at 145, Phosphorous at 2.2. 04/21/18 SPEP revealed all values WNL except for M-spike at 0.7, with IgG monoclonal protein with kappa light chain specificity.   On review of systems, pt reports sciatica, stable energy levels, stable lower back pain, and denies any neck pain, new bone pains, abdominal pains, chest wall pain, and any other symptoms.   On PMHx the pt reports Osteoporosis, Hypothyroidism, Hyperlipidemia, CAD with 5p stent place in late 90s, benign lumpectomy of right breast, HTN, IgG monoclonal gammopathy, CKD stage III. On Social Hx the pt reports that she quit smoking cigarettes about 50 years ago.  Interval History:   Brenda Barry returns today for management and evaluation of her MGUS. The patient's last visit with [place] was on 03/16/2019. The pt reports that she is doing well overall.  The pt reports that her right shoulder/back pain has been about the same and worsens after sleeping in a particular manner. Pt notes that her BP is still high even though she has been placed on Amlodipine. Pt has some mild headaches, which are not bothersome, that she attributes to medications. She affirms that she has been taking her thyroid medication as prescribed. She has continued eating and drinking well. Pt has received both doses of the COVID19 vaccine and had no issues.   Lab results (09/22/19) of  CBC w/diff and CMP is as follows: all values are WNL except for RBC at 3.38, Hgb at 11.0, HCT at 34.8, MCV at 103.0, Creatinine at 1.11, GFR Est Af Am at 54. 09/22/2019 K/L light chains is follows: Kappa free light chain at 33.3, Lamda free light chains at 16.1, K/L light chain ratio at 2.07. 09/22/2019 MMP is as follows: all values are WNL except for M Protein at 0.9.  On review of systems, pt  reports healthy appetite, headaches and denies new neck pain, new back pain, worsening leg swelling and any other symptoms.    MEDICAL HISTORY:  HTN HLD Anxiety Sciatica  SURGICAL HISTORY: Past Surgical History:  Procedure Laterality Date  . HIP SURGERY Right 10/05/2018    SOCIAL HISTORY: Social History   Socioeconomic History  . Marital status: Widowed    Spouse name: Not on file  . Number of children: 3  . Years of education: Not on file  . Highest education level: Not on file  Occupational History  . Not on file  Tobacco Use  . Smoking status: Former Smoker    Packs/day: 0.25    Years: 10.00    Pack years: 2.50    Types: Cigarettes    Quit date: 04/19/1971    Years since quitting: 48.4  . Smokeless tobacco: Never Used  Substance and Sexual Activity  . Alcohol use: No  . Drug use: No  . Sexual activity: Not on file  Other Topics Concern  . Not on file  Social History Narrative  . Not on file   Social Determinants of Health   Financial Resource Strain:   . Difficulty of Paying Living Expenses: Not on file  Food Insecurity:   . Worried About Charity fundraiser in the Last Year: Not on file  . Ran Out of Food in the Last Year: Not on file  Transportation Needs:   . Lack of Transportation (Medical): Not on file  . Lack of Transportation (Non-Medical): Not on file  Physical Activity:   . Days of Exercise per Week: Not on file  . Minutes of Exercise per Session: Not on file  Stress:   . Feeling of Stress : Not on file  Social Connections:   . Frequency of Communication with Friends and Family: Not on file  . Frequency of Social Gatherings with Friends and Family: Not on file  . Attends Religious Services: Not on file  . Active Member of Clubs or Organizations: Not on file  . Attends Archivist Meetings: Not on file  . Marital Status: Not on file  Intimate Partner Violence:   . Fear of Current or Ex-Partner: Not on file  . Emotionally Abused:  Not on file  . Physically Abused: Not on file  . Sexually Abused: Not on file    FAMILY HISTORY: Family History  Problem Relation Age of Onset  . Tuberculosis Mother   . Heart disease Father     ALLERGIES:  is allergic to atorvastatin and rosuvastatin.  MEDICATIONS:  Current Outpatient Medications  Medication Sig Dispense Refill  . albuterol (PROVENTIL HFA;VENTOLIN HFA) 108 (90 Base) MCG/ACT inhaler Inhale 2 puffs into the lungs as needed.    Marland Kitchen amLODipine (NORVASC) 2.5 MG tablet Take by mouth.    Marland Kitchen aspirin 81 MG EC tablet Take by mouth.    . budesonide (PULMICORT) 0.25 MG/2ML nebulizer solution Take 0.25 mg by nebulization as needed.    . CRESTOR 20 MG tablet Take 20 mg by  mouth at bedtime.    . hydrALAZINE (APRESOLINE) 25 MG tablet Take by mouth.    . isosorbide dinitrate (ISORDIL) 30 MG tablet Take 1 tablet (30 mg total) by mouth 3 (three) times daily. 90 tablet 2  . isosorbide mononitrate (IMDUR) 30 MG 24 hr tablet Take by mouth.    . latanoprost (XALATAN) 0.005 % ophthalmic solution Place 1 drop into both eyes at bedtime.    Marland Kitchen levothyroxine (SYNTHROID) 100 MCG tablet Take by mouth.    . metoprolol succinate (TOPROL-XL) 100 MG 24 hr tablet Take 100 mg by mouth 2 (two) times daily.     Marland Kitchen olmesartan-hydrochlorothiazide (BENICAR HCT) 40-12.5 MG tablet Take 1 tablet by mouth daily. 90 tablet 2  . SYMBICORT 160-4.5 MCG/ACT inhaler      No current facility-administered medications for this visit.    REVIEW OF SYSTEMS:   A 10+ POINT REVIEW OF SYSTEMS WAS OBTAINED including neurology, dermatology, psychiatry, cardiac, respiratory, lymph, extremities, GI, GU, Musculoskeletal, constitutional, breasts, reproductive, HEENT.  All pertinent positives are noted in the HPI.  All others are negative.   PHYSICAL EXAMINATION:  . There were no vitals filed for this visit. There were no vitals filed for this visit. .There is no height or weight on file to calculate BMI.  Exam was given in a  chair   GENERAL:alert, in no acute distress and comfortable SKIN: no acute rashes, no significant lesions EYES: conjunctiva are pink and non-injected, sclera anicteric OROPHARYNX: MMM, no exudates, no oropharyngeal erythema or ulceration NECK: supple, no JVD LYMPH:  no palpable lymphadenopathy in the cervical, axillary or inguinal regions LUNGS: clear to auscultation b/l with normal respiratory effort HEART: regular rate & rhythm ABDOMEN:  normoactive bowel sounds , non tender, not distended. No palpable hepatosplenomegaly.  Extremity: minimal pedal edema b/l PSYCH: alert & oriented x 3 with fluent speech NEURO: no focal motor/sensory deficits  LABORATORY DATA:  I have reviewed the data as listed  . CBC Latest Ref Rng & Units 09/22/2019 03/09/2019 09/08/2018  WBC 4.0 - 10.5 K/uL 6.3 5.9 5.2  Hemoglobin 12.0 - 15.0 g/dL 11.0(L) 11.1(L) 12.1  Hematocrit 36.0 - 46.0 % 34.8(L) 35.0(L) 37.9  Platelets 150 - 400 K/uL 229 261 262    . CMP Latest Ref Rng & Units 09/22/2019 03/09/2019 09/08/2018  Glucose 70 - 99 mg/dL 92 91 99  BUN 8 - 23 mg/dL '16 17 15  '$ Creatinine 0.44 - 1.00 mg/dL 1.11(H) 1.30(H) 1.23(H)  Sodium 135 - 145 mmol/L 143 142 143  Potassium 3.5 - 5.1 mmol/L 4.0 4.1 3.8  Chloride 98 - 111 mmol/L 106 106 106  CO2 22 - 32 mmol/L '29 26 27  '$ Calcium 8.9 - 10.3 mg/dL 9.4 9.4 9.4  Total Protein 6.5 - 8.1 g/dL 7.5 7.7 7.9  Total Bilirubin 0.3 - 1.2 mg/dL 0.5 0.4 0.4  Alkaline Phos 38 - 126 U/L 63 75 69  AST 15 - 41 U/L '19 22 20  '$ ALT 0 - 44 U/L '14 18 13    '$ 03/16/2019 MMP       04/17/18 CBC:    04/21/18 CMP:    04/21/18 SPEP:   04/21/18 Immunofixation:    RADIOGRAPHIC STUDIES: I have personally reviewed the radiological images as listed and agreed with the findings in the report. No results found.  ASSESSMENT & PLAN:   84 y.o. female with  1. MGUS- Monoclonal Gammopathy or undetermined significance. 01/03/17 Bone Survey revealed No definite lytic lesions within  the axial skeleton. 2. Mixed  lytic and blastic lesion posteriorly at C7. CT or MRI could be used for further evaluation of the cervical spine. 3. 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 No discrete lytic lesions are present.  02/21/17 CT Cervical Spine revealed CT scan does not show a convincing abnormality within the C7 vertebral body. However, within the posterior C3 vertebral body, there is a well-circumscribed lucent area measuring 8 x 4 x 5 mm which, though not specific or conclusive, could represent a focus of myeloma.  04/24/18 MRI Lumbar Spine revealed degenerative disc disease, multilevel disc bulges and disc osteophytes and advised that the pt speak with her PCP regarding 2.9cm cystic lesion in right adnexa  Labs from initial presentation from 04/21/18: M Protein at 0.7g  PLAN: -Discussed pt labwork, 09/22/19; blood counts are stable, some element of macrocytosis, blood chemistries are steady -Discussed 09/22/2019 K/L light chains are stable, K/L light chain ratio at 2.07. -Discussed 09/22/2019 MMP shows M Protein stable at 0.9 g/dL.  -The pt shows no overt clinical or lab transformation of her MGUS at this time.  -Will continue to monitor pt with labs and clinical every 6 months for progression to Multiple Myeloma. -Discussed the CRAB criteria: pt has no hypercalcemia, no significant change in renal function, mild anemia, no overt bone lesions -Pt has received both doses of the COVID19 vaccine -Recommend pt take B-complex vitamin daily -Continue Synthroid as prescribed. Recommend pt hold food and liquids for 1-1/2 hour after taking medication.  -Recommend pt continue to f/u with PCP for HTN management -Will see back in 6 months with labs 1 week prior   2. HTN -chronically uncontrolled . Element of white coat hypertension. Patient notes BP much better at home. No CP/SOB or headaches. Rpt BP in clinic  170/84 PLAN -continue compliance with anti HTN -continue f/u with PCP to optimize BP control   FOLLOW UP: RTC with Dr Irene Limbo with labs in 6 months. Plz schedule labs 1 week prior to clinic visit   The total time spent in the appt was 20 minutes and more than 50% was on counseling and direct patient cares.  All of the patient's questions were answered with apparent satisfaction. The patient knows to call the clinic with any problems, questions or concerns.    Sullivan Lone MD [place] AAHIVMS [facility] Hematology/Oncology Physician [facility]  (Office):       [phone] (Work cell):  [phone] (Fax):           [phone]  09/28/2019 6:58 AM  I, Yevette Edwards, am acting as a scribe for Dr. Sullivan Lone.   .I have reviewed the above documentation for accuracy and completeness, and I agree with the above. Brunetta Genera MD

## 2019-10-15 ENCOUNTER — Encounter: Payer: Self-pay | Admitting: Cardiology

## 2019-10-15 ENCOUNTER — Other Ambulatory Visit: Payer: Self-pay

## 2019-10-15 ENCOUNTER — Ambulatory Visit: Payer: Medicare Other | Admitting: Cardiology

## 2019-10-15 VITALS — BP 177/78 | HR 61 | Temp 96.9°F | Ht 65.5 in | Wt 225.0 lb

## 2019-10-15 DIAGNOSIS — E7800 Pure hypercholesterolemia, unspecified: Secondary | ICD-10-CM

## 2019-10-15 DIAGNOSIS — I10 Essential (primary) hypertension: Secondary | ICD-10-CM

## 2019-10-15 MED ORDER — LABETALOL HCL 200 MG PO TABS: 200.0000 mg | ORAL_TABLET | Freq: Two times a day (BID) | ORAL | 3 refills | Status: DC

## 2019-10-15 NOTE — Progress Notes (Signed)
Primary Physician/Referring:  Chesley Noon, MD  Patient ID: Brenda Barry, female    DOB: 1935-10-18, 84 y.o.   MRN: [id number]  Chief Complaint  Patient presents with  . Hypertension  . Hyperlipidemia  . Follow-up    6 week, discuss meds   HPI:    Brenda Barry  is a 84 y.o.  African-American female with known coronary artery disease with remote coronary stenting 2001 and history of brachytherapy due to restenosis, difficult to control hypertension, asymptomatic bilateral carotid artery disease, PAD with both neurogenic claudication and vascular claudication, stage III chronic kidney disease due to hypertension, hyperlipidemia. She has had difficulty in BP control.   She has been having difficulty in controlling her blood pressure, did not tolerate amlodipine or isosorbide dinitrate.  She is starting hydralazine.  States that her blood pressure continues to remain elevated.  Denies chest pain, symptoms of claudication and back pain have remained stable.  No change in weight.  Past Medical History:  Diagnosis Date  . Bilateral carotid bruits 02/12/2019  . Peripheral artery disease ([place]) 02/12/2019   Past Surgical History:  Procedure Laterality Date  . HIP SURGERY Right 10/05/2018   Social History   Tobacco Use  . Smoking status: Former Smoker    Packs/day: 0.25    Years: 10.00    Pack years: 2.50    Types: Cigarettes    Quit date: 04/19/1971    Years since quitting: 48.5  . Smokeless tobacco: Never Used  Substance Use Topics  . Alcohol use: No   ROS  Review of Systems  Constitution: Negative for weight gain.  Cardiovascular: Positive for claudication and dyspnea on exertion. Negative for leg swelling.  Musculoskeletal: Positive for arthritis and back pain.  Gastrointestinal: Negative for melena.  Neurological: Positive for paresthesias (bilateral legs). Negative for headaches.  Psychiatric/Behavioral: The patient is nervous/anxious.   All other systems  reviewed and are negative.  Objective  Blood pressure (!) 177/78, pulse 61, temperature (!) 96.9 F (36.1 C), height 5' 5.5" (1.664 m), weight 225 lb (102.1 kg), SpO2 99 %.  Vitals with BMI 10/15/2019 09/28/2019 08/18/2019  Height 5' 5.5" 5\' 5"  -  Weight 225 lbs 228 lbs 14 oz -  BMI [id number] -  Systolic 123XX123 [id number] [id number]  Diastolic 78 92 [id number]  Pulse 61 79 -     Physical Exam  Constitutional:  Moderately obese patient in no distress  Cardiovascular: Normal rate, regular rhythm, S1 normal, S2 normal and intact distal pulses. Exam reveals no gallop.  Murmur heard.  Harsh early systolic murmur is present with a grade of 2/6 at the upper right sternal border radiating to the neck. Pulses:      Carotid pulses are on the left side with bruit.      Femoral pulses are 2+ on the right side and 2+ on the left side.      Popliteal pulses are 1+ on the right side and 1+ on the left side.       Dorsalis pedis pulses are 2+ on the right side and 2+ on the left side.       Posterior tibial pulses are 0 on the right side and 0 on the left side.  No JVD or leg edema.  Pulmonary/Chest: Effort normal and breath sounds normal.  Abdominal: Soft. Bowel sounds are normal.  obese   Laboratory examination:   Recent Labs    03/09/19 1128 09/22/19 0935  NA 142 143  K 4.1  4.0  CL 106 106  CO2 26 29  GLUCOSE 91 92  BUN 17 16  CREATININE 1.30* 1.11*  CALCIUM 9.4 9.4  GFRNONAA 38* 47*  GFRAA 45* 54*   CrCl cannot be calculated (Patient's most recent lab result is older than the maximum 21 days allowed.).  CMP Latest Ref Rng & Units 09/22/2019 03/09/2019 09/08/2018  Glucose 70 - 99 mg/dL 92 91 99  BUN 8 - 23 mg/dL 16 17 15   Creatinine 0.44 - 1.00 mg/dL 1.11(H) 1.30(H) 1.23(H)  Sodium 135 - 145 mmol/L 143 142 143  Potassium 3.5 - 5.1 mmol/L 4.0 4.1 3.8  Chloride 98 - 111 mmol/L 106 106 106  CO2 22 - 32 mmol/L 29 26 27   Calcium 8.9 - 10.3 mg/dL 9.4 9.4 9.4  Total Protein 6.5 - 8.1 g/dL 7.5 7.7 7.9  Total  Bilirubin 0.3 - 1.2 mg/dL 0.5 0.4 0.4  Alkaline Phos 38 - 126 U/L 63 75 69  AST 15 - 41 U/L 19 22 20   ALT 0 - 44 U/L 14 18 13    CBC Latest Ref Rng & Units 09/22/2019 03/09/2019 09/08/2018  WBC 4.0 - 10.5 K/uL 6.3 5.9 5.2  Hemoglobin 12.0 - 15.0 g/dL 11.0(L) 11.1(L) 12.1  Hematocrit 36.0 - 46.0 % 34.8(L) 35.0(L) 37.9  Platelets 150 - 400 K/uL 229 261 262    External labs:   LP+Non-HDL CholesterolResulted: 02/01/2019 10:33 AM [facility] Component Name Value Ref Range Cholesterol, Total 189 100 - 199 mg/dL Triglycerides 80 0 - 149 mg/dL HDL 73 >39 mg/dL VLDL Cholesterol Cal 16 5 - 40 mg/dL LDL 100 (H) 0 - 99 mg/dL  Medications and allergies   Allergies  Allergen Reactions  . Amlodipine Besylate Other (See Comments)    Nightmares and nerve pain  . Atorvastatin Other (See Comments)    nightmare nightmare  . Isosorbide Dinitrate Other (See Comments)    Nerve pain, lower back pain, feels out of it  . Rosuvastatin Other (See Comments)    Nightmare.  Hence takes it during day     Current Outpatient Medications  Medication Instructions  . albuterol (PROVENTIL HFA;VENTOLIN HFA) 108 (90 Base) MCG/ACT inhaler 2 puffs, Inhalation, As needed  . aspirin 81 MG EC tablet Oral  . budesonide (PULMICORT) 0.25 mg, Nebulization, As needed  . Crestor 20 mg, Oral, Daily at bedtime  . hydrALAZINE (APRESOLINE) 50 mg, Oral, 3 times daily  . labetalol (NORMODYNE) 200 mg, Oral, 2 times daily  . latanoprost (XALATAN) 0.005 % ophthalmic solution 1 drop, Both Eyes, Daily at bedtime  . levothyroxine (SYNTHROID) 100 MCG tablet Oral  . olmesartan-hydrochlorothiazide (BENICAR HCT) 40-12.5 MG tablet 1 tablet, Oral, Daily  . SYMBICORT 160-4.5 MCG/ACT inhaler No dose, route, or frequency recorded.    Radiology:  No results found.  Cardiac Studies:   Coronary stent in ? 2001 and restenosis and brachytherapy following year, no details available in EPIC.  Lower extremity arterial duplex  05/15/2018: Right mid SFA is occluded with distal reconstitution. No hemodynamically significant stenoses are identified in the left lower extremity arterial system. This exam reveals normal perfusion of the lower extremity (ABI 1.00 bilaterally).  Carotid artery duplex 06/09/2019:  Stenosis in the right internal carotid artery (16-49%).  Stenosis in the left internal carotid artery (50-69%).  Antegrade right vertebral artery flow. Antegrade left vertebral artery  flow.  No significant change from 01/05/2019. Follow up in six months is  appropriate if clinically indicated.  Echocardiogram 08/03/2019: Left ventricle cavity is normal in  size. Mild concentric hypertrophy of the left ventricle. Normal global wall motion. Normal LV systolic function with EF 61%. Doppler evidence of grade II (pseudonormal) diastolic dysfunction, elevated LAP.  Left atrial cavity is moderately dilated. Trileaflet aortic valve with mild aortic valve leaflet and noncoronary cusp calcification. Trace aortic stenosis. Moderate (Grade III) posteriorly directed mitral regurgitation. Mild to moderate tricuspid regurgitation. Estimated pulmonary artery systolic pressure is 32 mmHg.  Compared to previous study in 2019, LA/LV filling pressures are increased.  Assessment     ICD-10-CM   1. Resistant hypertension  I10 PCV RENAL/RENAL ARTERY DUPLEX COMPLETE    labetalol (NORMODYNE) 200 MG tablet    TSH  2. Hypercholesteremia  E78.00 Lipid Panel With LDL/HDL Ratio    EKG 08/18/2019: Normal sinus rhythm with rate of 62 bpm, borderline criteria for left atrial enlargement, normal axis.  Poor with progression, cannot exclude anteroseptal infarct old.  No evidence of ischemia.  Normal QT interval. No significant change from EKG 02/12/2019.  Meds ordered this encounter  Medications  . labetalol (NORMODYNE) 200 MG tablet    Sig: Take 1 tablet (200 mg total) by mouth 2 (two) times daily.    Dispense:  60 tablet    Refill:  3     Discontinue Metoprolol    Medications Discontinued During This Encounter  Medication Reason  . amLODipine (NORVASC) 2.5 MG tablet Side effect (s)  . isosorbide dinitrate (ISORDIL) 30 MG tablet Side effect (s)  . isosorbide dinitrate (ISORDIL) 30 MG tablet Side effect (s)  . amLODipine (NORVASC) 2.5 MG tablet Side effect (s)  . isosorbide dinitrate (ISORDIL) 30 MG tablet Side effect (s)  . metoprolol succinate (TOPROL-XL) 100 MG 24 hr tablet Discontinued by provider     Recommendations:   Brenda Barry  is a 84 y.o. African-American female with known coronary artery disease with remote coronary stenting 2001 and history of brachytherapy due to restenosis, difficult to control hypertension, asymptomatic bilateral carotid artery disease, PAD with both neurogenic claudication and vascular claudication, stage III chronic kidney disease due to hypertension, hyperlipidemia, presents here for 6 month OV. She has had difficulty in BP control.   Patient did not tolerate Imdur and amlodipine due to severe fatigue and back pain, hence discontinued.  Blood pressure continues remain abated.  I will discontinue metoprolol and switch her to labetalol and try 20 mg p.o. twice daily.  I would also set up for a renal artery duplex.  She needs lipid profile testing, this has been ordered today.  I placed her on full care management for management of her hypertension as we are having so much difficulty in controlling her blood pressure.  Patient has underlying CAD, PAD and also carotid disease and is extreme high risk individual for cardiovascular events.  I would like to see her back in 6 weeks.  Adrian Prows, MD, [facility] 10/15/2019, 11:19 AM [facility]. [place] Office: [phone]

## 2019-10-15 NOTE — Patient Instructions (Addendum)
Renal Duplex Ultrasound Instructions: Prior to your ultrasound of the kidneys, please observe clear liquid diet only for the previous day that includes clear soup, meat is allowed and eggs are allowed.  No rice, pasta, vegetables.  Do this until the time of the test.  Take Gas-X pills 2 tablets 3 times a day 1 day prior and on the day of the test.

## 2019-10-16 LAB — LIPID PANEL WITH LDL/HDL RATIO
Cholesterol, Total: 187 mg/dL (ref 100–199)
HDL: 79 mg/dL (ref >39)
LDL Chol Calc (NIH): 94 mg/dL (ref 0–99)
LDL/HDL Ratio: 1.2 ratio (ref 0.0–3.2)
Triglycerides: 76 mg/dL (ref 0–149)
VLDL Cholesterol Cal: 14 mg/dL (ref 5–40)

## 2019-10-16 LAB — TSH: TSH: 0.59 u[IU]/mL (ref 0.450–4.500)

## 2019-10-27 ENCOUNTER — Ambulatory Visit: Payer: Medicare Other

## 2019-10-27 ENCOUNTER — Other Ambulatory Visit: Payer: Self-pay

## 2019-10-27 DIAGNOSIS — I10 Essential (primary) hypertension: Secondary | ICD-10-CM

## 2019-11-14 ENCOUNTER — Other Ambulatory Visit: Payer: Self-pay | Admitting: Cardiology

## 2019-11-14 DIAGNOSIS — I10 Essential (primary) hypertension: Secondary | ICD-10-CM

## 2019-11-18 ENCOUNTER — Telehealth: Payer: Self-pay | Admitting: Pharmacist

## 2019-11-18 NOTE — Telephone Encounter (Signed)
Called for [facility] follow up. Talked to pt. BP readings at home have been controlled and at goal. Pt reports to be feeling well overall. Denies any recent hospitalization, falls, or further complications. Med list reviewed and updated. Pt stated that she "felt bad" when taking labetalol and hydralazine. Complained of having back pain, nerve pain in the leg, feeling fatigue and worn out. Pt self adjusted her labetalol dose to 100 mg nightly and hydralazine 50 mg BID. Her nephrologist recently started low dose amlodipine, but pt wasn't able to tolerate it and discontinued it on her own. Pt complaining of SOB and dyspnea upon exertion. States that symptoms have been chronic and no recent change in the severity or frequency of symptoms. Has a hx of asthma. Followed by PCP. Encourage pt to follow up with her PCP to better titrate her asthma medications for symptom improvement. Denies complains of CP, HA. Mild leg swelling after long walks, recommended pt to wear compressions stockings to help with the swelling. Recent episode of lightheadedness, but pt attributes the symptoms to recent Metamucil dose that was higher than she was normally used to. No recurrence of lightheaded symptoms. Pt is scheduled for f/u appt with Dr. Einar Gip on 12/03/19. Will continue to monitor and follow up as needed

## 2019-12-02 NOTE — Progress Notes (Signed)
Primary Physician/Referring:  Chesley Noon, MD  Patient ID: Brenda Barry, female    DOB: [birth year], 84 y.o.   MRN: [id number]  Chief Complaint  Patient presents with  . Hypertension  . Follow-up    6 week   HPI:    Brenda Barry  is a 84 y.o.  African-American female with known coronary artery disease with remote coronary stenting 2001 and history of brachytherapy due to restenosis, difficult to control hypertension, asymptomatic bilateral carotid artery disease, PAD with both neurogenic claudication and vascular claudication, stage III chronic kidney disease due to hypertension, hyperlipidemia. She has had difficulty in BP control.   Since being on labetalol patient states that her blood pressure has been very well controlled at home.  She is also enrolled in our remote patient monitoring and principal care management and denies chest pain, symptoms of claudication and back pain have remained stable.  No change in weight.  Past Medical History:  Diagnosis Date  . Bilateral carotid bruits 02/12/2019  . Peripheral artery disease ([place]) 02/12/2019   Past Surgical History:  Procedure Laterality Date  . HIP SURGERY Right 10/05/2018   Family History  Problem Relation Age of Onset  . Tuberculosis Mother   . Heart disease Father     Social History   Tobacco Use  . Smoking status: Former Smoker    Packs/day: 0.25    Years: 10.00    Pack years: 2.50    Types: Cigarettes    Quit date: 04/19/1971    Years since quitting: 48.6  . Smokeless tobacco: Never Used  Substance Use Topics  . Alcohol use: No   Marital Status: Widowed ROS  Review of Systems  Constitution: Negative for weight gain.  Cardiovascular: Positive for claudication and dyspnea on exertion. Negative for leg swelling.  Musculoskeletal: Positive for arthritis and back pain.  Gastrointestinal: Negative for melena.  Neurological: Positive for paresthesias (bilateral legs). Negative for headaches.   Psychiatric/Behavioral: The patient is nervous/anxious.   All other systems reviewed and are negative.  Objective  Blood pressure 137/70, pulse 77, temperature (!) 96.3 F (35.7 C), temperature source Temporal, resp. rate 16, height 5\' 5"  (1.651 m), weight 227 lb (103 kg), SpO2 99 %.  Vitals with BMI 12/03/2019 12/03/2019 10/15/2019  Height - 5\' 5"  5' 5.5"  Weight - 227 lbs 225 lbs  BMI - [id number]  Systolic 0000000 0000000 [id number]  Diastolic 70 79 78  Pulse - 77 61     Physical Exam  Constitutional:  Moderately obese patient in no distress  Cardiovascular: Normal rate, regular rhythm, S1 normal, S2 normal and intact distal pulses. Exam reveals no gallop.  Murmur heard.  Harsh early systolic murmur is present with a grade of 2/6 at the upper right sternal border radiating to the neck. Pulses:      Carotid pulses are on the left side with bruit.      Femoral pulses are 2+ on the right side and 2+ on the left side.      Popliteal pulses are 1+ on the right side and 1+ on the left side.       Dorsalis pedis pulses are 2+ on the right side and 2+ on the left side.       Posterior tibial pulses are 0 on the right side and 0 on the left side.  No JVD or leg edema.  Pulmonary/Chest: Effort normal and breath sounds normal.  Abdominal: Soft. Bowel sounds are normal.  obese  Laboratory examination:   Recent Labs    03/09/19 1128 09/22/19 0935  NA 142 143  K 4.1 4.0  CL 106 106  CO2 26 29  GLUCOSE 91 92  BUN 17 16  CREATININE 1.30* 1.11*  CALCIUM 9.4 9.4  GFRNONAA 38* 47*  GFRAA 45* 54*   CrCl cannot be calculated (Patient's most recent lab result is older than the maximum 21 days allowed.).  CMP Latest Ref Rng & Units 09/22/2019 03/09/2019 09/08/2018  Glucose 70 - 99 mg/dL 92 91 99  BUN 8 - 23 mg/dL 16 17 15   Creatinine 0.44 - 1.00 mg/dL 1.11(H) 1.30(H) 1.23(H)  Sodium 135 - 145 mmol/L 143 142 143  Potassium 3.5 - 5.1 mmol/L 4.0 4.1 3.8  Chloride 98 - 111 mmol/L 106 106 106  CO2 22 -  32 mmol/L 29 26 27   Calcium 8.9 - 10.3 mg/dL 9.4 9.4 9.4  Total Protein 6.5 - 8.1 g/dL 7.5 7.7 7.9  Total Bilirubin 0.3 - 1.2 mg/dL 0.5 0.4 0.4  Alkaline Phos 38 - 126 U/L 63 75 69  AST 15 - 41 U/L 19 22 20   ALT 0 - 44 U/L 14 18 13    CBC Latest Ref Rng & Units 09/22/2019 03/09/2019 09/08/2018  WBC 4.0 - 10.5 K/uL 6.3 5.9 5.2  Hemoglobin 12.0 - 15.0 g/dL 11.0(L) 11.1(L) 12.1  Hematocrit 36.0 - 46.0 % 34.8(L) 35.0(L) 37.9  Platelets 150 - 400 K/uL 229 261 262    External labs:   Lipid Panel 10/15/2019: Cholesterol, total 187.000 10/15/2019 Triglycerides 76.000 10/15/2019 HDL 79.000 10/15/2019 LDL-C 121.000 M 08/22/2016  Glucose Random 92.000 09/22/2019  TSH 0.590 10/15/2019  LP+Non-HDL Cholesterol 02/01/2019 10:33 AM [facility] Component Name Value Ref Range Cholesterol, Total 189 100 - 199 mg/dL Triglycerides 80 0 - 149 mg/dL HDL 73 >39 mg/dL VLDL Cholesterol Cal 16 5 - 40 mg/dL LDL 100 (H) 0 - 99 mg/dL  A1C 6.000 % 08/22/2016   Medications and allergies   Allergies  Allergen Reactions  . Amlodipine Besylate Other (See Comments)    Nightmares and nerve pain  . Atorvastatin Other (See Comments)    nightmare nightmare  . Isosorbide Dinitrate Other (See Comments)    Nerve pain, lower back pain, feels out of it  . Rosuvastatin Other (See Comments)    Nightmare.  Hence takes it during day     Current Outpatient Medications  Medication Instructions  . albuterol (PROVENTIL HFA;VENTOLIN HFA) 108 (90 Base) MCG/ACT inhaler 2 puffs, Inhalation, As needed  . aspirin 81 MG EC tablet Oral  . budesonide (PULMICORT) 0.25 mg, Nebulization, As needed  . Crestor 20 mg, Oral, Daily, Taking it in the afternoon  . ezetimibe (ZETIA) 10 mg, Oral, Daily  . hydrALAZINE (APRESOLINE) 50 mg, Oral, 2 times daily  . labetalol (NORMODYNE) 100 mg, Oral, 2 times daily  . latanoprost (XALATAN) 0.005 % ophthalmic solution 1 drop, Both Eyes, Daily at bedtime  . levothyroxine (SYNTHROID) 100 MCG tablet  Oral  . olmesartan-hydrochlorothiazide (BENICAR HCT) 40-12.5 MG tablet 1 tablet, Oral, Daily  . Propylene Glycol (SYSTANE COMPLETE OP) 1 drop, Ophthalmic, As needed  . SYMBICORT 160-4.5 MCG/ACT inhaler 2 puffs, Inhalation, Daily, Taking 1-2 daily    Radiology:  No results found.  Cardiac Studies:   Coronary stent in ? 2001 and restenosis and brachytherapy following year, no details available in EPIC.  Lower extremity arterial duplex 05/15/2018: Right mid SFA is occluded with distal reconstitution. No hemodynamically significant stenoses are identified in the left  lower extremity arterial system. This exam reveals normal perfusion of the lower extremity (ABI 1.00 bilaterally).  Carotid artery duplex 06/09/2019:  Stenosis in the right internal carotid artery (16-49%).  Stenosis in the left internal carotid artery (50-69%).  Antegrade right vertebral artery flow. Antegrade left vertebral artery  flow.  No significant change from 01/05/2019. Follow up in six months is  appropriate if clinically indicated.  Echocardiogram 08/03/2019: Left ventricle cavity is normal in size. Mild concentric hypertrophy of the left ventricle. Normal global wall motion. Normal LV systolic function with EF 61%. Doppler evidence of grade II (pseudonormal) diastolic dysfunction, elevated LAP.  Left atrial cavity is moderately dilated. Trileaflet aortic valve with mild aortic valve leaflet and noncoronary cusp calcification. Trace aortic stenosis. Moderate (Grade III) posteriorly directed mitral regurgitation. Mild to moderate tricuspid regurgitation. Estimated pulmonary artery systolic pressure is 32 mmHg.  Compared to previous study in 2019, LA/LV filling pressures are increased.  Renal artery duplex 10/27/2019:  No evidence of renal artery occlusive disease in either renal artery.  Renal length is within normal limits for both kidneys.  Normal abdominal aorta flow velocities noted.  EKG:  08/18/2019:  Normal sinus rhythm with rate of 62 bpm, borderline criteria for left atrial enlargement, normal axis.  Poor with progression, cannot exclude anteroseptal infarct old.  No evidence of ischemia.  Normal QT interval. No significant change from EKG 02/12/2019.  Assessment     ICD-10-CM   1. Resistant hypertension  I10 labetalol (NORMODYNE) 200 MG tablet  2. Hypercholesteremia  E78.00 ezetimibe (ZETIA) 10 MG tablet    Lipid Panel With LDL/HDL Ratio    Lipid Panel With LDL/HDL Ratio  3. Stage 3a chronic kidney disease  N18.31      Meds ordered this encounter  Medications  . labetalol (NORMODYNE) 200 MG tablet    Sig: Take 0.5 tablets (100 mg total) by mouth 2 (two) times daily.    Dispense:  60 tablet    Refill:  3    Discontinue Metoprolol  . ezetimibe (ZETIA) 10 MG tablet    Sig: Take 1 tablet (10 mg total) by mouth daily.    Dispense:  30 tablet    Refill:  2    Medications Discontinued During This Encounter  Medication Reason  . labetalol (NORMODYNE) 200 MG tablet     Recommendations:   Brenda Barry  is a 84 y.o. African-American female with known coronary artery disease with remote coronary stenting 2001 and history of brachytherapy due to restenosis, difficult to control hypertension, asymptomatic bilateral carotid artery disease, PAD with both neurogenic claudication and vascular claudication, stage III chronic kidney disease due to hypertension, hyperlipidemia, presents here for 6 weekOV. She has had difficulty in BP control. Enrolled in Remote Patient Monitoring and Principal Care Management as patient is high risk for hospitalization and complications from underlying medical conditions.   Since changing to labetalol, blood pressure is now much improved.  She has been taking labetalol 200 mg in the evening as it makes her feel dizzy and tired, suspect she will need 100 mg twice daily, advised her to try this way.  She does have mild carotid disease, hyperlipidemia, stage III  chronic kidney disease and her lipids are not at goal.  I will add Zetia to her present medical regimen.  We will check lipids in 2 months.  Reviewed renal duplex, no evidence of renal artery stenosis. Patient has underlying CAD, PAD and also carotid disease and is extreme high risk individual for  cardiovascular events. OV in 6 months.   Adrian Prows, MD, [facility] 12/04/2019, 12:25 PM [facility] [place] Pager: [phone] Office: [phone]

## 2019-12-03 ENCOUNTER — Other Ambulatory Visit: Payer: Self-pay

## 2019-12-03 ENCOUNTER — Ambulatory Visit: Payer: Medicare Other | Admitting: Cardiology

## 2019-12-03 ENCOUNTER — Telehealth: Payer: Self-pay | Admitting: Pharmacist

## 2019-12-03 ENCOUNTER — Encounter: Payer: Self-pay | Admitting: Cardiology

## 2019-12-03 VITALS — BP 137/70 | HR 77 | Temp 96.3°F | Resp 16 | Ht 65.0 in | Wt 227.0 lb

## 2019-12-03 DIAGNOSIS — E7800 Pure hypercholesterolemia, unspecified: Secondary | ICD-10-CM

## 2019-12-03 DIAGNOSIS — I10 Essential (primary) hypertension: Secondary | ICD-10-CM

## 2019-12-03 DIAGNOSIS — N1831 Chronic kidney disease, stage 3a: Secondary | ICD-10-CM

## 2019-12-03 MED ORDER — LABETALOL HCL 200 MG PO TABS: 100.0000 mg | ORAL_TABLET | Freq: Two times a day (BID) | ORAL | 3 refills | Status: DC

## 2019-12-03 NOTE — Telephone Encounter (Signed)
Called to follow up regarding OV today w/ Dr. Einar Gip. Dr. Einar Gip would like to add Zetia for better lipid level management and ASCVD risk reduction. Called to discuss with patient. LVM for pt to call back.

## 2019-12-04 MED ORDER — EZETIMIBE 10 MG PO TABS: 10.0000 mg | ORAL_TABLET | Freq: Every day | ORAL | 2 refills | Status: DC

## 2019-12-10 ENCOUNTER — Ambulatory Visit: Payer: Medicare Other

## 2019-12-10 ENCOUNTER — Other Ambulatory Visit: Payer: Self-pay

## 2019-12-12 ENCOUNTER — Other Ambulatory Visit: Payer: Self-pay | Admitting: Cardiology

## 2019-12-12 DIAGNOSIS — I6523 Occlusion and stenosis of bilateral carotid arteries: Secondary | ICD-10-CM

## 2019-12-13 NOTE — Progress Notes (Signed)
Mild stenosis right and moderate left, continue monitoring.

## 2019-12-17 ENCOUNTER — Telehealth: Payer: Self-pay | Admitting: Pharmacist

## 2019-12-17 NOTE — Telephone Encounter (Signed)
Called patient for PCM f/u. Home BP readings have been stable and at goal. Few episodes of SBP <100. Pt reports to having occasional episode of feeling lightheaded. Denies any recent falls or syncope. Reviewed management of hypotension. Med list reviewed. Pt noted to be taking 200 mg Labetalol once in the afternoon. Symptoms of dizziness more prominent in the evening. Per OV note from 5/14, pt was instructed to reduce dose to 100 mg BID. Reinforced the new dose change. Pt verbalized understanding.   Pt also reports to have stopped taking her zetia after taking it for 2-3 doses. Pt reported that it worsened her neuropathy pain and made her feel more anxious and irritable. Wasn't able to sleep properly during the days that she took zetia. Symptoms improved since stopping it. Encouraged patient to continue improving her exercise tolerance and her diet. Pt may benefit from closer dietitian evaluation and support. Pt to reach out to her PCP during her next OV for referral. Reviewed basic heart healthy diet recommendations.

## 2019-12-24 ENCOUNTER — Other Ambulatory Visit: Payer: Self-pay

## 2019-12-24 ENCOUNTER — Ambulatory Visit (INDEPENDENT_AMBULATORY_CARE_PROVIDER_SITE_OTHER): Payer: Medicare Other | Admitting: Podiatry

## 2019-12-24 ENCOUNTER — Encounter: Payer: Self-pay | Admitting: Podiatry

## 2019-12-24 DIAGNOSIS — N183 Chronic kidney disease, stage 3 unspecified: Secondary | ICD-10-CM

## 2019-12-24 DIAGNOSIS — B351 Tinea unguium: Secondary | ICD-10-CM | POA: Diagnosis not present

## 2019-12-24 DIAGNOSIS — M79674 Pain in right toe(s): Secondary | ICD-10-CM

## 2019-12-24 DIAGNOSIS — M79675 Pain in left toe(s): Secondary | ICD-10-CM

## 2019-12-24 NOTE — Progress Notes (Signed)
This patient returns to my office for at risk foot care.  This patient requires this care by a professional since this patient will be at risk due to having chronic kidney disease  This patient is unable to cut nails herself since the patient cannot reach her nails.These nails are painful walking and wearing shoes.  This patient presents for at risk foot care today.  General Appearance  Alert, conversant and in no acute stress.  Vascular  Dorsalis pedis and posterior tibial  pulses are palpable  bilaterally.  Capillary return is within normal limits  bilaterally. Temperature is within normal limits  bilaterally.  Neurologic  Senn-Weinstein monofilament wire test within normal limits  bilaterally. Muscle power within normal limits bilaterally.  Nails Thick disfigured discolored nails with subungual debris  from hallux to fifth toes bilaterally. No evidence of bacterial infection or drainage bilaterally.  Orthopedic  No limitations of motion  feet .  No crepitus or effusions noted.  No bony pathology or digital deformities noted. HAV  B/L.  Skin  normotropic skin with no porokeratosis noted bilaterally.  No signs of infections or ulcers noted.     Onychomycosis  Pain in right toes  Pain in left toes  Consent was obtained for treatment procedures.   Mechanical debridement of nails 1-5  bilaterally performed with a nail nipper.  Filed with dremel without incident.    Return office visit   3 months                   Told patient to return for periodic foot care and evaluation due to potential at risk complications.   Geffrey Michaelsen DPM  

## 2020-02-02 LAB — LIPID PANEL WITH LDL/HDL RATIO
Cholesterol, Total: 188 mg/dL (ref 100–199)
HDL: 82 mg/dL (ref >39)
LDL Chol Calc (NIH): 95 mg/dL (ref 0–99)
LDL/HDL Ratio: 1.2 ratio (ref 0.0–3.2)
Triglycerides: 61 mg/dL (ref 0–149)
VLDL Cholesterol Cal: 11 mg/dL (ref 5–40)

## 2020-03-01 ENCOUNTER — Other Ambulatory Visit: Payer: Self-pay | Admitting: Cardiology

## 2020-03-01 DIAGNOSIS — E7800 Pure hypercholesterolemia, unspecified: Secondary | ICD-10-CM

## 2020-03-22 ENCOUNTER — Inpatient Hospital Stay: Payer: Medicare Other | Attending: Hematology

## 2020-03-22 ENCOUNTER — Other Ambulatory Visit: Payer: Self-pay

## 2020-03-22 DIAGNOSIS — D472 Monoclonal gammopathy: Secondary | ICD-10-CM | POA: Diagnosis not present

## 2020-03-22 DIAGNOSIS — D649 Anemia, unspecified: Secondary | ICD-10-CM | POA: Diagnosis not present

## 2020-03-22 DIAGNOSIS — Z87891 Personal history of nicotine dependence: Secondary | ICD-10-CM | POA: Insufficient documentation

## 2020-03-22 DIAGNOSIS — E538 Deficiency of other specified B group vitamins: Secondary | ICD-10-CM | POA: Insufficient documentation

## 2020-03-22 DIAGNOSIS — I10 Essential (primary) hypertension: Secondary | ICD-10-CM | POA: Insufficient documentation

## 2020-03-22 DIAGNOSIS — D7589 Other specified diseases of blood and blood-forming organs: Secondary | ICD-10-CM

## 2020-03-22 LAB — CMP (CANCER CENTER ONLY)
ALT: 14 U/L (ref 0–44)
AST: 25 U/L (ref 15–41)
Albumin: 3.6 g/dL (ref 3.5–5.0)
Alkaline Phosphatase: 60 U/L (ref 38–126)
Anion gap: 9 (ref 5–15)
BUN: 19 mg/dL (ref 8–23)
CO2: 25 mmol/L (ref 22–32)
Calcium: 10.3 mg/dL (ref 8.9–10.3)
Chloride: 106 mmol/L (ref 98–111)
Creatinine: 1.22 mg/dL — ABNORMAL HIGH (ref 0.44–1.00)
GFR, Est AFR Am: 48 mL/min — ABNORMAL LOW (ref >60)
GFR, Estimated: 41 mL/min — ABNORMAL LOW (ref >60)
Glucose, Bld: 95 mg/dL (ref 70–99)
Potassium: 3.9 mmol/L (ref 3.5–5.1)
Sodium: 140 mmol/L (ref 135–145)
Total Bilirubin: 0.5 mg/dL (ref 0.3–1.2)
Total Protein: 7.3 g/dL (ref 6.5–8.1)

## 2020-03-22 LAB — CBC WITH DIFFERENTIAL/PLATELET
Abs Immature Granulocytes: 0.01 10*3/uL (ref 0.00–0.07)
Basophils Absolute: 0 10*3/uL (ref 0.0–0.1)
Basophils Relative: 1 %
Eosinophils Absolute: 0.2 10*3/uL (ref 0.0–0.5)
Eosinophils Relative: 4 %
HCT: 33.5 % — ABNORMAL LOW (ref 36.0–46.0)
Hemoglobin: 10.8 g/dL — ABNORMAL LOW (ref 12.0–15.0)
Immature Granulocytes: 0 %
Lymphocytes Relative: 43 %
Lymphs Abs: 2.8 10*3/uL (ref 0.7–4.0)
MCH: 29.6 pg (ref 26.0–34.0)
MCHC: 32.2 g/dL (ref 30.0–36.0)
MCV: 91.8 fL (ref 80.0–100.0)
Monocytes Absolute: 0.5 10*3/uL (ref 0.1–1.0)
Monocytes Relative: 8 %
Neutro Abs: 2.8 10*3/uL (ref 1.7–7.7)
Neutrophils Relative %: 44 %
Platelets: 252 10*3/uL (ref 150–400)
RBC: 3.65 MIL/uL — ABNORMAL LOW (ref 3.87–5.11)
RDW: 13.1 % (ref 11.5–15.5)
WBC: 6.4 10*3/uL (ref 4.0–10.5)
nRBC: 0 % (ref 0.0–0.2)

## 2020-03-22 LAB — VITAMIN B12: Vitamin B-12: 122 pg/mL — ABNORMAL LOW (ref 180–914)

## 2020-03-23 LAB — FOLATE RBC
Folate, Hemolysate: 257 ng/mL
Folate, RBC: 772 ng/mL (ref >498)
Hematocrit: 33.3 % — ABNORMAL LOW (ref 34.0–46.6)

## 2020-03-23 LAB — KAPPA/LAMBDA LIGHT CHAINS
Kappa free light chain: 41 mg/L — ABNORMAL HIGH (ref 3.3–19.4)
Kappa, lambda light chain ratio: 4.02 — ABNORMAL HIGH (ref 0.26–1.65)
Lambda free light chains: 10.2 mg/L (ref 5.7–26.3)

## 2020-03-24 LAB — MULTIPLE MYELOMA PANEL, SERUM
Albumin SerPl Elph-Mcnc: 3.3 g/dL (ref 2.9–4.4)
Albumin/Glob SerPl: 1 (ref 0.7–1.7)
Alpha 1: 0.3 g/dL (ref 0.0–0.4)
Alpha2 Glob SerPl Elph-Mcnc: 0.8 g/dL (ref 0.4–1.0)
B-Globulin SerPl Elph-Mcnc: 1 g/dL (ref 0.7–1.3)
Gamma Glob SerPl Elph-Mcnc: 1.4 g/dL (ref 0.4–1.8)
Globulin, Total: 3.5 g/dL (ref 2.2–3.9)
IgA: 176 mg/dL (ref 64–422)
IgG (Immunoglobin G), Serum: 1425 mg/dL (ref 586–1602)
IgM (Immunoglobulin M), Srm: 43 mg/dL (ref 26–217)
M Protein SerPl Elph-Mcnc: 0.9 g/dL — ABNORMAL HIGH
Total Protein ELP: 6.8 g/dL (ref 6.0–8.5)

## 2020-03-28 NOTE — Progress Notes (Signed)
HEMATOLOGY/ONCOLOGY CONSULTATION NOTE  Date of Service: 03/29/2020  Patient Care Team: Chesley Noon, MD as PCP - General (Family Medicine)  CHIEF COMPLAINTS/PURPOSE OF CONSULTATION:  Monoclonal Paraproteinemia   HISTORY OF PRESENTING ILLNESS:  Brenda Barry is a wonderful 84 y.o. female who has been referred to [place] by Dr. Anastasia Pall for evaluation and management of Monoclonal Paraproteinemia. The pt reports that she is doing well overall.   The pt reports that she has had sciatica in the last year with referred pain down her leg, caused by a pinched nerve at L4/5.  The pt is intending to see Dr. Leonel Ramsay in neurosurgery soon. The pt's lower back pain has been evaluated with an MRI most recently on 04/24/18. The pt is also being evaluated by cardiology.  The pt denies any neck pain whatsoever and denies any new bone pains.   The pt notes that her kidney functions have been stable recently and has been pursuing care with Dr. Erling Cruz at [facility]. The pt notes that her blood pressure has been high, and is elevated more when she sees physicians. She has taken HCTZ, Toprol-XL, and Hydralazine. The pt notes that her thyroid medication has recently been adjusted.   Of note prior to the patient's presentation, the pt had a Bone Survey on 01/03/17 which revealed No definite lytic lesions within the axial skeleton. 2. Mixed lytic and blastic lesion posteriorly at C7. CT or MRI could be used for further evaluation of the cervical spine. 3. 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 No discrete lytic lesions are present.   Subsequently the pt then had a CT Cervical Spine on 02/21/17 which revealed CT scan does not show a convincing abnormality within the C7 vertebral body. However, within the posterior C3 vertebral body, there is a well-circumscribed lucent area measuring 8 x 4 x 5 mm which, though not specific or  conclusive, could represent a focus of myeloma.  Most recent lab results (04/21/18) of CMP is as follows: all values are WNL except for Glucose at 104, Creatinine at 1.07, GFR at 57, BUN at 10, Sodium at 145, Phosphorous at 2.2. 04/21/18 SPEP revealed all values WNL except for M-spike at 0.7, with IgG monoclonal protein with kappa light chain specificity.   On review of systems, pt reports sciatica, stable energy levels, stable lower back pain, and denies any neck pain, new bone pains, abdominal pains, chest wall pain, and any other symptoms.   On PMHx the pt reports Osteoporosis, Hypothyroidism, Hyperlipidemia, CAD with 5p stent place in late 90s, benign lumpectomy of right breast, HTN, IgG monoclonal gammopathy, CKD stage III. On Social Hx the pt reports that she quit smoking cigarettes about 50 years ago.  Interval History:  Brenda Barry returns today for management and evaluation of her MGUS. The patient's last visit with [place] was on 09/28/2019. The pt reports that she is doing well overall.  The pt reports that she has been taking Vitamin B12 to assist with right lower extremity numbness and nerve pain. She has been taking PO B12 every other day for about a month. Pt incorporates meat in her diet. She denies any new fatigue or bone pain. Pt is eating and sleeping well.   Lab results (03/22/20) of CBC w/diff and CMP is as follows: all values are WNL except for RBC at 3.65, Hgb at 10.8, HCT at 33.5, Creatinine at 1.22, GFR Est Af Am at 48.  03/22/2020 Vitamin B12 at 122 03/22/2020 Folate Hemolysate at 257.0, HCT at 33.3, Folate RBC at 772 03/22/2020 MMP is as follows: all values are WNL except for M Protein at 0.9 03/22/2020 K/L light chains is as follows: Kappa free light chain at 41.0, Lamda free light chain at 10.2, K/L light chain ratio at 4.02  On review of systems, pt reports numbness/tingling in RLE and denies mouth sores, memory issues, new fatigue, new bone pain, new leg swelling,  abdominal pain and any other symptoms.    MEDICAL HISTORY:  HTN HLD Anxiety Sciatica  SURGICAL HISTORY: Past Surgical History:  Procedure Laterality Date  . HIP SURGERY Right 10/05/2018    SOCIAL HISTORY: Social History   Socioeconomic History  . Marital status: Widowed    Spouse name: Not on file  . Number of children: 3  . Years of education: Not on file  . Highest education level: Not on file  Occupational History  . Not on file  Tobacco Use  . Smoking status: Former Smoker    Packs/day: 0.25    Years: 10.00    Pack years: 2.50    Types: Cigarettes    Quit date: 04/19/1971    Years since quitting: 48.9  . Smokeless tobacco: Never Used  Vaping Use  . Vaping Use: Never used  Substance and Sexual Activity  . Alcohol use: No  . Drug use: No  . Sexual activity: Not on file  Other Topics Concern  . Not on file  Social History Narrative  . Not on file   Social Determinants of Health   Financial Resource Strain:   . Difficulty of Paying Living Expenses: Not on file  Food Insecurity:   . Worried About Charity fundraiser in the Last Year: Not on file  . Ran Out of Food in the Last Year: Not on file  Transportation Needs:   . Lack of Transportation (Medical): Not on file  . Lack of Transportation (Non-Medical): Not on file  Physical Activity:   . Days of Exercise per Week: Not on file  . Minutes of Exercise per Session: Not on file  Stress:   . Feeling of Stress : Not on file  Social Connections:   . Frequency of Communication with Friends and Family: Not on file  . Frequency of Social Gatherings with Friends and Family: Not on file  . Attends Religious Services: Not on file  . Active Member of Clubs or Organizations: Not on file  . Attends Archivist Meetings: Not on file  . Marital Status: Not on file  Intimate Partner Violence:   . Fear of Current or Ex-Partner: Not on file  . Emotionally Abused: Not on file  . Physically Abused: Not on file   . Sexually Abused: Not on file    FAMILY HISTORY: Family History  Problem Relation Age of Onset  . Tuberculosis Mother   . Heart disease Father     ALLERGIES:  is allergic to amlodipine besylate, atorvastatin, isosorbide dinitrate, and rosuvastatin.  MEDICATIONS:  Current Outpatient Medications  Medication Sig Dispense Refill  . albuterol (PROVENTIL HFA;VENTOLIN HFA) 108 (90 Base) MCG/ACT inhaler Inhale 2 puffs into the lungs as needed.    Marland Kitchen aspirin 81 MG EC tablet Take by mouth.    . budesonide (PULMICORT) 0.25 MG/2ML nebulizer solution Take 0.25 mg by nebulization as needed.    . CRESTOR 20 MG tablet Take 20 mg by mouth daily. Taking it in the afternoon    .  Cyanocobalamin (B-12) 1000 MCG TABS Take 1 tablet by mouth daily.    Marland Kitchen ezetimibe (ZETIA) 10 MG tablet TAKE 1 TABLET(10 MG) BY MOUTH DAILY 30 tablet 2  . hydrALAZINE (APRESOLINE) 50 MG tablet Take 50 mg by mouth 2 (two) times daily.     Marland Kitchen labetalol (NORMODYNE) 200 MG tablet Take 0.5 tablets (100 mg total) by mouth 2 (two) times daily. 60 tablet 3  . latanoprost (XALATAN) 0.005 % ophthalmic solution Place 1 drop into both eyes at bedtime.    Marland Kitchen levothyroxine (SYNTHROID) 100 MCG tablet Take by mouth.    . olmesartan-hydrochlorothiazide (BENICAR HCT) 40-12.5 MG tablet Take 1 tablet by mouth daily. (Patient taking differently: Take 1 tablet by mouth daily. Taking in the morning) 90 tablet 2  . Propylene Glycol (SYSTANE COMPLETE OP) Apply 1 drop to eye as needed.    . SYMBICORT 160-4.5 MCG/ACT inhaler Inhale 2 puffs into the lungs daily. Taking 1-2 daily     No current facility-administered medications for this visit.    REVIEW OF SYSTEMS:   A 10+ POINT REVIEW OF SYSTEMS WAS OBTAINED including neurology, dermatology, psychiatry, cardiac, respiratory, lymph, extremities, GI, GU, Musculoskeletal, constitutional, breasts, reproductive, HEENT.  All pertinent positives are noted in the HPI.  All others are negative.   PHYSICAL  EXAMINATION:  . Vitals:   03/29/20 0942  BP: (!) 184/71  Pulse: 77  Resp: 18  Temp: (!) 96.8 F (36 C)  SpO2: 100%   Filed Weights   03/29/20 0942  Weight: 221 lb 12.8 oz (100.6 kg)   .Body mass index is 36.91 kg/m.  Exam was given in a chair   GENERAL:alert, in no acute distress and comfortable SKIN: no acute rashes, no significant lesions EYES: conjunctiva are pink and non-injected, sclera anicteric OROPHARYNX: MMM, no exudates, no oropharyngeal erythema or ulceration NECK: supple, no JVD LYMPH:  no palpable lymphadenopathy in the cervical, axillary or inguinal regions LUNGS: clear to auscultation b/l with normal respiratory effort HEART: regular rate & rhythm ABDOMEN:  normoactive bowel sounds , non tender, not distended. No palpable hepatosplenomegaly.  Extremity: no pedal edema PSYCH: alert & oriented x 3 with fluent speech NEURO: no focal motor/sensory deficits  LABORATORY DATA:  I have reviewed the data as listed  . CBC Latest Ref Rng & Units 03/22/2020 03/22/2020 09/22/2019  WBC 4.0 - 10.5 K/uL 6.4 - 6.3  Hemoglobin 12.0 - 15.0 g/dL 10.8(L) - 11.0(L)  Hematocrit 34.0 - 46.6 % 33.5(L) 33.3(L) 34.8(L)  Platelets 150 - 400 K/uL 252 - 229    . CMP Latest Ref Rng & Units 03/22/2020 09/22/2019 03/09/2019  Glucose 70 - 99 mg/dL 95 92 91  BUN 8 - 23 mg/dL _0 Creatinine 0.44 - 1.00 mg/dL 1.22(H) 1.11(H) 1.30(H)  Sodium 135 - 145 mmol/L 140 143 142  Potassium 3.5 - 5.1 mmol/L 3.9 4.0 4.1  Chloride 98 - 111 mmol/L 106 106 106  CO2 22 - 32 mmol/L _1 Calcium 8.9 - 10.3 mg/dL 10.3 9.4 9.4  Total Protein 6.5 - 8.1 g/dL 7.3 7.5 7.7  Total Bilirubin 0.3 - 1.2 mg/dL 0.5 0.5 0.4  Alkaline Phos 38 - 126 U/L 60 63 75  AST 15 - 41 U/L _2 ALT 0 - 44 U/L _3 03/16/2019 MMP       04/17/18 CBC:    04/21/18 CMP:    04/21/18 SPEP:   04/21/18 Immunofixation:    RADIOGRAPHIC STUDIES:  I have personally reviewed the radiological images as  listed and agreed with the findings in the report. No results found.  ASSESSMENT & PLAN:   84 y.o. female with  1. MGUS- Monoclonal Gammopathy or undetermined significance. 01/03/17 Bone Survey revealed No definite lytic lesions within the axial skeleton. 2. Mixed lytic and blastic lesion posteriorly at C7. CT or MRI could be used for further evaluation of the cervical spine. 3. 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 No discrete lytic lesions are present.  02/21/17 CT Cervical Spine revealed CT scan does not show a convincing abnormality within the C7 vertebral body. However, within the posterior C3 vertebral body, there is a well-circumscribed lucent area measuring 8 x 4 x 5 mm which, though not specific or conclusive, could represent a focus of myeloma.  04/24/18 MRI Lumbar Spine revealed degenerative disc disease, multilevel disc bulges and disc osteophytes and advised that the pt speak with her PCP regarding 2.9cm cystic lesion in right adnexa  Labs from initial presentation from 04/21/18: M Protein at 0.7g  PLAN: -Discussed pt labwork, 03/22/20; mild anemia, no hypercalcemia, stable kidney function, M Protein is unchanged, significantly B12 deficient, K/L light chain ratio is slowly increasing. -The pt shows no overt clinical or lab transformation of her MGUS at this time. -Will continue to monitor pt with labs and clinical every 6 months for progression to Multiple Myeloma. -Advised pt that fluctuations in thyroid hormone can cause some anemia.  -Advised pt that she could have antibodies that disturb the absorption of B12 and other nutrients in the gut.  -Advised pt that if oral B12 replacement does not improve levels we could begin monthy Vitamin B12 injections. Goal B12 is >300, <1000. -Will get labs next time to r/o pernicious anemia. -Recommend pt begin 2000 mcg SL Vitamin B12 and one tablet B-complex daily. -Continue  Synthroid as prescribed.  -Recommend pt f/u with PCP for labwork in 3 months -Will see back in 6 months, with labs 1 week prior   2. HTN -chronically uncontrolled . Element of white coat hypertension. Patient notes BP much better at home. No CP/SOB or headaches. Rpt BP in clinic 170/84 PLAN -continue compliance with anti HTN -continue f/u with PCP to optimize BP control   FOLLOW UP: RTC with Dr Irene Limbo with labs in 6 months. Plz schedule labs 1 week prior to clinic visit   The total time spent in the appt was 20 minutes and more than 50% was on counseling and direct patient cares.  All of the patient's questions were answered with apparent satisfaction. The patient knows to call the clinic with any problems, questions or concerns.    Sullivan Lone MD [place] AAHIVMS [facility] Hematology/Oncology Physician [facility]  (Office):       [phone] (Work cell):  [phone] (Fax):           [phone]  03/29/2020 10:04 AM  I, Yevette Edwards, am acting as a scribe for Dr. Sullivan Lone.   .I have reviewed the above documentation for accuracy and completeness, and I agree with the above. Brunetta Genera MD

## 2020-03-29 ENCOUNTER — Inpatient Hospital Stay (HOSPITAL_BASED_OUTPATIENT_CLINIC_OR_DEPARTMENT_OTHER): Payer: Medicare Other | Admitting: Hematology

## 2020-03-29 ENCOUNTER — Other Ambulatory Visit: Payer: Self-pay

## 2020-03-29 ENCOUNTER — Telehealth: Payer: Self-pay | Admitting: Hematology

## 2020-03-29 VITALS — BP 184/71 | HR 77 | Temp 96.8°F | Resp 18 | Ht 65.0 in | Wt 221.8 lb

## 2020-03-29 DIAGNOSIS — E538 Deficiency of other specified B group vitamins: Secondary | ICD-10-CM

## 2020-03-29 DIAGNOSIS — I6523 Occlusion and stenosis of bilateral carotid arteries: Secondary | ICD-10-CM | POA: Diagnosis not present

## 2020-03-29 DIAGNOSIS — D472 Monoclonal gammopathy: Secondary | ICD-10-CM

## 2020-03-29 NOTE — Telephone Encounter (Signed)
Scheduled appointments per 9/8 los. Patient is aware of appointments. Gave patient calendar print out.

## 2020-03-31 ENCOUNTER — Other Ambulatory Visit: Payer: Self-pay

## 2020-03-31 ENCOUNTER — Encounter: Payer: Self-pay | Admitting: Podiatry

## 2020-03-31 ENCOUNTER — Ambulatory Visit (INDEPENDENT_AMBULATORY_CARE_PROVIDER_SITE_OTHER): Payer: Medicare Other | Admitting: Podiatry

## 2020-03-31 DIAGNOSIS — B351 Tinea unguium: Secondary | ICD-10-CM | POA: Diagnosis not present

## 2020-03-31 DIAGNOSIS — M79674 Pain in right toe(s): Secondary | ICD-10-CM

## 2020-03-31 DIAGNOSIS — N183 Chronic kidney disease, stage 3 unspecified: Secondary | ICD-10-CM | POA: Diagnosis not present

## 2020-03-31 DIAGNOSIS — I739 Peripheral vascular disease, unspecified: Secondary | ICD-10-CM | POA: Diagnosis not present

## 2020-03-31 DIAGNOSIS — M79675 Pain in left toe(s): Secondary | ICD-10-CM

## 2020-03-31 NOTE — Progress Notes (Signed)
This patient returns to my office for at risk foot care.  This patient requires this care by a professional since this patient will be at risk due to having chronic kidney disease and PAD.Brenda Barry  This patient is unable to cut nails herself since the patient cannot reach her nails.These nails are painful walking and wearing shoes.  This patient presents for at risk foot care today.  General Appearance  Alert, conversant and in no acute stress.  Vascular  Dorsalis pedis and posterior tibial  pulses are palpable  bilaterally.  Capillary return is within normal limits  bilaterally. Temperature is within normal limits  bilaterally.  Neurologic  Senn-Weinstein monofilament wire test within normal limits  bilaterally. Muscle power within normal limits bilaterally.  Nails Thick disfigured discolored nails with subungual debris  from hallux to fifth toes bilaterally. No evidence of bacterial infection or drainage bilaterally.  Orthopedic  No limitations of motion  feet .  No crepitus or effusions noted.  No bony pathology or digital deformities noted.  HAV  B/L.  Skin  normotropic skin with no porokeratosis noted bilaterally.  No signs of infections or ulcers noted.   Asymptomatic HD 5th left foot.  Onychomycosis  Pain in right toes  Pain in left toes  Consent was obtained for treatment procedures.   Mechanical debridement of nails 1-5  bilaterally performed with a nail nipper.  Filed with dremel without incident.    Return office visit   3 months                   Told patient to return for periodic foot care and evaluation due to potential at risk complications.   Gardiner Barefoot DPM

## 2020-06-01 ENCOUNTER — Other Ambulatory Visit: Payer: Self-pay

## 2020-06-01 ENCOUNTER — Ambulatory Visit: Payer: Medicare Other

## 2020-06-01 DIAGNOSIS — I6523 Occlusion and stenosis of bilateral carotid arteries: Secondary | ICD-10-CM

## 2020-06-06 ENCOUNTER — Ambulatory Visit: Payer: Federal, State, Local not specified - PPO | Admitting: Cardiology

## 2020-06-07 ENCOUNTER — Ambulatory Visit: Payer: Federal, State, Local not specified - PPO | Admitting: Cardiology

## 2020-06-28 ENCOUNTER — Ambulatory Visit: Payer: Medicare Other | Admitting: Cardiology

## 2020-06-28 ENCOUNTER — Other Ambulatory Visit: Payer: Self-pay

## 2020-06-28 ENCOUNTER — Encounter: Payer: Self-pay | Admitting: Cardiology

## 2020-06-28 VITALS — BP 220/90 | HR 68 | Resp 16 | Ht 65.0 in | Wt 222.6 lb

## 2020-06-28 DIAGNOSIS — I6523 Occlusion and stenosis of bilateral carotid arteries: Secondary | ICD-10-CM

## 2020-06-28 DIAGNOSIS — I10 Essential (primary) hypertension: Secondary | ICD-10-CM

## 2020-06-28 DIAGNOSIS — I4891 Unspecified atrial fibrillation: Secondary | ICD-10-CM

## 2020-06-28 DIAGNOSIS — N1831 Chronic kidney disease, stage 3a: Secondary | ICD-10-CM

## 2020-06-28 DIAGNOSIS — I2510 Atherosclerotic heart disease of native coronary artery without angina pectoris: Secondary | ICD-10-CM

## 2020-06-28 MED ORDER — APIXABAN 5 MG PO TABS: 5.0000 mg | ORAL_TABLET | Freq: Two times a day (BID) | ORAL | 2 refills | Status: DC

## 2020-06-28 MED ORDER — METOPROLOL SUCCINATE ER 100 MG PO TB24: 100.0000 mg | ORAL_TABLET | Freq: Every evening | ORAL | 2 refills | Status: DC

## 2020-06-28 NOTE — Patient Instructions (Signed)

## 2020-06-28 NOTE — Progress Notes (Signed)
Primary Physician/Referring:  Chesley Noon, MD  Patient ID: Brenda Barry, female    DOB: 02/04/1936, 84 y.o.   MRN: [id number]  Chief Complaint  Patient presents with  . Hypertension  . Carotid    6 month f/u   HPI:    Brenda Barry  is a 84 y.o.  African-American female with known coronary artery disease with remote coronary stenting 2001 and history of brachytherapy due to restenosis, difficult to control hypertension, asymptomatic bilateral carotid artery disease, PAD with both neurogenic claudication and vascular claudication, stage III chronic kidney disease due to hypertension, hyperlipidemia. She has had difficulty in BP control.   She has reduced the dose of labetalol from 200 mg twice daily 200 mg once a day in the evening as it causes her to have marked fatigue and also reduced hydralazine 50 mg 3 times daily to twice daily.  She has no other specific symptoms today, states that she is feeling well.  She continues to have mild cramping in her legs with activity and mild chronic dyspnea but denies any symptoms to suggest TIA.  No PND or orthopnea.  Past Medical History:  Diagnosis Date  . Bilateral carotid bruits 02/12/2019  . Peripheral artery disease ([place]) 02/12/2019   Past Surgical History:  Procedure Laterality Date  . HIP SURGERY Right 10/05/2018   Family History  Problem Relation Age of Onset  . Tuberculosis Mother   . Heart disease Father     Social History   Tobacco Use  . Smoking status: Former Smoker    Packs/day: 0.25    Years: 10.00    Pack years: 2.50    Types: Cigarettes    Quit date: 04/19/1971    Years since quitting: 49.2  . Smokeless tobacco: Never Used  Substance Use Topics  . Alcohol use: No   Marital Status: Widowed ROS  Review of Systems  Constitutional: Negative for weight gain.  Cardiovascular: Positive for claudication and dyspnea on exertion. Negative for leg swelling.  Musculoskeletal: Positive for arthritis and back  pain.  Gastrointestinal: Negative for melena.  Neurological: Positive for paresthesias (bilateral legs). Negative for headaches.  Psychiatric/Behavioral: The patient is nervous/anxious.   All other systems reviewed and are negative.  Objective  Blood pressure (!) 220/90, pulse 68, resp. rate 16, height 5\' 5"  (1.651 m), weight 222 lb 9.6 oz (101 kg), SpO2 100 %.  Vitals with BMI 06/28/2020 06/28/2020 06/28/2020  Height - - 5\' 5"   Weight - - 222 lbs 10 oz  BMI - - 67.34  Systolic 193 790 240  Diastolic 90 96 90  Pulse - 68 72     Physical Exam Constitutional:      Comments: Moderately obese patient in no distress  Cardiovascular:     Rate and Rhythm: Normal rate and regular rhythm.     Pulses: Intact distal pulses.          Carotid pulses are on the left side with bruit.      Femoral pulses are 2+ on the right side and 2+ on the left side.      Popliteal pulses are 1+ on the right side and 1+ on the left side.       Dorsalis pedis pulses are 2+ on the right side and 2+ on the left side.       Posterior tibial pulses are 0 on the right side and 0 on the left side.     Heart sounds: S1 normal and  S2 normal. Murmur heard.  Harsh early systolic murmur is present with a grade of 2/6 at the upper right sternal border radiating to the neck.  No gallop.      Comments: No JVD or leg edema. Pulmonary:     Effort: Pulmonary effort is normal.     Breath sounds: Normal breath sounds.  Abdominal:     General: Bowel sounds are normal.     Palpations: Abdomen is soft.     Comments: obese    Laboratory examination:   Recent Labs    09/22/19 0935 03/22/20 0907  NA 143 140  K 4.0 3.9  CL 106 106  CO2 29 25  GLUCOSE 92 95  BUN 16 19  CREATININE 1.11* 1.22*  CALCIUM 9.4 10.3  GFRNONAA 47* 41*  GFRAA 54* 48*   CrCl cannot be calculated (Patient's most recent lab result is older than the maximum 21 days allowed.).  CMP Latest Ref Rng & Units 03/22/2020 09/22/2019 03/09/2019  Glucose 70 - 99  mg/dL 95 92 91  BUN 8 - 23 mg/dL 19 16 17   Creatinine 0.44 - 1.00 mg/dL 1.22(H) 1.11(H) 1.30(H)  Sodium 135 - 145 mmol/L 140 143 142  Potassium 3.5 - 5.1 mmol/L 3.9 4.0 4.1  Chloride 98 - 111 mmol/L 106 106 106  CO2 22 - 32 mmol/L 25 29 26   Calcium 8.9 - 10.3 mg/dL 10.3 9.4 9.4  Total Protein 6.5 - 8.1 g/dL 7.3 7.5 7.7  Total Bilirubin 0.3 - 1.2 mg/dL 0.5 0.5 0.4  Alkaline Phos 38 - 126 U/L 60 63 75  AST 15 - 41 U/L 25 19 22   ALT 0 - 44 U/L 14 14 18    CBC Latest Ref Rng & Units 03/22/2020 03/22/2020 09/22/2019  WBC 4.0 - 10.5 K/uL 6.4 - 6.3  Hemoglobin 12.0 - 15.0 g/dL 10.8(L) - 11.0(L)  Hematocrit 34.0 - 46.6 % 33.5(L) 33.3(L) 34.8(L)  Platelets 150 - 400 K/uL 252 - 229    External labs:   Lipid Panel 10/15/2019: Cholesterol, total 187.000 10/15/2019 Triglycerides 76.000 10/15/2019 HDL 79.000 10/15/2019 LDL-C 121.000 M 08/22/2016  Glucose Random 92.000 09/22/2019  TSH 0.590 10/15/2019  LP+Non-HDL Cholesterol 02/01/2019 10:33 AM [facility] Component Name Value Ref Range Cholesterol, Total 189 100 - 199 mg/dL Triglycerides 80 0 - 149 mg/dL HDL 73 >39 mg/dL VLDL Cholesterol Cal 16 5 - 40 mg/dL LDL 100 (H) 0 - 99 mg/dL  A1C 6.000 % 08/22/2016   Medications and allergies   Allergies  Allergen Reactions  . Amlodipine Besylate Other (See Comments)    Nightmares and nerve pain Nightmares and nerve pain  . Atorvastatin Other (See Comments)    nightmare nightmare  . Isosorbide Dinitrate Other (See Comments)    Nerve pain, lower back pain, feels out of it Nerve pain, lower back pain, feels out of it  . Rosuvastatin Other (See Comments)    Nightmare.  Hence takes it during day    Current Outpatient Medications on File Prior to Visit  Medication Sig Dispense Refill  . albuterol (PROVENTIL HFA;VENTOLIN HFA) 108 (90 Base) MCG/ACT inhaler Inhale 2 puffs into the lungs as needed.    . B Complex-Biotin-FA (SUPER B-50 COMPLEX PO) Take 1 capsule by mouth.    . budesonide (PULMICORT)  0.25 MG/2ML nebulizer solution Take 0.25 mg by nebulization as needed.    . budesonide-formoterol (SYMBICORT) 160-4.5 MCG/ACT inhaler Inhale into the lungs.    . cyanocobalamin 2000 MCG tablet Take 2,000 mcg by mouth.     Marland Kitchen  hydrALAZINE (APRESOLINE) 50 MG tablet Take 50 mg by mouth 2 (two) times daily.     Marland Kitchen latanoprost (XALATAN) 0.005 % ophthalmic solution Place 1 drop into both eyes at bedtime.    Marland Kitchen levothyroxine (SYNTHROID) 100 MCG tablet Take by mouth.    . olmesartan-hydrochlorothiazide (BENICAR HCT) 40-12.5 MG tablet Take 1 tablet by mouth daily. (Patient taking differently: Take 1 tablet by mouth daily. Taking in the morning) 90 tablet 2  . rosuvastatin (CRESTOR) 20 MG tablet Take 1 tablet by mouth at bedtime.     No current facility-administered medications on file prior to visit.     Radiology:  No results found.  Cardiac Studies:   Coronary stent in ? 2001 and restenosis and brachytherapy following year, no details available in EPIC.  Lower extremity arterial duplex 05/15/2018: Right mid SFA is occluded with distal reconstitution. No hemodynamically significant stenoses are identified in the left lower extremity arterial system. This exam reveals normal perfusion of the lower extremity (ABI 1.00 bilaterally).  Echocardiogram 08/03/2019: Left ventricle cavity is normal in size. Mild concentric hypertrophy of the left ventricle. Normal global wall motion. Normal LV systolic function with EF 61%. Doppler evidence of grade II (pseudonormal) diastolic dysfunction, elevated LAP.  Left atrial cavity is moderately dilated. Trileaflet aortic valve with mild aortic valve leaflet and noncoronary cusp calcification. Trace aortic stenosis. Moderate (Grade III) posteriorly directed mitral regurgitation. Mild to moderate tricuspid regurgitation. Estimated pulmonary artery systolic pressure is 32 mmHg.  Compared to previous study in 2019, LA/LV filling pressures are increased.  Renal artery  duplex 10/27/2019:  No evidence of renal artery occlusive disease in either renal artery.  Renal length is within normal limits for both kidneys.  Normal abdominal aorta flow velocities noted.  Carotid artery duplex 06/01/2020: Stenosis in the right internal carotid artery (1-59%). Right external carotid stenosis of <50%.  Stenosis in the left internal carotid artery (50-69%). Stenosis in the left external carotid artery (<50%). Antegrade right vertebral artery flow. Antegrade left vertebral artery flow. Follow up in six months is appropriate if clinically indicated. No significant change from 12/10/2019.   EKG:   EKG 06/28/2020: Atrial fibrillation with controlled ventricular response at rate of 73 bpm, normal axis, septal infarct old.  Nonspecific T abnormality.    08/18/2019: Normal sinus rhythm with rate of 62 bpm, borderline criteria for left atrial enlargement, normal axis.  Poor with progression, cannot exclude anteroseptal infarct old.  No evidence of ischemia.  Normal QT interval. No significant change from EKG 02/12/2019.  Assessment     ICD-10-CM   1. New onset atrial fibrillation (HCC)  I48.91 apixaban (ELIQUIS) 5 MG TABS tablet    CBC    Basic metabolic panel    Basic metabolic panel    CBC    PCV ECHOCARDIOGRAM COMPLETE  2. Asymptomatic bilateral carotid artery stenosis  I65.23 PCV CAROTID DUPLEX (BILATERAL)  3. Coronary artery disease involving native coronary artery of native heart without angina pectoris  I25.10   4. Essential hypertension  I10 EKG 12-Lead    metoprolol succinate (TOPROL-XL) 100 MG 24 hr tablet  5. Stage 3a chronic kidney disease (HCC)  N18.31     CHA2DS2-VASc Score is 5.  Yearly risk of stroke: 7.2% (A, F, HTN, Vasc Dz).  Score of 1=0.6; 2=2.2; 3=3.2; 4=4.8; 5=7.2; 6=9.8; 7=>9.8) -(CHF; HTN; vasc disease DM,  Female = 1; Age <65 =0; 65-74 = 1,  >75 =2; stroke/embolism= 2).    Meds ordered this encounter  Medications  .  metoprolol succinate  (TOPROL-XL) 100 MG 24 hr tablet    Sig: Take 1 tablet (100 mg total) by mouth every evening. Take with or immediately following a meal.    Dispense:  30 tablet    Refill:  2  . apixaban (ELIQUIS) 5 MG TABS tablet    Sig: Take 1 tablet (5 mg total) by mouth 2 (two) times daily.    Dispense:  60 tablet    Refill:  2    Medications Discontinued During This Encounter  Medication Reason  . ezetimibe (ZETIA) 10 MG tablet Patient Preference  . Propylene Glycol (SYSTANE COMPLETE OP) Patient Preference  . labetalol (NORMODYNE) 200 MG tablet Side effect (s)  . aspirin 81 MG EC tablet Change in therapy    Recommendations:   Brenda Barry  is a 84 y.o. African-American female with known coronary artery disease with remote coronary stenting 2001 and history of brachytherapy due to restenosis, difficult to control hypertension, asymptomatic bilateral carotid artery disease, PAD with both neurogenic claudication and vascular claudication, stage III chronic kidney disease due to hypertension, hyperlipidemia, presents here for 6 weekOV. She has had difficulty in BP control. Enrolled in Remote Patient Monitoring and Principal Care Management as patient is high risk for hospitalization and complications from underlying medical conditions.   Since last office visit 6 months ago, she has reduced her labetalol from 200 twice daily to 1 her milligrams in the evening and hydralazine to once or twice a day due to fatigue.  Blood pressure is again markedly elevated.  Will discontinue labetalol and switch her back to metoprolol succinate 100 mg in the evening and see if she would tolerate this, previously has had similar experience.  Other option would be to switching her to Bystolic.  Today she is in atrial fibrillation.  I had a lengthy discussion with the patient regarding risks of atrial fibrillation, risk of bleeding and risk of stroke.  She is at extreme high risk for stroke.  I started her on Eliquis 5 mg p.o.  twice daily.  She will need BMP and CBC in 4 weeks to establish a baseline since starting anticoagulation.  We will continue to do carotid artery surveillance.  With regard to valvular heart disease, she has moderate mitral regurgitation and in view of new onset atrial fibrillation, which is probably related to uncontrolled hypertension, I will schedule her for repeat echocardiogram.  I would like to see her back in 4 weeks for follow-up.  If she is in atrial fibrillation, will consider direct-current cardioversion at that time.   Adrian Prows, MD, [facility] 06/28/2020, 5:05 PM Office: [phone] Pager: [phone]

## 2020-06-30 ENCOUNTER — Ambulatory Visit (INDEPENDENT_AMBULATORY_CARE_PROVIDER_SITE_OTHER): Payer: Medicare Other | Admitting: Podiatry

## 2020-06-30 ENCOUNTER — Other Ambulatory Visit: Payer: Self-pay

## 2020-06-30 ENCOUNTER — Encounter: Payer: Self-pay | Admitting: Podiatry

## 2020-06-30 DIAGNOSIS — D689 Coagulation defect, unspecified: Secondary | ICD-10-CM | POA: Insufficient documentation

## 2020-06-30 DIAGNOSIS — B351 Tinea unguium: Secondary | ICD-10-CM

## 2020-06-30 DIAGNOSIS — M79675 Pain in left toe(s): Secondary | ICD-10-CM

## 2020-06-30 DIAGNOSIS — M79674 Pain in right toe(s): Secondary | ICD-10-CM

## 2020-06-30 DIAGNOSIS — N183 Chronic kidney disease, stage 3 unspecified: Secondary | ICD-10-CM

## 2020-06-30 DIAGNOSIS — I739 Peripheral vascular disease, unspecified: Secondary | ICD-10-CM | POA: Diagnosis not present

## 2020-06-30 NOTE — Progress Notes (Signed)
This patient returns to my office for at risk foot care.  This patient requires this care by a professional since this patient will be at risk due to having chronic kidney disease and PAD.Marland Kitchen  This patient is unable to cut nails herself since the patient cannot reach her nails.These nails are painful walking and wearing shoes.  This patient presents for at risk foot care today.  General Appearance  Alert, conversant and in no acute stress.  Vascular  Dorsalis pedis and posterior tibial  pulses are palpable  bilaterally.  Capillary return is within normal limits  bilaterally. Temperature is within normal limits  bilaterally.  Neurologic  Senn-Weinstein monofilament wire test within normal limits  bilaterally. Muscle power within normal limits bilaterally.  Nails Thick disfigured discolored nails with subungual debris  from hallux to fifth toes bilaterally. No evidence of bacterial infection or drainage bilaterally.  Orthopedic  No limitations of motion  feet .  No crepitus or effusions noted.  No bony pathology or digital deformities noted.  HAV  B/L.  Skin  normotropic skin with no porokeratosis noted bilaterally.  No signs of infections or ulcers noted.   Asymptomatic HD 5th left foot.  Onychomycosis  Pain in right toes  Pain in left toes  Consent was obtained for treatment procedures.   Mechanical debridement of nails 1-5  bilaterally performed with a nail nipper.  Filed with dremel without incident.    Return office visit   3 months                   Told patient to return for periodic foot care and evaluation due to potential at risk complications.   Gardiner Barefoot DPM

## 2020-07-07 ENCOUNTER — Other Ambulatory Visit: Payer: Self-pay

## 2020-07-07 ENCOUNTER — Ambulatory Visit: Payer: Medicare Other

## 2020-07-07 DIAGNOSIS — I4891 Unspecified atrial fibrillation: Secondary | ICD-10-CM

## 2020-07-31 LAB — CBC
Hematocrit: 36.6 % (ref 34.0–46.6)
Hemoglobin: 11.6 g/dL (ref 11.1–15.9)
MCH: 28.2 pg (ref 26.6–33.0)
MCHC: 31.7 g/dL (ref 31.5–35.7)
MCV: 89 fL (ref 79–97)
Platelets: 307 10*3/uL (ref 150–450)
RBC: 4.12 x10E6/uL (ref 3.77–5.28)
RDW: 13.8 % (ref 11.7–15.4)
WBC: 6 10*3/uL (ref 3.4–10.8)

## 2020-08-01 LAB — BASIC METABOLIC PANEL
BUN/Creatinine Ratio: 13 (ref 12–28)
BUN: 16 mg/dL (ref 8–27)
CO2: 24 mmol/L (ref 20–29)
Calcium: 9.7 mg/dL (ref 8.7–10.3)
Chloride: 106 mmol/L (ref 96–106)
Creatinine, Ser: 1.25 mg/dL — ABNORMAL HIGH (ref 0.57–1.00)
GFR calc Af Amer: 46 mL/min/{1.73_m2} — ABNORMAL LOW (ref >59)
GFR calc non Af Amer: 40 mL/min/{1.73_m2} — ABNORMAL LOW (ref >59)
Glucose: 92 mg/dL (ref 65–99)
Potassium: 4.6 mmol/L (ref 3.5–5.2)
Sodium: 141 mmol/L (ref 134–144)

## 2020-08-03 ENCOUNTER — Ambulatory Visit: Payer: Federal, State, Local not specified - PPO | Admitting: Cardiology

## 2020-08-09 ENCOUNTER — Other Ambulatory Visit: Payer: Self-pay

## 2020-08-09 ENCOUNTER — Ambulatory Visit: Payer: Medicare Other | Admitting: Cardiology

## 2020-08-09 ENCOUNTER — Encounter: Payer: Self-pay | Admitting: Cardiology

## 2020-08-09 VITALS — BP 192/84 | HR 63 | Temp 97.8°F | Resp 16 | Ht 65.0 in | Wt 222.2 lb

## 2020-08-09 DIAGNOSIS — E7800 Pure hypercholesterolemia, unspecified: Secondary | ICD-10-CM

## 2020-08-09 DIAGNOSIS — I739 Peripheral vascular disease, unspecified: Secondary | ICD-10-CM

## 2020-08-09 DIAGNOSIS — I10 Essential (primary) hypertension: Secondary | ICD-10-CM

## 2020-08-09 DIAGNOSIS — I6523 Occlusion and stenosis of bilateral carotid arteries: Secondary | ICD-10-CM

## 2020-08-09 MED ORDER — ASPIRIN 81 MG PO CHEW: 81.0000 mg | CHEWABLE_TABLET | Freq: Every day | ORAL | Status: AC

## 2020-08-09 NOTE — Progress Notes (Signed)
Primary Physician/Referring:  Chesley Noon, MD  Patient ID: Brenda Barry, female    DOB: September 24, 1935, 85 y.o.   MRN: [id number]  Chief Complaint  Patient presents with  . Atrial Fibrillation  . Hypertension  . Follow-up    4 weeks   HPI:    Brenda Barry  is a 85 y.o.  African-American female with known coronary artery disease with remote coronary stenting 2001 and history of brachytherapy due to restenosis, difficult to control hypertension, asymptomatic bilateral carotid artery disease, PAD with both neurogenic claudication and vascular claudication, stage III chronic kidney disease due to hypertension, hyperlipidemia.    On her last office visit about 3 to 4 weeks ago, she was noted to be in atrial fibrillation by EKG, I started her on Eliquis and she now presents for follow-up of A. fib and hypertension.  She has no other specific symptoms today, states that she is feeling well.  Has mild chronic dyspnea but denies any symptoms to suggest TIA.  No PND or orthopnea.  Past Medical History:  Diagnosis Date  . Bilateral carotid bruits 02/12/2019  . Peripheral artery disease ([place]) 02/12/2019   Past Surgical History:  Procedure Laterality Date  . HIP SURGERY Right 10/05/2018   Family History  Problem Relation Age of Onset  . Tuberculosis Mother   . Heart disease Father     Social History   Tobacco Use  . Smoking status: Former Smoker    Packs/day: 0.25    Years: 10.00    Pack years: 2.50    Types: Cigarettes    Quit date: 04/19/1971    Years since quitting: 49.3  . Smokeless tobacco: Never Used  Substance Use Topics  . Alcohol use: No   Marital Status: Widowed ROS  Review of Systems  Constitutional: Negative for weight gain.  Cardiovascular: Positive for claudication and dyspnea on exertion. Negative for leg swelling.  Musculoskeletal: Positive for arthritis and back pain.  Gastrointestinal: Negative for melena.  Neurological: Positive for paresthesias  (bilateral legs). Negative for headaches.  Psychiatric/Behavioral: The patient is nervous/anxious.   All other systems reviewed and are negative.  Objective  Blood pressure (!) 192/84, pulse 63, temperature 97.8 F (36.6 C), temperature source Temporal, resp. rate 16, height $RemoveBe'5\' 5"'[id number]$  (1.651 m), weight 222 lb 3.2 oz (100.8 kg), SpO2 99 %.  Vitals with BMI 08/09/2020 08/09/2020 08/09/2020  Height - - $R'5\' 5"'iC$   Weight - - 222 lbs 3 oz  BMI - - 78.46  Systolic 962 952 841  Diastolic 84 84 53  Pulse 63 61 70     Physical Exam Constitutional:      Comments: Moderately obese patient in no distress  Cardiovascular:     Rate and Rhythm: Normal rate and regular rhythm. Frequent extrasystoles are present.    Pulses: Intact distal pulses.          Carotid pulses are on the left side with bruit.      Femoral pulses are 2+ on the right side and 2+ on the left side.      Popliteal pulses are 1+ on the right side and 1+ on the left side.       Dorsalis pedis pulses are 2+ on the right side and 2+ on the left side.       Posterior tibial pulses are 0 on the right side and 0 on the left side.     Heart sounds: S1 normal and S2 normal. Murmur heard.  Harsh early systolic murmur is present with a grade of 2/6 at the upper right sternal border radiating to the neck. No gallop.      Comments: No JVD or leg edema. Pulmonary:     Effort: Pulmonary effort is normal.     Breath sounds: Normal breath sounds.  Abdominal:     General: Bowel sounds are normal.     Palpations: Abdomen is soft.     Comments: obese    Laboratory examination:   Recent Labs    09/22/19 0935 03/22/20 0907 07/31/20 0917  NA 143 140 141  K 4.0 3.9 4.6  CL 106 106 106  CO2 $Re'29 25 24  'SnM$ GLUCOSE 92 95 92  BUN $Re'16 19 16  'LCd$ CREATININE 1.11* 1.22* 1.25*  CALCIUM 9.4 10.3 9.7  GFRNONAA 47* 41* 40*  GFRAA 54* 48* 46*   estimated creatinine clearance is 40.8 mL/min (A) (by C-G formula based on SCr of 1.25 mg/dL (H)).  CMP Latest Ref  Rng & Units 07/31/2020 03/22/2020 09/22/2019  Glucose 65 - 99 mg/dL 92 95 92  BUN 8 - 27 mg/dL $Remove'16 19 16  'dHioyhl$ Creatinine 0.57 - 1.00 mg/dL 1.25(H) 1.22(H) 1.11(H)  Sodium 134 - 144 mmol/L 141 140 143  Potassium 3.5 - 5.2 mmol/L 4.6 3.9 4.0  Chloride 96 - 106 mmol/L 106 106 106  CO2 20 - 29 mmol/L $RemoveB'24 25 29  'PzItAElp$ Calcium 8.7 - 10.3 mg/dL 9.7 10.3 9.4  Total Protein 6.5 - 8.1 g/dL - 7.3 7.5  Total Bilirubin 0.3 - 1.2 mg/dL - 0.5 0.5  Alkaline Phos 38 - 126 U/L - 60 63  AST 15 - 41 U/L - 25 19  ALT 0 - 44 U/L - 14 14   CBC Latest Ref Rng & Units 07/31/2020 03/22/2020 03/22/2020  WBC 3.4 - 10.8 x10E3/uL 6.0 6.4 -  Hemoglobin 11.1 - 15.9 g/dL 11.6 10.8(L) -  Hematocrit 34.0 - 46.6 % 36.6 33.5(L) 33.3(L)  Platelets 150 - 450 x10E3/uL 307 252 -    External labs:     Labs 02/18/2020:  Total cholesterol 177, triglycerides 73, HDL 78, LDL 85.  Non-HDL cholesterol 99.  BUN 16, creatinine 1.24, EGFR 47 mL.  TSH normal.  Lipid Panel 10/15/2019: Cholesterol, total 187.000 10/15/2019 Triglycerides 76.000 10/15/2019 HDL 79.000 10/15/2019 LDL-C 121.000 M 08/22/2016  Glucose Random 92.000 09/22/2019  TSH 0.590 10/15/2019 A1C 6.000 % 08/22/2016   Medications and allergies   Allergies  Allergen Reactions  . Amlodipine Besylate Other (See Comments)    Nightmares and nerve pain Nightmares and nerve pain  . Atorvastatin Other (See Comments)    nightmare nightmare  . Isosorbide Dinitrate Other (See Comments)    Nerve pain, lower back pain, feels out of it Nerve pain, lower back pain, feels out of it  . Rosuvastatin Other (See Comments)    Nightmare.  Hence takes it during day    Current Outpatient Medications on File Prior to Visit  Medication Sig Dispense Refill  . albuterol (PROVENTIL HFA;VENTOLIN HFA) 108 (90 Base) MCG/ACT inhaler Inhale 2 puffs into the lungs as needed.    . B Complex-Biotin-FA (SUPER B-50 COMPLEX PO) Take 1 capsule by mouth.    . budesonide-formoterol (SYMBICORT) 160-4.5 MCG/ACT  inhaler Inhale into the lungs.    . cyanocobalamin 2000 MCG tablet Take 2,000 mcg by mouth.     . hydrALAZINE (APRESOLINE) 50 MG tablet Take 50 mg by mouth 2 (two) times daily.     Marland Kitchen latanoprost (XALATAN) 0.005 % ophthalmic  solution Place 1 drop into both eyes at bedtime.    . metoprolol succinate (TOPROL-XL) 100 MG 24 hr tablet Take 1 tablet (100 mg total) by mouth every evening. Take with or immediately following a meal. 30 tablet 2  . olmesartan-hydrochlorothiazide (BENICAR HCT) 40-12.5 MG tablet Take 1 tablet by mouth daily. (Patient taking differently: Take 1 tablet by mouth daily. Taking in the morning) 90 tablet 2  . rosuvastatin (CRESTOR) 20 MG tablet Take 1 tablet by mouth at bedtime.    Marland Kitchen levothyroxine (SYNTHROID) 100 MCG tablet Take 100 mcg by mouth daily before breakfast.     No current facility-administered medications on file prior to visit.     Radiology:  No results found.  Cardiac Studies:   Coronary stent in ? 2001 and restenosis and brachytherapy following year, no details available in EPIC.  Lower extremity arterial duplex 05/15/2018: Right mid SFA is occluded with distal reconstitution. No hemodynamically significant stenoses are identified in the left lower extremity arterial system. This exam reveals normal perfusion of the lower extremity (ABI 1.00 bilaterally).  Renal artery duplex 10/27/2019:  No evidence of renal artery occlusive disease in either renal artery.  Renal length is within normal limits for both kidneys.  Normal abdominal aorta flow velocities noted.  Carotid artery duplex 06/01/2020: Stenosis in the right internal carotid artery (1-59%). Right external carotid stenosis of <50%.  Stenosis in the left internal carotid artery (50-69%). Stenosis in the left external carotid artery (<50%). Antegrade right vertebral artery flow. Antegrade left vertebral artery flow. Follow up in six months is appropriate if clinically indicated. No significant  change from 12/10/2019.   Echocardiogram 07/07/2020:  Normal LV systolic function with EF 55%. Left ventricle cavity is normal in size. Normal global wall motion. Doppler evidence of grade I (impaired) diastolic dysfunction. Calculated EF 55%. Left atrial cavity is mildly dilated. Native trileaflet aortic valve with no regurgitation. Mild aortic valve leaflet calcification. Mildly restricted aortic valve leaflets. No evidence of aortic valve stenosis. Structurally normal mitral valve.  Mild (Grade I) mitral regurgitation. Structurally normal tricuspid valve.  Mild tricuspid regurgitation. No evidence of pulmonary hypertension. Compared to the study done on 2/40/9735, grade 2 diastolic dysfunction not noted, moderate posterior MR is not appreciated. Tricuspid regurgitation previously was mild to moderate, now is mild with normal PA pressure, previously 32 mmHg.       EKG:    EKG 08/09/2020: Normal sinus rhythm at rate of 74 bpm, left atrial enlargement, normal axis.  No evidence of ischemia.  Frequent PACs.  Compared to 06/28/2020, no significant change, baseline artifact is not present. Previously reported A. Fib an error.   EKG 06/28/2020: Atrial fibrillation with controlled ventricular response at rate of 73 bpm, normal axis, septal infarct old.  Nonspecific T abnormality.    08/18/2019: Normal sinus rhythm with rate of 62 bpm, borderline criteria for left atrial enlargement, normal axis.  Poor with progression, cannot exclude anteroseptal infarct old.  No evidence of ischemia.  Normal QT interval. No significant change from EKG 02/12/2019.  Assessment     ICD-10-CM   1. Essential hypertension  I10 EKG 12-Lead  2. White coat syndrome with diagnosis of hypertension  I10   3. Asymptomatic bilateral carotid artery stenosis  I65.23 aspirin (ASPIRIN CHILDRENS) 81 MG chewable tablet  4. Peripheral artery disease (HCC)  I73.9 aspirin (ASPIRIN CHILDRENS) 81 MG chewable tablet  5.  Hypercholesteremia  E78.00      Meds ordered this encounter  Medications  . aspirin (  ASPIRIN CHILDRENS) 81 MG chewable tablet    Sig: Chew 1 tablet (81 mg total) by mouth daily.    Medications Discontinued During This Encounter  Medication Reason  . budesonide (PULMICORT) 0.25 MG/2ML nebulizer solution No longer needed (for PRN medications)  . apixaban (ELIQUIS) 5 MG TABS tablet Discontinued by provider    Recommendations:   Brenda Barry  is a 85 y.o. African-American female with known coronary artery disease with remote coronary stenting 2001 and history of brachytherapy due to restenosis, difficult to control hypertension, asymptomatic bilateral carotid artery disease, PAD with both neurogenic claudication and vascular claudication, stage III chronic kidney disease due to hypertension, hyperlipidemia. Due to difficulty in BP control. Remote Patient Monitoring and Principal Care Management as patient is high risk for hospitalization and complications from underlying medical conditions.   I had seen her on 06/28/2020 and thought that she was in atrial fibrillation. Reviewed EKG from 06/28/2020, there is baseline artifact and on further review EKG is very similar to today's EKG, there are frequent PACs.  She does not have atrial fibrillation.    And so I discontinued her Eliquis and restarted back aspirin for PAD and carotid disease.  I reviewed her home blood pressure recordings, under excellent control.  I truly believe that she has whitecoat hypertension.  We will continue to monitor this with RPM and PCM.  I would like to see her back in 6 months for follow-up of carotid disease and hypertension and hyperlipidemia. Echocardiogram does not reveal any progression of mitral valve disease and LVEF is preserved.  With regard to hypercholesterolemia, in view of peripheral artery disease and carotid disease, non-HDL goal <100 which she has achieved at 99.  Hence no change in statin  therapy.  Home blood pressure recordings  Home blood pressure monitoring under excellent control.    Adrian Prows, MD, [facility] 08/09/2020, [place] PM Office: [phone] Pager: [phone]

## 2020-09-14 ENCOUNTER — Other Ambulatory Visit: Payer: Self-pay | Admitting: Pharmacist

## 2020-09-14 NOTE — Progress Notes (Signed)
Pt called stating that she was not able to tolerate new start metoprolol due to complains of persistent nightmare and feeling extremely tired and fatigued since starting metoprolol. No difference in symptoms despite changing time of administration from evening to morning. Home BP readings continue to remain stable without metoprolol. Recent concerns of Afib was noted as an error.

## 2020-09-19 ENCOUNTER — Other Ambulatory Visit: Payer: Self-pay

## 2020-09-19 ENCOUNTER — Inpatient Hospital Stay: Payer: Medicare Other | Attending: Hematology

## 2020-09-19 DIAGNOSIS — D472 Monoclonal gammopathy: Secondary | ICD-10-CM | POA: Diagnosis not present

## 2020-09-19 DIAGNOSIS — E538 Deficiency of other specified B group vitamins: Secondary | ICD-10-CM | POA: Insufficient documentation

## 2020-09-19 DIAGNOSIS — I10 Essential (primary) hypertension: Secondary | ICD-10-CM | POA: Insufficient documentation

## 2020-09-19 DIAGNOSIS — Z87891 Personal history of nicotine dependence: Secondary | ICD-10-CM | POA: Diagnosis not present

## 2020-09-19 LAB — CBC WITH DIFFERENTIAL/PLATELET
Abs Immature Granulocytes: 0.01 10*3/uL (ref 0.00–0.07)
Basophils Absolute: 0 10*3/uL (ref 0.0–0.1)
Basophils Relative: 1 %
Eosinophils Absolute: 0.3 10*3/uL (ref 0.0–0.5)
Eosinophils Relative: 5 %
HCT: 35.5 % — ABNORMAL LOW (ref 36.0–46.0)
Hemoglobin: 11.4 g/dL — ABNORMAL LOW (ref 12.0–15.0)
Immature Granulocytes: 0 %
Lymphocytes Relative: 47 %
Lymphs Abs: 2.9 10*3/uL (ref 0.7–4.0)
MCH: 28.6 pg (ref 26.0–34.0)
MCHC: 32.1 g/dL (ref 30.0–36.0)
MCV: 89.2 fL (ref 80.0–100.0)
Monocytes Absolute: 0.5 10*3/uL (ref 0.1–1.0)
Monocytes Relative: 7 %
Neutro Abs: 2.5 10*3/uL (ref 1.7–7.7)
Neutrophils Relative %: 40 %
Platelets: 274 10*3/uL (ref 150–400)
RBC: 3.98 MIL/uL (ref 3.87–5.11)
RDW: 14.1 % (ref 11.5–15.5)
WBC: 6.3 10*3/uL (ref 4.0–10.5)
nRBC: 0 % (ref 0.0–0.2)

## 2020-09-19 LAB — CMP (CANCER CENTER ONLY)
ALT: 19 U/L (ref 0–44)
AST: 25 U/L (ref 15–41)
Albumin: 3.9 g/dL (ref 3.5–5.0)
Alkaline Phosphatase: 64 U/L (ref 38–126)
Anion gap: 6 (ref 5–15)
BUN: 16 mg/dL (ref 8–23)
CO2: 26 mmol/L (ref 22–32)
Calcium: 9.5 mg/dL (ref 8.9–10.3)
Chloride: 107 mmol/L (ref 98–111)
Creatinine: 1.26 mg/dL — ABNORMAL HIGH (ref 0.44–1.00)
GFR, Estimated: 43 mL/min — ABNORMAL LOW (ref >60)
Glucose, Bld: 94 mg/dL (ref 70–99)
Potassium: 4.5 mmol/L (ref 3.5–5.1)
Sodium: 139 mmol/L (ref 135–145)
Total Bilirubin: 0.4 mg/dL (ref 0.3–1.2)
Total Protein: 7.7 g/dL (ref 6.5–8.1)

## 2020-09-19 LAB — VITAMIN B12: Vitamin B-12: 1311 pg/mL — ABNORMAL HIGH (ref 180–914)

## 2020-09-20 LAB — ANTI-PARIETAL ANTIBODY: Parietal Cell Antibody-IgG: 57.1 Units — ABNORMAL HIGH (ref 0.0–20.0)

## 2020-09-20 LAB — KAPPA/LAMBDA LIGHT CHAINS
Kappa free light chain: 31.2 mg/L — ABNORMAL HIGH (ref 3.3–19.4)
Kappa, lambda light chain ratio: 3 — ABNORMAL HIGH (ref 0.26–1.65)
Lambda free light chains: 10.4 mg/L (ref 5.7–26.3)

## 2020-09-20 LAB — INTRINSIC FACTOR ANTIBODIES: Intrinsic Factor: 10.3 AU/mL — ABNORMAL HIGH (ref 0.0–1.1)

## 2020-09-21 LAB — MULTIPLE MYELOMA PANEL, SERUM
Albumin SerPl Elph-Mcnc: 3.5 g/dL (ref 2.9–4.4)
Albumin/Glob SerPl: 1.1 (ref 0.7–1.7)
Alpha 1: 0.3 g/dL (ref 0.0–0.4)
Alpha2 Glob SerPl Elph-Mcnc: 0.7 g/dL (ref 0.4–1.0)
B-Globulin SerPl Elph-Mcnc: 1 g/dL (ref 0.7–1.3)
Gamma Glob SerPl Elph-Mcnc: 1.4 g/dL (ref 0.4–1.8)
Globulin, Total: 3.4 g/dL (ref 2.2–3.9)
IgA: 164 mg/dL (ref 64–422)
IgG (Immunoglobin G), Serum: 1479 mg/dL (ref 586–1602)
IgM (Immunoglobulin M), Srm: 43 mg/dL (ref 26–217)
M Protein SerPl Elph-Mcnc: 0.8 g/dL — ABNORMAL HIGH
Total Protein ELP: 6.9 g/dL (ref 6.0–8.5)

## 2020-09-23 ENCOUNTER — Other Ambulatory Visit: Payer: Self-pay | Admitting: Cardiology

## 2020-09-23 DIAGNOSIS — I10 Essential (primary) hypertension: Secondary | ICD-10-CM

## 2020-09-24 LAB — METHYLMALONIC ACID, SERUM: Methylmalonic Acid, Quantitative: 148 nmol/L (ref 0–378)

## 2020-09-25 NOTE — Progress Notes (Signed)
HEMATOLOGY/ONCOLOGY CONSULTATION NOTE  Date of Service: 09/26/2020  Patient Care Team: Chesley Noon, MD as PCP - General (Family Medicine)  CHIEF COMPLAINTS/PURPOSE OF CONSULTATION:  Monoclonal Paraproteinemia   HISTORY OF PRESENTING ILLNESS:  Brenda Barry is a wonderful 85 y.o. female who has been referred to [place] by Dr. Anastasia Pall for evaluation and management of Monoclonal Paraproteinemia. The pt reports that she is doing well overall.   The pt reports that she has had sciatica in the last year with referred pain down her leg, caused by a pinched nerve at L4/5.  The pt is intending to see Dr. Leonel Ramsay in neurosurgery soon. The pt's lower back pain has been evaluated with an MRI most recently on 04/24/18. The pt is also being evaluated by cardiology.  The pt denies any neck pain whatsoever and denies any new bone pains.   The pt notes that her kidney functions have been stable recently and has been pursuing care with Dr. Erling Cruz at [facility]. The pt notes that her blood pressure has been high, and is elevated more when she sees physicians. She has taken HCTZ, Toprol-XL, and Hydralazine. The pt notes that her thyroid medication has recently been adjusted.   Of note prior to the patient's presentation, the pt had a Bone Survey on 01/03/17 which revealed No definite lytic lesions within the axial skeleton. 2. Mixed lytic and blastic lesion posteriorly at C7. CT or MRI could be used for further evaluation of the cervical spine. 3. 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 No discrete lytic lesions are present.   Subsequently the pt then had a CT Cervical Spine on 02/21/17 which revealed CT scan does not show a convincing abnormality within the C7 vertebral body. However, within the posterior C3 vertebral body, there is a well-circumscribed lucent area measuring 8 x 4 x 5 mm which, though not specific or  conclusive, could represent a focus of myeloma.  Most recent lab results (04/21/18) of CMP is as follows: all values are WNL except for Glucose at 104, Creatinine at 1.07, GFR at 57, BUN at 10, Sodium at 145, Phosphorous at 2.2. 04/21/18 SPEP revealed all values WNL except for M-spike at 0.7, with IgG monoclonal protein with kappa light chain specificity.   On review of systems, pt reports sciatica, stable energy levels, stable lower back pain, and denies any neck pain, new bone pains, abdominal pains, chest wall pain, and any other symptoms.   On PMHx the pt reports Osteoporosis, Hypothyroidism, Hyperlipidemia, CAD with 5p stent place in late 90s, benign lumpectomy of right breast, HTN, IgG monoclonal gammopathy, CKD stage III. On Social Hx the pt reports that she quit smoking cigarettes about 50 years ago.  Interval History:   Brenda Barry returns today for management and evaluation of her MGUS. The patient's last visit with [place] was on 03/29/2020. The pt reports that she is doing well overall.  The pt reports no new concerns or symptoms. She experienced some neuropathy, but this has improved since her B12 replacement. She notes it is not too bothersome to her. This has also improved her energy levels, but she is still fatigued. The neuropathy in her feet is now just numbness when she lays on her right side. Her left side has almost completely resolved. The pt notes she has a hip replacement and pinched nerves in the past. The pt notes that she has been taking oral B12, as  opposed to SL as prescribed.  Lab results 09/19/2020 of CBC w/diff and CMP is as follows: all values are WNL except for Hgb of 11.4, HCT of 35.5, Creatinine of 1.26, GFR est of 43. 09/19/2020 MMP WNL except m protein of 0.8. 09/19/2020 Kappa Free light chain of 31.2, Kappa Lambda light chain ratio of 3.00. 09/19/2020 Vitamin B12 of 1,311. 09/19/2020 Methylmalonic Acid of 148. 09/19/2020 [place] Cell Antibody IgG of  57.1. 09/19/2020 Intrinsic Factor of 10.3.   On review of systems, pt reports improving neuropathy and denies fatigue, new bone pain, abdominal pain, back pain, leg swelling, and any other symptoms.  MEDICAL HISTORY:  HTN HLD Anxiety Sciatica  SURGICAL HISTORY: Past Surgical History:  Procedure Laterality Date  . HIP SURGERY Right 10/05/2018    SOCIAL HISTORY: Social History   Socioeconomic History  . Marital status: Widowed    Spouse name: Not on file  . Number of children: 3  . Years of education: Not on file  . Highest education level: Not on file  Occupational History  . Not on file  Tobacco Use  . Smoking status: Former Smoker    Packs/day: 0.25    Years: 10.00    Pack years: 2.50    Types: Cigarettes    Quit date: 04/19/1971    Years since quitting: 49.4  . Smokeless tobacco: Never Used  Vaping Use  . Vaping Use: Never used  Substance and Sexual Activity  . Alcohol use: No  . Drug use: No  . Sexual activity: Not on file  Other Topics Concern  . Not on file  Social History Narrative  . Not on file   Social Determinants of Health   Financial Resource Strain: Not on file  Food Insecurity: Not on file  Transportation Needs: Not on file  Physical Activity: Not on file  Stress: Not on file  Social Connections: Not on file  Intimate Partner Violence: Not on file    FAMILY HISTORY: Family History  Problem Relation Age of Onset  . Tuberculosis Mother   . Heart disease Father     ALLERGIES:  is allergic to amlodipine besylate, atorvastatin, isosorbide dinitrate, and rosuvastatin.  MEDICATIONS:  Current Outpatient Medications  Medication Sig Dispense Refill  . albuterol (PROVENTIL HFA;VENTOLIN HFA) 108 (90 Base) MCG/ACT inhaler Inhale 2 puffs into the lungs as needed.    Marland Kitchen aspirin (ASPIRIN CHILDRENS) 81 MG chewable tablet Chew 1 tablet (81 mg total) by mouth daily.    . B Complex-Biotin-FA (SUPER B-50 COMPLEX PO) Take 1 capsule by mouth.    .  budesonide-formoterol (SYMBICORT) 160-4.5 MCG/ACT inhaler Inhale into the lungs.    . cyanocobalamin 2000 MCG tablet Take 2,000 mcg by mouth.     . hydrALAZINE (APRESOLINE) 50 MG tablet Take 50 mg by mouth 2 (two) times daily.     Marland Kitchen latanoprost (XALATAN) 0.005 % ophthalmic solution Place 1 drop into both eyes at bedtime.    Marland Kitchen levothyroxine (SYNTHROID) 100 MCG tablet Take 100 mcg by mouth daily before breakfast.    . olmesartan-hydrochlorothiazide (BENICAR HCT) 40-12.5 MG tablet Take 1 tablet by mouth daily. (Patient taking differently: Take 1 tablet by mouth daily. Taking in the morning) 90 tablet 2  . rosuvastatin (CRESTOR) 20 MG tablet Take 1 tablet by mouth at bedtime.     No current facility-administered medications for this visit.    REVIEW OF SYSTEMS:   10 Point review of Systems was done is negative except as noted above.  PHYSICAL EXAMINATION:  .  Vitals:   09/26/20 0954  BP: (!) 161/60  Pulse: 69  Resp: 18  Temp: (!) 95.6 F (35.3 C)  SpO2: 100%   Filed Weights   09/26/20 0954  Weight: 221 lb 3.2 oz (100.3 kg)   .Body mass index is 36.81 kg/m.  Exam was given in a chair.   GENERAL:alert, in no acute distress and comfortable SKIN: no acute rashes, no significant lesions EYES: conjunctiva are pink and non-injected, sclera anicteric OROPHARYNX: MMM, no exudates, no oropharyngeal erythema or ulceration NECK: supple, no JVD LYMPH:  no palpable lymphadenopathy in the cervical, axillary or inguinal regions LUNGS: clear to auscultation b/l with normal respiratory effort HEART: regular rate & rhythm ABDOMEN:  normoactive bowel sounds , non tender, not distended. Extremity: no pedal edema PSYCH: alert & oriented x 3 with fluent speech NEURO: no focal motor/sensory deficits  LABORATORY DATA:  I have reviewed the data as listed  . CBC Latest Ref Rng & Units 09/19/2020 07/31/2020 03/22/2020  WBC 4.0 - 10.5 K/uL 6.3 6.0 6.4  Hemoglobin 12.0 - 15.0 g/dL 11.4(L) 11.6 10.8(L)   Hematocrit 36.0 - 46.0 % 35.5(L) 36.6 33.5(L)  Platelets 150 - 400 K/uL 274 307 252    . CMP Latest Ref Rng & Units 09/19/2020 07/31/2020 03/22/2020  Glucose 70 - 99 mg/dL 94 92 95  BUN 8 - 23 mg/dL $Remove'16 16 19  'BBSaqaU$ Creatinine 0.44 - 1.00 mg/dL 1.26(H) 1.25(H) 1.22(H)  Sodium 135 - 145 mmol/L 139 141 140  Potassium 3.5 - 5.1 mmol/L 4.5 4.6 3.9  Chloride 98 - 111 mmol/L 107 106 106  CO2 22 - 32 mmol/L $RemoveB'26 24 25  'vgDMQreE$ Calcium 8.9 - 10.3 mg/dL 9.5 9.7 10.3  Total Protein 6.5 - 8.1 g/dL 7.7 - 7.3  Total Bilirubin 0.3 - 1.2 mg/dL 0.4 - 0.5  Alkaline Phos 38 - 126 U/L 64 - 60  AST 15 - 41 U/L 25 - 25  ALT 0 - 44 U/L 19 - 14    03/16/2019 MMP       04/17/18 CBC:    04/21/18 CMP:    04/21/18 SPEP:   04/21/18 Immunofixation:    RADIOGRAPHIC STUDIES: I have personally reviewed the radiological images as listed and agreed with the findings in the report. No results found.  ASSESSMENT & PLAN:   85 y.o. female with  1. MGUS- Monoclonal Gammopathy or undetermined significance. 01/03/17 Bone Survey revealed No definite lytic lesions within the axial skeleton. 2. Mixed lytic and blastic lesion posteriorly at C7. CT or MRI could be used for further evaluation of the cervical spine. 3. 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 No discrete lytic lesions are present.  02/21/17 CT Cervical Spine revealed CT scan does not show a convincing abnormality within the C7 vertebral body. However, within the posterior C3 vertebral body, there is a well-circumscribed lucent area measuring 8 x 4 x 5 mm which, though not specific or conclusive, could represent a focus of myeloma.  04/24/18 MRI Lumbar Spine revealed degenerative disc disease, multilevel disc bulges and disc osteophytes and advised that the pt speak with her PCP regarding 2.9cm cystic lesion in right adnexa  Labs from initial presentation from 04/21/18: M Protein at 0.7g  2. Pernicious  anemia PLAN: -Discussed pt labwork, 09/19/2020; blood counts and chemistries stable, light chain and MMP stable, pernicious anemia confirmed.  -Advised pt that pernicious anemia affects the way that the body absorbs Vitamin B12. Antibodies to cell that makes  acid in stomach and intrinsic factor affects B12, Iron, Vitamin D, and other B vitamins. -Advised pt that inadequate Vitamin D levels is increased risk for osteoporosis. Pernicious anemia and post-menopausal are both factors for decreased Vitamin D levels. -Advised pt we will check Iron labs at next visit. -The pt shows no overt clinical or lab transformation of her MGUS at this time. -Will continue to monitor pt with labs and clinical every 6 months for progression to Multiple Myeloma. -Continue oral 2000 mcg Vitamin B12 and one tablet B-complex daily. Pt is aware she can take 1000 mcg SL/ Liquid Vitamin B12 daily once current oral supply finished. -Reccommended pt take 2000 IU Vitamin D daily. -Continue Synthroid as prescribed.  -Will see back in 6 months with labs.   2. HTN -chronically uncontrolled . Element of white coat hypertension. Patient notes BP much better at home. No CP/SOB or headaches. Rpt BP in clinic 170/84  PLAN -continue compliance with anti HTN -continue f/u with PCP to optimize BP control   FOLLOW UP: RTC with Dr Irene Limbo with labs in 6 months. Plz schedule labs 1 week prior to clinic visit   The total time spent in the appointment was 20 minutes and more than 50% was on counseling and direct patient cares.  All of the patient's questions were answered with apparent satisfaction. The patient knows to call the clinic with any problems, questions or concerns.    Sullivan Lone MD [place] AAHIVMS [facility] Hematology/Oncology Physician [facility]  (Office):       [phone] (Work cell):  [phone] (Fax):           [phone]  09/26/2020 10:35 AM  I, Reinaldo Raddle, am acting as scribe for Dr. Sullivan Lone, MD.     .I have reviewed the above documentation for accuracy and completeness, and I agree with the above. Brunetta Genera MD

## 2020-09-26 ENCOUNTER — Inpatient Hospital Stay (HOSPITAL_BASED_OUTPATIENT_CLINIC_OR_DEPARTMENT_OTHER): Payer: Medicare Other | Admitting: Hematology

## 2020-09-26 ENCOUNTER — Other Ambulatory Visit: Payer: Self-pay

## 2020-09-26 ENCOUNTER — Telehealth: Payer: Self-pay | Admitting: Hematology

## 2020-09-26 VITALS — BP 161/60 | HR 69 | Temp 95.6°F | Resp 18 | Ht 65.0 in | Wt 221.2 lb

## 2020-09-26 DIAGNOSIS — D510 Vitamin B12 deficiency anemia due to intrinsic factor deficiency: Secondary | ICD-10-CM | POA: Diagnosis not present

## 2020-09-26 DIAGNOSIS — D472 Monoclonal gammopathy: Secondary | ICD-10-CM

## 2020-09-26 DIAGNOSIS — I6523 Occlusion and stenosis of bilateral carotid arteries: Secondary | ICD-10-CM

## 2020-09-26 NOTE — Telephone Encounter (Signed)
Scheduled appointments per 3/8 los. Spoke to patient who is aware of appointments dates and times. Gave patient calendar print out.  

## 2020-10-13 ENCOUNTER — Ambulatory Visit: Payer: Medicare Other | Admitting: Podiatry

## 2020-10-25 ENCOUNTER — Other Ambulatory Visit: Payer: Self-pay

## 2020-10-25 ENCOUNTER — Encounter: Payer: Self-pay | Admitting: Podiatry

## 2020-10-25 ENCOUNTER — Ambulatory Visit (INDEPENDENT_AMBULATORY_CARE_PROVIDER_SITE_OTHER): Payer: Medicare Other | Admitting: Podiatry

## 2020-10-25 DIAGNOSIS — B351 Tinea unguium: Secondary | ICD-10-CM

## 2020-10-25 DIAGNOSIS — D689 Coagulation defect, unspecified: Secondary | ICD-10-CM

## 2020-10-25 DIAGNOSIS — M79675 Pain in left toe(s): Secondary | ICD-10-CM

## 2020-10-25 DIAGNOSIS — I739 Peripheral vascular disease, unspecified: Secondary | ICD-10-CM

## 2020-10-25 DIAGNOSIS — N183 Chronic kidney disease, stage 3 unspecified: Secondary | ICD-10-CM

## 2020-10-25 DIAGNOSIS — M79674 Pain in right toe(s): Secondary | ICD-10-CM

## 2020-10-25 NOTE — Progress Notes (Signed)
This patient returns to my office for at risk foot care.  This patient requires this care by a professional since this patient will be at risk due to having chronic kidney disease and PAD.Marland Kitchen  This patient is unable to cut nails herself since the patient cannot reach her nails.These nails are painful walking and wearing shoes.  This patient presents for at risk foot care today.  General Appearance  Alert, conversant and in no acute stress.  Vascular  Dorsalis pedis and posterior tibial  pulses are palpable  bilaterally.  Capillary return is within normal limits  bilaterally. Temperature is within normal limits  bilaterally.  Neurologic  Senn-Weinstein monofilament wire test within normal limits  bilaterally. Muscle power within normal limits bilaterally.  Nails Thick disfigured discolored nails with subungual debris  from hallux to fifth toes bilaterally. No evidence of bacterial infection or drainage bilaterally.  Orthopedic  No limitations of motion  feet .  No crepitus or effusions noted.  No bony pathology or digital deformities noted.  HAV  B/L.  Skin  normotropic skin with no porokeratosis noted bilaterally.  No signs of infections or ulcers noted.   Asymptomatic HD 5th left foot.  Onychomycosis  Pain in right toes  Pain in left toes  Consent was obtained for treatment procedures.   Mechanical debridement of nails 1-5  bilaterally performed with a nail nipper.  Filed with dremel without incident.    Return office visit   4 months                   Told patient to return for periodic foot care and evaluation due to potential at risk complications.   Gardiner Barefoot DPM

## 2020-10-29 ENCOUNTER — Other Ambulatory Visit: Payer: Self-pay | Admitting: Cardiology

## 2020-12-15 ENCOUNTER — Other Ambulatory Visit: Payer: Federal, State, Local not specified - PPO

## 2020-12-19 ENCOUNTER — Ambulatory Visit: Payer: Medicare Other

## 2020-12-19 ENCOUNTER — Other Ambulatory Visit: Payer: Self-pay

## 2020-12-19 DIAGNOSIS — I6523 Occlusion and stenosis of bilateral carotid arteries: Secondary | ICD-10-CM

## 2020-12-20 NOTE — Progress Notes (Signed)
Carotid artery duplex 12/19/2020: Stenosis in the right internal carotid artery (16-49%). Stenosis in the right external carotid artery (<50%). Stenosis in the left internal carotid artery (16-49%). Stenosis in the left external carotid artery (<50%). Antegrade right vertebral artery flow. Antegrade left vertebral artery flow. Compared to 06/01/2020, left ICA stenosis of >50%is now <50%. Follow up in one year is appropriate if clinically indicated.

## 2021-01-12 ENCOUNTER — Encounter: Payer: Self-pay | Admitting: Cardiology

## 2021-01-12 ENCOUNTER — Other Ambulatory Visit: Payer: Self-pay

## 2021-01-12 ENCOUNTER — Ambulatory Visit: Payer: Medicare Other | Admitting: Cardiology

## 2021-01-12 VITALS — BP 167/85 | HR 90 | Temp 98.4°F | Resp 126 | Ht 65.0 in | Wt 219.0 lb

## 2021-01-12 DIAGNOSIS — E7800 Pure hypercholesterolemia, unspecified: Secondary | ICD-10-CM

## 2021-01-12 DIAGNOSIS — I2510 Atherosclerotic heart disease of native coronary artery without angina pectoris: Secondary | ICD-10-CM

## 2021-01-12 DIAGNOSIS — I10 Essential (primary) hypertension: Secondary | ICD-10-CM

## 2021-01-12 DIAGNOSIS — I739 Peripheral vascular disease, unspecified: Secondary | ICD-10-CM

## 2021-01-12 NOTE — Progress Notes (Signed)
Primary Physician/Referring:  Chesley Noon, MD  Patient ID: Brenda Barry, female    DOB: 1935/09/12, 85 y.o.   MRN: [id number]  Chief Complaint  Patient presents with   PAD   Follow-up   HPI:    Brenda Barry  is a 85 y.o.  African-American female with known coronary artery disease with remote coronary stenting 2001 and history of brachytherapy due to restenosis, difficult to control hypertension, asymptomatic bilateral carotid artery disease, PAD with both neurogenic claudication and vascular claudication, stage III chronic kidney disease due to hypertension, hyperlipidemia.  She is presently doing well and is a 85-monthoffice visit.  States that her blood pressure has been well controlled at home, dyspnea has remained stable, she has not had any chest pain and occasional mild cramping in her legs but has not been lifestyle limiting.  No leg edema, no palpitations.  Past Medical History:  Diagnosis Date   Bilateral carotid bruits 02/12/2019   Peripheral artery disease ([place] 02/12/2019   Past Surgical History:  Procedure Laterality Date   HIP SURGERY Right 10/05/2018   Family History  Problem Relation Age of Onset   Tuberculosis Mother    Heart disease Father     Social History   Tobacco Use   Smoking status: Former    Packs/day: 0.25    Years: 10.00    Pack years: 2.50    Types: Cigarettes    Quit date: 04/19/1971    Years since quitting: 49.7   Smokeless tobacco: Never  Substance Use Topics   Alcohol use: No   Marital Status: Widowed ROS  Review of Systems  Constitutional: Negative for weight gain.  Cardiovascular:  Positive for claudication and dyspnea on exertion. Negative for leg swelling.  Musculoskeletal:  Positive for arthritis and back pain.  Gastrointestinal:  Negative for melena.  Neurological:  Positive for paresthesias (bilateral legs). Negative for headaches.  Psychiatric/Behavioral:  The patient is nervous/anxious.   All other systems  reviewed and are negative. Objective  Blood pressure (!) 167/85, pulse 90, temperature 98.4 F (36.9 C), temperature source Temporal, resp. rate (!) 126, height _0  (1.651 m), weight 219 lb (99.3 kg), SpO2 98 %.  Vitals with BMI 01/12/2021 09/26/2020 08/09/2020  Height _1  _2  -  Weight 219 lbs 221 lbs 3 oz -  BMI 374.16338.45-  Systolic [id number] Diastolic 85 60 84  Pulse 90 69 63     Physical Exam Constitutional:      Comments: Moderately obese patient in no distress  Cardiovascular:     Rate and Rhythm: Normal rate and regular rhythm. FrequentExtrasystoles are present.    Pulses: Intact distal pulses.          Carotid pulses are  on the left side with bruit.      Femoral pulses are 2+ on the right side and 2+ on the left side.      Popliteal pulses are 1+ on the right side and 1+ on the left side.       Dorsalis pedis pulses are 2+ on the right side and 2+ on the left side.       Posterior tibial pulses are 0 on the right side and 0 on the left side.     Heart sounds: S1 normal and S2 normal. Murmur heard.  Harsh early systolic murmur is present with a grade of 2/6 at the upper right sternal border radiating to the neck.  No gallop.     Comments: No JVD or leg edema. Pulmonary:     Effort: Pulmonary effort is normal.     Breath sounds: Normal breath sounds.  Abdominal:     General: Bowel sounds are normal.     Palpations: Abdomen is soft.     Comments: obese   Laboratory examination:   Recent Labs    03/22/20 0907 07/31/20 0917 09/19/20 0954  NA 140 141 139  K 3.9 4.6 4.5  CL 106 106 107  CO2 _0 GLUCOSE 95 92 94  BUN _1 CREATININE 1.22* 1.25* 1.26*  CALCIUM 10.3 9.7 9.5  GFRNONAA 41* 40* 43*  GFRAA 48* 46*  --    CrCl cannot be calculated (Patient's most recent lab result is older than the maximum 21 days allowed.).  CMP Latest Ref Rng & Units 09/19/2020 07/31/2020 03/22/2020  Glucose 70 - 99 mg/dL 94 92 95  BUN 8 - 23 mg/dL _2 Creatinine 0.44 - 1.00 mg/dL 1.26(H) 1.25(H) 1.22(H)  Sodium 135 - 145 mmol/L 139 141 140  Potassium 3.5 - 5.1 mmol/L 4.5 4.6 3.9  Chloride 98 - 111 mmol/L 107 106 106  CO2 22 - 32 mmol/L _3 Calcium 8.9 - 10.3 mg/dL 9.5 9.7 10.3  Total Protein 6.5 - 8.1 g/dL 7.7 - 7.3  Total Bilirubin 0.3 - 1.2 mg/dL 0.4 - 0.5  Alkaline Phos 38 - 126 U/L 64 - 60  AST 15 - 41 U/L 25 - 25  ALT 0 - 44 U/L 19 - 14   CBC Latest Ref Rng & Units 09/19/2020 07/31/2020 03/22/2020  WBC 4.0 - 10.5 K/uL 6.3 6.0 6.4  Hemoglobin 12.0 - 15.0 g/dL 11.4(L) 11.6 10.8(L)  Hematocrit 36.0 - 46.0 % 35.5(L) 36.6 33.5(L)  Platelets 150 - 400 K/uL 274 307 252    External labs:   Labs 02/18/2020:  Total cholesterol 177, triglycerides 73, HDL 78, LDL 85.  Non-HDL cholesterol 99.  BUN 16, creatinine 1.24, EGFR 47 mL.  TSH normal.  Lipid Panel 10/15/2019: Cholesterol, total 187.000 10/15/2019 Triglycerides 76.000 10/15/2019 HDL 79.000 10/15/2019 LDL-C 121.000 M 08/22/2016  Glucose Random 92.000 09/22/2019  TSH 0.590 10/15/2019 A1C 6.000 % 08/22/2016  Medications and allergies   Allergies  Allergen Reactions   Amlodipine Besylate Other (See Comments)    Nightmares and nerve pain Nightmares and nerve pain   Atorvastatin Other (See Comments)    nightmare nightmare   Isosorbide Dinitrate Other (See Comments)    Nerve pain, lower back pain, feels out of it Nerve pain, lower back pain, feels out of it   Rosuvastatin Other (See Comments)    Nightmare.  Hence takes it during day    Current Outpatient Medications on File Prior to Visit  Medication Sig Dispense Refill   albuterol (PROVENTIL HFA;VENTOLIN HFA) 108 (90 Base) MCG/ACT inhaler Inhale 2 puffs into the lungs as needed.     aspirin (ASPIRIN CHILDRENS) 81 MG chewable tablet Chew 1 tablet (81 mg total) by mouth daily.     B Complex-Biotin-FA (SUPER B-50 COMPLEX PO) Take 1 capsule by mouth.     budesonide-formoterol (SYMBICORT) 160-4.5 MCG/ACT inhaler Inhale  into the lungs.     cyanocobalamin 2000 MCG tablet Take 2,000 mcg by mouth.      hydrALAZINE (APRESOLINE) 50 MG tablet TAKE 1 TABLET(50 MG) BY MOUTH THREE TIMES DAILY (Patient taking differently: Taking it BID) 270 tablet 0   latanoprost (XALATAN) 0.005 %  ophthalmic solution Place 1 drop into both eyes at bedtime.     levothyroxine (SYNTHROID) 100 MCG tablet Take 100 mcg by mouth daily before breakfast.     olmesartan-hydrochlorothiazide (BENICAR HCT) 40-12.5 MG tablet Take 1 tablet by mouth daily. (Patient taking differently: Take 1 tablet by mouth daily. Taking in the morning) 90 tablet 2   rosuvastatin (CRESTOR) 20 MG tablet Take 1 tablet by mouth at bedtime.     No current facility-administered medications on file prior to visit.     Radiology:  No results found.  Cardiac Studies:   Coronary stent in ? 2001 and restenosis and brachytherapy following year, no details available in EPIC.  Lower extremity arterial duplex 05/15/2018: Right mid SFA is occluded with distal reconstitution. No hemodynamically significant stenoses are identified in the left lower extremity arterial system. This exam reveals normal perfusion of the lower extremity (ABI 1.00 bilaterally).  Renal artery duplex  10/27/2019:  No evidence of renal artery occlusive disease in either renal artery.  Renal length is within normal limits for both kidneys.  Normal abdominal aorta flow velocities noted.   Echocardiogram 07/07/2020:  Normal LV systolic function with EF 55%. Left ventricle cavity is normal in size. Normal global wall motion. Doppler evidence of grade I (impaired) diastolic dysfunction. Calculated EF 55%. Left atrial cavity is mildly dilated. Native trileaflet aortic valve with no regurgitation. Mild aortic valve leaflet calcification. Mildly restricted aortic valve leaflets. No evidence of aortic valve stenosis. Structurally normal mitral valve.  Mild (Grade I) mitral regurgitation. Structurally normal  tricuspid valve.  Mild tricuspid regurgitation. No evidence of pulmonary hypertension. Compared to the study done on 7/86/7672, grade 2 diastolic dysfunction not noted, moderate posterior MR is not appreciated. Tricuspid regurgitation previously was mild to moderate, now is mild with normal PA pressure, previously 32 mmHg.  Carotid artery duplex 12/19/2020: Stenosis in the right internal carotid artery (16-49%). Stenosis in the right external carotid artery (<50%). Stenosis in the left internal carotid artery (16-49%). Stenosis in the left external carotid artery (<50%). Antegrade right vertebral artery flow. Antegrade left vertebral artery flow. Compared to 06/01/2020, left ICA stenosis of >50%is now <50%. Follow up in one year is appropriate if clinically indicated.     EKG    EKG 01/12/2021: Normal sinus rhythm at rate of 82 bpm, normal axis, no evidence of ischemia.  PACs (2).  No significant change from 08/09/2020.    Assessment     ICD-10-CM   1. Coronary artery disease involving native coronary artery of native heart without angina pectoris  I25.10     2. Peripheral artery disease (HCC)  I73.9 EKG 12-Lead    3. Hypercholesteremia  E78.00     4. Essential hypertension  I10     5. White coat syndrome with diagnosis of hypertension  I10       No orders of the defined types were placed in this encounter.   There are no discontinued medications.   Recommendations:   Brenda Barry  is a 85 y.o. African-American female with coronary artery disease & coronary artery stenting 2001 and history of brachytherapy due to restenosis, difficult to control hypertension, asymptomatic bilateral carotid artery disease, PAD with both neurogenic claudication and vascular claudication, stage III chronic kidney disease due to hypertension, hyperlipidemia. Due to difficulty in BP control. Remote Patient Monitoring and Principal Care Management as patient is high risk for hospitalization and  complications from underlying medical conditions.   On her last office visit after confirming  EKG that she had PACs and not A. fib, I discontinued Eliquis.  She is now on aspirin which she is tolerating.  She remains asymptomatic without any chest pain, dyspnea or leg edema or claudication.  She has whitecoat hypertension, patient presently enrolled in PCM and RPM and blood pressure have been under excellent control.  Hence I did not make any changes.  I reviewed her lipids, LDL goal is <70.  Patient has made lifestyle changes, she has lost 3 pounds in weight, encouraged her to lose additional 5 to 10 pounds and she has been scheduled for complete physical next month with Dr. Melford Aase.  Otherwise stable, no significant change in carotid duplex and will continue annual surveillance.  I will see her back in a year.     Adrian Prows, MD, [facility] 01/12/2021, 10:00 AM Office: [phone] Fax: [phone] Pager: [phone]

## 2021-02-28 ENCOUNTER — Other Ambulatory Visit: Payer: Self-pay

## 2021-02-28 ENCOUNTER — Ambulatory Visit (INDEPENDENT_AMBULATORY_CARE_PROVIDER_SITE_OTHER): Payer: Medicare Other | Admitting: Podiatry

## 2021-02-28 ENCOUNTER — Encounter: Payer: Self-pay | Admitting: Podiatry

## 2021-02-28 DIAGNOSIS — B351 Tinea unguium: Secondary | ICD-10-CM

## 2021-02-28 DIAGNOSIS — N183 Chronic kidney disease, stage 3 unspecified: Secondary | ICD-10-CM

## 2021-02-28 DIAGNOSIS — D689 Coagulation defect, unspecified: Secondary | ICD-10-CM | POA: Diagnosis not present

## 2021-02-28 DIAGNOSIS — M79675 Pain in left toe(s): Secondary | ICD-10-CM

## 2021-02-28 DIAGNOSIS — I739 Peripheral vascular disease, unspecified: Secondary | ICD-10-CM

## 2021-02-28 DIAGNOSIS — M79674 Pain in right toe(s): Secondary | ICD-10-CM

## 2021-02-28 NOTE — Progress Notes (Signed)
This patient returns to my office for at risk foot care.  This patient requires this care by a professional since this patient will be at risk due to having chronic kidney disease and PAD.Marland Kitchen  This patient is unable to cut nails herself since the patient cannot reach her nails.These nails are painful walking and wearing shoes.  This patient presents for at risk foot care today.  General Appearance  Alert, conversant and in no acute stress.  Vascular  Dorsalis pedis and posterior tibial  pulses are palpable  bilaterally.  Capillary return is within normal limits  bilaterally. Temperature is within normal limits  bilaterally.  Neurologic  Senn-Weinstein monofilament wire test within normal limits  bilaterally. Muscle power within normal limits bilaterally.  Nails Thick disfigured discolored nails with subungual debris  from hallux to fifth toes bilaterally. No evidence of bacterial infection or drainage bilaterally.  Orthopedic  No limitations of motion  feet .  No crepitus or effusions noted.  No bony pathology or digital deformities noted.  HAV  B/L.  Skin  normotropic skin with no porokeratosis noted bilaterally.  No signs of infections or ulcers noted.   Asymptomatic HD 5th left foot.  Onychomycosis  Pain in right toes  Pain in left toes  Consent was obtained for treatment procedures.   Mechanical debridement of nails 1-5  bilaterally performed with a nail nipper.  Filed with dremel without incident.    Return office visit   3 months                   Told patient to return for periodic foot care and evaluation due to potential at risk complications.   Gardiner Barefoot DPM

## 2021-03-27 ENCOUNTER — Other Ambulatory Visit: Payer: Self-pay

## 2021-03-27 ENCOUNTER — Inpatient Hospital Stay: Payer: Medicare Other | Attending: Hematology

## 2021-03-27 DIAGNOSIS — Z79899 Other long term (current) drug therapy: Secondary | ICD-10-CM | POA: Insufficient documentation

## 2021-03-27 DIAGNOSIS — I10 Essential (primary) hypertension: Secondary | ICD-10-CM | POA: Diagnosis not present

## 2021-03-27 DIAGNOSIS — D472 Monoclonal gammopathy: Secondary | ICD-10-CM | POA: Insufficient documentation

## 2021-03-27 DIAGNOSIS — D510 Vitamin B12 deficiency anemia due to intrinsic factor deficiency: Secondary | ICD-10-CM | POA: Insufficient documentation

## 2021-03-27 DIAGNOSIS — Z87891 Personal history of nicotine dependence: Secondary | ICD-10-CM | POA: Insufficient documentation

## 2021-03-27 LAB — CBC WITH DIFFERENTIAL (CANCER CENTER ONLY)
Abs Immature Granulocytes: 0.01 10*3/uL (ref 0.00–0.07)
Basophils Absolute: 0 10*3/uL (ref 0.0–0.1)
Basophils Relative: 1 %
Eosinophils Absolute: 0.3 10*3/uL (ref 0.0–0.5)
Eosinophils Relative: 5 %
HCT: 32.9 % — ABNORMAL LOW (ref 36.0–46.0)
Hemoglobin: 10.9 g/dL — ABNORMAL LOW (ref 12.0–15.0)
Immature Granulocytes: 0 %
Lymphocytes Relative: 47 %
Lymphs Abs: 2.7 10*3/uL (ref 0.7–4.0)
MCH: 28.5 pg (ref 26.0–34.0)
MCHC: 33.1 g/dL (ref 30.0–36.0)
MCV: 86.1 fL (ref 80.0–100.0)
Monocytes Absolute: 0.4 10*3/uL (ref 0.1–1.0)
Monocytes Relative: 7 %
Neutro Abs: 2.3 10*3/uL (ref 1.7–7.7)
Neutrophils Relative %: 40 %
Platelet Count: 272 10*3/uL (ref 150–400)
RBC: 3.82 MIL/uL — ABNORMAL LOW (ref 3.87–5.11)
RDW: 14.4 % (ref 11.5–15.5)
WBC Count: 5.8 10*3/uL (ref 4.0–10.5)
nRBC: 0 % (ref 0.0–0.2)

## 2021-03-27 LAB — CMP (CANCER CENTER ONLY)
ALT: 17 U/L (ref 0–44)
AST: 23 U/L (ref 15–41)
Albumin: 3.8 g/dL (ref 3.5–5.0)
Alkaline Phosphatase: 62 U/L (ref 38–126)
Anion gap: 8 (ref 5–15)
BUN: 19 mg/dL (ref 8–23)
CO2: 26 mmol/L (ref 22–32)
Calcium: 9.9 mg/dL (ref 8.9–10.3)
Chloride: 108 mmol/L (ref 98–111)
Creatinine: 1.36 mg/dL — ABNORMAL HIGH (ref 0.44–1.00)
GFR, Estimated: 39 mL/min — ABNORMAL LOW (ref >60)
Glucose, Bld: 94 mg/dL (ref 70–99)
Potassium: 4.1 mmol/L (ref 3.5–5.1)
Sodium: 142 mmol/L (ref 135–145)
Total Bilirubin: 0.4 mg/dL (ref 0.3–1.2)
Total Protein: 7.5 g/dL (ref 6.5–8.1)

## 2021-03-27 LAB — VITAMIN B12: Vitamin B-12: 1613 pg/mL — ABNORMAL HIGH (ref 180–914)

## 2021-03-28 ENCOUNTER — Other Ambulatory Visit: Payer: Medicare Other

## 2021-03-28 LAB — KAPPA/LAMBDA LIGHT CHAINS
Kappa free light chain: 38.9 mg/L — ABNORMAL HIGH (ref 3.3–19.4)
Kappa, lambda light chain ratio: 2.86 — ABNORMAL HIGH (ref 0.26–1.65)
Lambda free light chains: 13.6 mg/L (ref 5.7–26.3)

## 2021-03-29 LAB — MULTIPLE MYELOMA PANEL, SERUM
Albumin SerPl Elph-Mcnc: 3.6 g/dL (ref 2.9–4.4)
Albumin/Glob SerPl: 1.1 (ref 0.7–1.7)
Alpha 1: 0.3 g/dL (ref 0.0–0.4)
Alpha2 Glob SerPl Elph-Mcnc: 0.7 g/dL (ref 0.4–1.0)
B-Globulin SerPl Elph-Mcnc: 1 g/dL (ref 0.7–1.3)
Gamma Glob SerPl Elph-Mcnc: 1.3 g/dL (ref 0.4–1.8)
Globulin, Total: 3.3 g/dL (ref 2.2–3.9)
IgA: 176 mg/dL (ref 64–422)
IgG (Immunoglobin G), Serum: 1401 mg/dL (ref 586–1602)
IgM (Immunoglobulin M), Srm: 50 mg/dL (ref 26–217)
M Protein SerPl Elph-Mcnc: 0.8 g/dL — ABNORMAL HIGH
Total Protein ELP: 6.9 g/dL (ref 6.0–8.5)

## 2021-04-03 ENCOUNTER — Other Ambulatory Visit: Payer: Self-pay

## 2021-04-03 ENCOUNTER — Inpatient Hospital Stay: Payer: Medicare Other

## 2021-04-03 ENCOUNTER — Inpatient Hospital Stay (HOSPITAL_BASED_OUTPATIENT_CLINIC_OR_DEPARTMENT_OTHER): Payer: Medicare Other | Admitting: Hematology

## 2021-04-03 VITALS — BP 199/82 | HR 76 | Temp 98.2°F | Resp 16 | Ht 65.0 in | Wt 216.0 lb

## 2021-04-03 DIAGNOSIS — D510 Vitamin B12 deficiency anemia due to intrinsic factor deficiency: Secondary | ICD-10-CM | POA: Diagnosis not present

## 2021-04-03 DIAGNOSIS — D472 Monoclonal gammopathy: Secondary | ICD-10-CM

## 2021-04-03 LAB — IRON AND TIBC
Iron: 48 ug/dL (ref 28–170)
Saturation Ratios: 14 % (ref 10.4–31.8)
TIBC: 342 ug/dL (ref 250–450)
UIBC: 294 ug/dL

## 2021-04-03 LAB — FERRITIN: Ferritin: 34 ng/mL (ref 11–307)

## 2021-04-04 ENCOUNTER — Ambulatory Visit: Payer: Medicare Other | Admitting: Hematology

## 2021-04-04 ENCOUNTER — Telehealth: Payer: Self-pay | Admitting: Hematology

## 2021-04-04 MED ORDER — POLYSACCHARIDE IRON COMPLEX 150 MG PO CAPS: 150.0000 mg | ORAL_CAPSULE | Freq: Every day | ORAL | 4 refills | Status: DC

## 2021-04-04 NOTE — Telephone Encounter (Signed)
Scheduled follow-up appointments per 9/13 los. Patient is aware. 

## 2021-04-09 NOTE — Progress Notes (Signed)
HEMATOLOGY/ONCOLOGY CLINIC NOTE  Date of Service: 04/09/2021  Patient Care Team: Eartha Inch, MD as PCP - General (Family Medicine)  CHIEF COMPLAINTS/PURPOSE OF CONSULTATION:  Monoclonal Paraproteinemia   HISTORY OF PRESENTING ILLNESS:  Brenda Barry is a wonderful 85 y.o. female who has been referred to [place] by Dr. Antony Haste for evaluation and management of Monoclonal Paraproteinemia. The pt reports that she is doing well overall.   The pt reports that she has had sciatica in the last year with referred pain down her leg, caused by a pinched nerve at L4/5.  The pt is intending to see Dr. Amada Jupiter in neurosurgery soon. The pt's lower back pain has been evaluated with an MRI most recently on 04/24/18. The pt is also being evaluated by cardiology.  The pt denies any neck pain whatsoever and denies any new bone pains.   The pt notes that her kidney functions have been stable recently and has been pursuing care with Dr. Casimiro Needle at [facility]. The pt notes that her blood pressure has been high, and is elevated more when she sees physicians. She has taken HCTZ, Toprol-XL, and Hydralazine. The pt notes that her thyroid medication has recently been adjusted.   Of note prior to the patient's presentation, the pt had a Bone Survey on 01/03/17 which revealed No definite lytic lesions within the axial skeleton. 2. Mixed lytic and blastic lesion posteriorly at C7. CT or MRI could be used for further evaluation of the cervical spine. 3. 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 No discrete lytic lesions are present.   Subsequently the pt then had a CT Cervical Spine on 02/21/17 which revealed CT scan does not show a convincing abnormality within the C7 vertebral body. However, within the posterior C3 vertebral body, there is a well-circumscribed lucent area measuring 8 x 4 x 5 mm which, though not specific or  conclusive, could represent a focus of myeloma.  Most recent lab results (04/21/18) of CMP is as follows: all values are WNL except for Glucose at 104, Creatinine at 1.07, GFR at 57, BUN at 10, Sodium at 145, Phosphorous at 2.2. 04/21/18 SPEP revealed all values WNL except for M-spike at 0.7, with IgG monoclonal protein with kappa light chain specificity.   On review of systems, pt reports sciatica, stable energy levels, stable lower back pain, and denies any neck pain, new bone pains, abdominal pains, chest wall pain, and any other symptoms.   On PMHx the pt reports Osteoporosis, Hypothyroidism, Hyperlipidemia, CAD with 5p stent place in late 90s, benign lumpectomy of right breast, HTN, IgG monoclonal gammopathy, CKD stage III. On Social Hx the pt reports that she quit smoking cigarettes about 50 years ago.  Interval History:   Brenda Barry returns today for management and evaluation of her MGUS. The patient's last visit with [place] was on 09/26/2020. The pt reports that she is doing well overall.  The pt reports no new concerns or symptoms.  She notes that she still has some neuropathy which is stable. Continues to take for vitamin B12 for pernicious anemia.  Notes some mild fatigue but no other acute new symptoms. No new focal bone pains.  Lab results 03/27/2021 CBC shows mild anemia with a hemoglobin of 10.9 but otherwise unremarkable. CMP unremarkable other than mild chronic kidney disease with a creatinine of 1.36 which is stable B12 levels adequately replaced at this time. Myeloma panel shows stable M  protein and 0.8 g/dL which is unchanged over the last 6 months. Ferritin level of 34 with an iron saturation of 14%.  She continues to follow-up with her podiatrist primary care physician and cardiology.   MEDICAL HISTORY:  HTN HLD Anxiety Sciatica  SURGICAL HISTORY: Past Surgical History:  Procedure Laterality Date   HIP SURGERY Right 10/05/2018    SOCIAL HISTORY: Social  History   Socioeconomic History   Marital status: Widowed    Spouse name: Not on file   Number of children: 3   Years of education: Not on file   Highest education level: Not on file  Occupational History   Not on file  Tobacco Use   Smoking status: Former    Packs/day: 0.25    Years: 10.00    Pack years: 2.50    Types: Cigarettes    Quit date: 04/19/1971    Years since quitting: 50.0   Smokeless tobacco: Never  Vaping Use   Vaping Use: Never used  Substance and Sexual Activity   Alcohol use: No   Drug use: No   Sexual activity: Not on file  Other Topics Concern   Not on file  Social History Narrative   Not on file   Social Determinants of Health   Financial Resource Strain: Not on file  Food Insecurity: Not on file  Transportation Needs: Not on file  Physical Activity: Not on file  Stress: Not on file  Social Connections: Not on file  Intimate Partner Violence: Not on file    FAMILY HISTORY: Family History  Problem Relation Age of Onset   Tuberculosis Mother    Heart disease Father     ALLERGIES:  is allergic to amlodipine besylate, atorvastatin, isosorbide dinitrate, and rosuvastatin.  MEDICATIONS:  Current Outpatient Medications  Medication Sig Dispense Refill   iron polysaccharides (NIFEREX) 150 MG capsule Take 1 capsule (150 mg total) by mouth daily. 30 capsule 4   albuterol (PROVENTIL HFA;VENTOLIN HFA) 108 (90 Base) MCG/ACT inhaler Inhale 2 puffs into the lungs as needed.     aspirin (ASPIRIN CHILDRENS) 81 MG chewable tablet Chew 1 tablet (81 mg total) by mouth daily.     B Complex-Biotin-FA (SUPER B-50 COMPLEX PO) Take 1 capsule by mouth.     budesonide-formoterol (SYMBICORT) 160-4.5 MCG/ACT inhaler Inhale into the lungs.     Cholecalciferol 50 MCG (2000 UT) CAPS Take by mouth.     cyanocobalamin 2000 MCG tablet Take 2,000 mcg by mouth.      hydrALAZINE (APRESOLINE) 50 MG tablet TAKE 1 TABLET(50 MG) BY MOUTH THREE TIMES DAILY (Patient taking  differently: Taking it BID) 270 tablet 0   hydrALAZINE (APRESOLINE) 50 MG tablet Take by mouth.     latanoprost (XALATAN) 0.005 % ophthalmic solution Place 1 drop into both eyes at bedtime.     levothyroxine (SYNTHROID) 100 MCG tablet Take 100 mcg by mouth daily before breakfast.     olmesartan-hydrochlorothiazide (BENICAR HCT) 40-12.5 MG tablet Take 1 tablet by mouth daily. (Patient taking differently: Take 1 tablet by mouth daily. Taking in the morning) 90 tablet 2   rosuvastatin (CRESTOR) 20 MG tablet Take 1 tablet by mouth at bedtime.     No current facility-administered medications for this visit.    REVIEW OF SYSTEMS:   .10 Point review of Systems was done is negative except as noted above.   PHYSICAL EXAMINATION:  . Vitals:   04/03/21 1109  BP: (!) 199/82  Pulse: 76  Resp: 16  Temp: 98.2  F (36.8 C)  SpO2: 100%   Filed Weights   04/03/21 1109  Weight: 216 lb (98 kg)   .Body mass index is 35.94 kg/m.  Exam was given in a chair.   GENERAL:alert, in no acute distress and comfortable SKIN: no acute rashes, no significant lesions EYES: conjunctiva are pink and non-injected, sclera anicteric OROPHARYNX: MMM, no exudates, no oropharyngeal erythema or ulceration NECK: supple, no JVD LYMPH:  no palpable lymphadenopathy in the cervical, axillary or inguinal regions LUNGS: clear to auscultation b/l with normal respiratory effort HEART: regular rate & rhythm ABDOMEN:  normoactive bowel sounds , non tender, not distended. Extremity: no pedal edema PSYCH: alert & oriented x 3 with fluent speech NEURO: no focal motor/sensory deficits  LABORATORY DATA:  I have reviewed the data as listed  . CBC Latest Ref Rng & Units 03/27/2021 09/19/2020 07/31/2020  WBC 4.0 - 10.5 K/uL 5.8 6.3 6.0  Hemoglobin 12.0 - 15.0 g/dL 10.9(L) 11.4(L) 11.6  Hematocrit 36.0 - 46.0 % 32.9(L) 35.5(L) 36.6  Platelets 150 - 400 K/uL 272 274 307    . CMP Latest Ref Rng & Units 03/27/2021 09/19/2020  07/31/2020  Glucose 70 - 99 mg/dL 94 94 92  BUN 8 - 23 mg/dL $Remove'19 16 16  'hSTWIbO$ Creatinine 0.44 - 1.00 mg/dL 1.36(H) 1.26(H) 1.25(H)  Sodium 135 - 145 mmol/L 142 139 141  Potassium 3.5 - 5.1 mmol/L 4.1 4.5 4.6  Chloride 98 - 111 mmol/L 108 107 106  CO2 22 - 32 mmol/L $RemoveB'26 26 24  'rpGnLKAu$ Calcium 8.9 - 10.3 mg/dL 9.9 9.5 9.7  Total Protein 6.5 - 8.1 g/dL 7.5 7.7 -  Total Bilirubin 0.3 - 1.2 mg/dL 0.4 0.4 -  Alkaline Phos 38 - 126 U/L 62 64 -  AST 15 - 41 U/L 23 25 -  ALT 0 - 44 U/L 17 19 -   . Lab Results  Component Value Date   IRON 48 04/03/2021   TIBC 342 04/03/2021   IRONPCTSAT 14 04/03/2021   (Iron and TIBC)  Lab Results  Component Value Date   FERRITIN 34 04/03/2021    RADIOGRAPHIC STUDIES: I have personally reviewed the radiological images as listed and agreed with the findings in the report. No results found.  ASSESSMENT & PLAN:   85 y.o. female with  1. MGUS- Monoclonal Gammopathy or undetermined significance. 01/03/17 Bone Survey revealed No definite lytic lesions within the axial skeleton. 2. Mixed lytic and blastic lesion posteriorly at C7. CT or MRI could be used for further evaluation of the cervical spine. 3. 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 No discrete lytic lesions are present.  02/21/17 CT Cervical Spine revealed CT scan does not show a convincing abnormality within the C7 vertebral body. However, within the posterior C3 vertebral body, there is a well-circumscribed lucent area measuring 8 x 4 x 5 mm which, though not specific or conclusive, could represent a focus of myeloma.  04/24/18 MRI Lumbar Spine revealed degenerative disc disease, multilevel disc bulges and disc osteophytes and advised that the pt speak with her PCP regarding 2.9cm cystic lesion in right adnexa  Labs from initial presentation from 04/21/18: M Protein at 0.7g  2. Pernicious anemia PLAN: -Lab results 03/27/2021 CBC shows mild anemia  with a hemoglobin of 10.9 but otherwise unremarkable. CMP unremarkable other than mild chronic kidney disease with a creatinine of 1.36 which is stable B12 levels adequately replaced at this time. Myeloma panel shows stable M protein  and 0.8 g/dL which is unchanged over the last 6 months. Ferritin level of 34 with an iron saturation of 14%.  -The pt shows no overt clinical or lab transformation of her MGUS at this time. -.Will continue to monitor pt with labs and clinical every 6 months for progression to Multiple Myeloma. -Continue SL 1000 mcg Vitamin B12 and one tablet B-complex daily.  -Would recommend starting iron polysaccharide 150 mg p.o. daily for low iron levels. -Reccommended pt take 2000 IU Vitamin D daily. -Continue to optimize Synthroid dose with primary care physician -Will see back in 6 months with labs.   2. HTN -chronically uncontrolled . Element of white coat hypertension. Patient notes BP much better at home. No CP/SOB or headaches. Rpt BP in clinic 170/84  PLAN -continue compliance with anti HTN -continue f/u with PCP to optimize BP control   FOLLOW UP: Labs today RTC with Dr Irene Limbo in 6 months, labs 1 week prior to appointment  . The total time spent in the appointment was 23 minutes and more than 50% was on counseling and direct patient cares.  All of the patient's questions were answered with apparent satisfaction. The patient knows to call the clinic with any problems, questions or concerns.    Sullivan Lone MD [place] AAHIVMS [facility] Hematology/Oncology Physician [facility]

## 2021-04-10 ENCOUNTER — Telehealth: Payer: Self-pay

## 2021-04-10 NOTE — Telephone Encounter (Signed)
Contacted pt earlier to  let patient know that she is iron deficient she will start taking iron polysaccharide 150 mg p.o. daily over-the-counter.  Dr Irene Limbo sent in a prescription in case insurance covers this.  Pt verified that she has picked up prescription and is taking iron.

## 2021-06-04 ENCOUNTER — Other Ambulatory Visit: Payer: Self-pay

## 2021-06-04 ENCOUNTER — Ambulatory Visit (INDEPENDENT_AMBULATORY_CARE_PROVIDER_SITE_OTHER): Payer: Medicare Other | Admitting: Podiatry

## 2021-06-04 ENCOUNTER — Encounter: Payer: Self-pay | Admitting: Podiatry

## 2021-06-04 DIAGNOSIS — M79674 Pain in right toe(s): Secondary | ICD-10-CM

## 2021-06-04 DIAGNOSIS — I739 Peripheral vascular disease, unspecified: Secondary | ICD-10-CM

## 2021-06-04 DIAGNOSIS — N183 Chronic kidney disease, stage 3 unspecified: Secondary | ICD-10-CM

## 2021-06-04 DIAGNOSIS — M79675 Pain in left toe(s): Secondary | ICD-10-CM

## 2021-06-04 DIAGNOSIS — B351 Tinea unguium: Secondary | ICD-10-CM | POA: Diagnosis not present

## 2021-06-04 DIAGNOSIS — D689 Coagulation defect, unspecified: Secondary | ICD-10-CM | POA: Diagnosis not present

## 2021-06-04 NOTE — Progress Notes (Signed)
This patient returns to my office for at risk foot care.  This patient requires this care by a professional since this patient will be at risk due to having chronic kidney disease and PAD.Marland Kitchen  This patient is unable to cut nails herself since the patient cannot reach her nails.These nails are painful walking and wearing shoes.  This patient presents for at risk foot care today.  Patient requests the medial border right great toe not be trimmed.  General Appearance  Alert, conversant and in no acute stress.  Vascular  Dorsalis pedis and posterior tibial  pulses are palpable  bilaterally.  Capillary return is within normal limits  bilaterally. Temperature is within normal limits  bilaterally.  Neurologic  Senn-Weinstein monofilament wire test within normal limits  bilaterally. Muscle power within normal limits bilaterally.  Nails Thick disfigured discolored nails with subungual debris  from hallux to fifth toes bilaterally. No evidence of bacterial infection or drainage bilaterally.  Orthopedic  No limitations of motion  feet .  No crepitus or effusions noted.  No bony pathology or digital deformities noted.  HAV  B/L.  Skin  normotropic skin with no porokeratosis noted bilaterally.  No signs of infections or ulcers noted.   Asymptomatic HD 5th B/L.  Onychomycosis  Pain in right toes  Pain in left toes  Consent was obtained for treatment procedures.   Mechanical debridement of nails 1-5  bilaterally performed with a nail nipper.  Filed with dremel without incident.    Return office visit   3 months                   Told patient to return for periodic foot care and evaluation due to potential at risk complications.   Gardiner Barefoot DPM

## 2021-09-07 ENCOUNTER — Encounter: Payer: Self-pay | Admitting: Podiatry

## 2021-09-07 ENCOUNTER — Other Ambulatory Visit: Payer: Self-pay

## 2021-09-07 ENCOUNTER — Ambulatory Visit (INDEPENDENT_AMBULATORY_CARE_PROVIDER_SITE_OTHER): Payer: Medicare Other | Admitting: Podiatry

## 2021-09-07 DIAGNOSIS — D689 Coagulation defect, unspecified: Secondary | ICD-10-CM | POA: Diagnosis not present

## 2021-09-07 DIAGNOSIS — B351 Tinea unguium: Secondary | ICD-10-CM | POA: Diagnosis not present

## 2021-09-07 DIAGNOSIS — M79674 Pain in right toe(s): Secondary | ICD-10-CM

## 2021-09-07 DIAGNOSIS — M79675 Pain in left toe(s): Secondary | ICD-10-CM

## 2021-09-07 DIAGNOSIS — N183 Chronic kidney disease, stage 3 unspecified: Secondary | ICD-10-CM

## 2021-09-07 DIAGNOSIS — I739 Peripheral vascular disease, unspecified: Secondary | ICD-10-CM | POA: Diagnosis not present

## 2021-09-07 NOTE — Progress Notes (Signed)
This patient returns to my office for at risk foot care.  This patient requires this care by a professional since this patient will be at risk due to having chronic kidney disease and PAD.Marland Kitchen  This patient is unable to cut nails herself since the patient cannot reach her nails.These nails are painful walking and wearing shoes.  This patient presents for at risk foot care today.  Patient requests the medial border right great toe not be trimmed.  General Appearance  Alert, conversant and in no acute stress.  Vascular  Dorsalis pedis and posterior tibial  pulses are palpable  bilaterally.  Capillary return is within normal limits  bilaterally. Temperature is within normal limits  bilaterally.  Neurologic  Senn-Weinstein monofilament wire test within normal limits  bilaterally. Muscle power within normal limits bilaterally.  Nails Thick disfigured discolored nails with subungual debris  from hallux to fifth toes bilaterally. No evidence of bacterial infection or drainage bilaterally.  Orthopedic  No limitations of motion  feet .  No crepitus or effusions noted.  No bony pathology or digital deformities noted.  HAV  B/L.  Skin  normotropic skin with no porokeratosis noted bilaterally.  No signs of infections or ulcers noted.   Asymptomatic HD 5th B/L.  Onychomycosis  Pain in right toes  Pain in left toes  Consent was obtained for treatment procedures.   Mechanical debridement of nails 1-5  bilaterally performed with a nail nipper.  Filed with dremel without incident.    Return office visit   3 months                   Told patient to return for periodic foot care and evaluation due to potential at risk complications.   Gardiner Barefoot DPM

## 2021-09-13 NOTE — Progress Notes (Signed)
Labs 08/03/2021: Vitamin D 37.7. Serum glucose 90 mg, BUN 16, creatinine 1.19, EGFR 45 mill, potassium 4.9, LFTs normal.  Magnesium 1.8.

## 2021-09-21 ENCOUNTER — Other Ambulatory Visit: Payer: Self-pay

## 2021-09-21 DIAGNOSIS — D472 Monoclonal gammopathy: Secondary | ICD-10-CM

## 2021-09-21 DIAGNOSIS — D510 Vitamin B12 deficiency anemia due to intrinsic factor deficiency: Secondary | ICD-10-CM

## 2021-09-24 ENCOUNTER — Inpatient Hospital Stay: Payer: Medicare Other | Attending: Hematology and Oncology

## 2021-09-24 ENCOUNTER — Other Ambulatory Visit: Payer: Self-pay

## 2021-09-24 DIAGNOSIS — I10 Essential (primary) hypertension: Secondary | ICD-10-CM | POA: Diagnosis not present

## 2021-09-24 DIAGNOSIS — D472 Monoclonal gammopathy: Secondary | ICD-10-CM | POA: Insufficient documentation

## 2021-09-24 DIAGNOSIS — Z87891 Personal history of nicotine dependence: Secondary | ICD-10-CM | POA: Diagnosis not present

## 2021-09-24 DIAGNOSIS — D510 Vitamin B12 deficiency anemia due to intrinsic factor deficiency: Secondary | ICD-10-CM | POA: Insufficient documentation

## 2021-09-24 LAB — CBC WITH DIFFERENTIAL (CANCER CENTER ONLY)
Abs Immature Granulocytes: 0.01 10*3/uL (ref 0.00–0.07)
Basophils Absolute: 0 10*3/uL (ref 0.0–0.1)
Basophils Relative: 1 %
Eosinophils Absolute: 0.3 10*3/uL (ref 0.0–0.5)
Eosinophils Relative: 4 %
HCT: 35.2 % — ABNORMAL LOW (ref 36.0–46.0)
Hemoglobin: 11.2 g/dL — ABNORMAL LOW (ref 12.0–15.0)
Immature Granulocytes: 0 %
Lymphocytes Relative: 46 %
Lymphs Abs: 2.9 10*3/uL (ref 0.7–4.0)
MCH: 28.2 pg (ref 26.0–34.0)
MCHC: 31.8 g/dL (ref 30.0–36.0)
MCV: 88.7 fL (ref 80.0–100.0)
Monocytes Absolute: 0.5 10*3/uL (ref 0.1–1.0)
Monocytes Relative: 8 %
Neutro Abs: 2.6 10*3/uL (ref 1.7–7.7)
Neutrophils Relative %: 41 %
Platelet Count: 278 10*3/uL (ref 150–400)
RBC: 3.97 MIL/uL (ref 3.87–5.11)
RDW: 14.4 % (ref 11.5–15.5)
WBC Count: 6.4 10*3/uL (ref 4.0–10.5)
nRBC: 0 % (ref 0.0–0.2)

## 2021-09-24 LAB — CMP (CANCER CENTER ONLY)
ALT: 15 U/L (ref 0–44)
AST: 21 U/L (ref 15–41)
Albumin: 4 g/dL (ref 3.5–5.0)
Alkaline Phosphatase: 61 U/L (ref 38–126)
Anion gap: 4 — ABNORMAL LOW (ref 5–15)
BUN: 17 mg/dL (ref 8–23)
CO2: 29 mmol/L (ref 22–32)
Calcium: 9.7 mg/dL (ref 8.9–10.3)
Chloride: 107 mmol/L (ref 98–111)
Creatinine: 1.35 mg/dL — ABNORMAL HIGH (ref 0.44–1.00)
GFR, Estimated: 39 mL/min — ABNORMAL LOW (ref >60)
Glucose, Bld: 99 mg/dL (ref 70–99)
Potassium: 3.7 mmol/L (ref 3.5–5.1)
Sodium: 140 mmol/L (ref 135–145)
Total Bilirubin: 0.4 mg/dL (ref 0.3–1.2)
Total Protein: 7.5 g/dL (ref 6.5–8.1)

## 2021-09-24 LAB — IRON AND IRON BINDING CAPACITY (CC-WL,HP ONLY)
Iron: 59 ug/dL (ref 28–170)
Saturation Ratios: 18 % (ref 10.4–31.8)
TIBC: 332 ug/dL (ref 250–450)
UIBC: 273 ug/dL (ref 148–442)

## 2021-09-24 LAB — VITAMIN B12: Vitamin B-12: 743 pg/mL (ref 180–914)

## 2021-09-24 LAB — FERRITIN: Ferritin: 31 ng/mL (ref 11–307)

## 2021-09-25 LAB — KAPPA/LAMBDA LIGHT CHAINS
Kappa free light chain: 36.1 mg/L — ABNORMAL HIGH (ref 3.3–19.4)
Kappa, lambda light chain ratio: 3.57 — ABNORMAL HIGH (ref 0.26–1.65)
Lambda free light chains: 10.1 mg/L (ref 5.7–26.3)

## 2021-09-27 LAB — MULTIPLE MYELOMA PANEL, SERUM
Albumin SerPl Elph-Mcnc: 3.4 g/dL (ref 2.9–4.4)
Albumin/Glob SerPl: 1 (ref 0.7–1.7)
Alpha 1: 0.2 g/dL (ref 0.0–0.4)
Alpha2 Glob SerPl Elph-Mcnc: 0.8 g/dL (ref 0.4–1.0)
B-Globulin SerPl Elph-Mcnc: 1.1 g/dL (ref 0.7–1.3)
Gamma Glob SerPl Elph-Mcnc: 1.4 g/dL (ref 0.4–1.8)
Globulin, Total: 3.5 g/dL (ref 2.2–3.9)
IgA: 165 mg/dL (ref 64–422)
IgG (Immunoglobin G), Serum: 1506 mg/dL (ref 586–1602)
IgM (Immunoglobulin M), Srm: 52 mg/dL (ref 26–217)
M Protein SerPl Elph-Mcnc: 0.9 g/dL — ABNORMAL HIGH
Total Protein ELP: 6.9 g/dL (ref 6.0–8.5)

## 2021-10-01 ENCOUNTER — Inpatient Hospital Stay (HOSPITAL_BASED_OUTPATIENT_CLINIC_OR_DEPARTMENT_OTHER): Payer: Medicare Other | Admitting: Hematology

## 2021-10-01 ENCOUNTER — Other Ambulatory Visit: Payer: Self-pay

## 2021-10-01 VITALS — BP 185/77 | HR 84 | Temp 97.5°F | Resp 18 | Wt 220.6 lb

## 2021-10-01 DIAGNOSIS — D510 Vitamin B12 deficiency anemia due to intrinsic factor deficiency: Secondary | ICD-10-CM | POA: Diagnosis not present

## 2021-10-01 DIAGNOSIS — D472 Monoclonal gammopathy: Secondary | ICD-10-CM | POA: Diagnosis not present

## 2021-10-01 MED ORDER — POLYSACCHARIDE IRON COMPLEX 150 MG PO CAPS: 150.0000 mg | ORAL_CAPSULE | Freq: Every day | ORAL | 3 refills | Status: DC

## 2021-10-02 ENCOUNTER — Other Ambulatory Visit: Payer: Self-pay

## 2021-10-02 DIAGNOSIS — D472 Monoclonal gammopathy: Secondary | ICD-10-CM

## 2021-10-02 MED ORDER — POLYSACCHARIDE IRON COMPLEX 150 MG PO CAPS: 150.0000 mg | ORAL_CAPSULE | Freq: Every day | ORAL | 3 refills | Status: DC

## 2021-10-07 NOTE — Progress Notes (Signed)
? ? ?HEMATOLOGY/ONCOLOGY CLINIC NOTE ? ?Date of Service: 10/01/2021 ? ? ?Patient Care Team: ?Chesley Noon, MD as PCP - General (Family Medicine) ? ?CHIEF COMPLAINTS/PURPOSE OF CONSULTATION:  ?Follow-up for MGUS and management of pernicious anemia ? ?HISTORY OF PRESENTING ILLNESS:  ?Brenda Barry is a wonderful 86 y.o. female who has been referred to [place] by Dr. Anastasia Pall for evaluation and management of Monoclonal Paraproteinemia. The pt reports that she is doing well overall.  ? ?The pt reports that she has had sciatica in the last year with referred pain down her leg, caused by a pinched nerve at L4/5.  The pt is intending to see Dr. Leonel Ramsay in neurosurgery soon. The pt's lower back pain has been evaluated with an MRI most recently on 04/24/18. The pt is also being evaluated by cardiology. ? ?The pt denies any neck pain whatsoever and denies any new bone pains.  ? ?The pt notes that her kidney functions have been stable recently and has been pursuing care with Dr. Erling Cruz at [facility]. The pt notes that her blood pressure has been high, and is elevated more when she sees physicians. She has taken HCTZ, Toprol-XL, and Hydralazine. The pt notes that her thyroid medication has recently been adjusted.  ? ?Of note prior to the patient's presentation, the pt had a Bone Survey on 01/03/17 which revealed No definite lytic lesions within the axial skeleton. 2. Mixed lytic and blastic lesion posteriorly at C7. CT or MRI could be used for further evaluation of the cervical spine. 3. 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 No discrete lytic lesions are present.  ? ?Subsequently the pt then had a CT Cervical Spine on 02/21/17 which revealed CT scan does not show a convincing abnormality within the C7 vertebral body. However, within the posterior C3 vertebral body, there is a well-circumscribed lucent area measuring 8 x 4 x 5 mm which,  though not specific or conclusive, could represent a focus of myeloma. ? ?Most recent lab results (04/21/18) of CMP is as follows: all values are WNL except for Glucose at 104, Creatinine at 1.07, GFR at 57, BUN at 10, Sodium at 145, Phosphorous at 2.2. ?04/21/18 SPEP revealed all values WNL except for M-spike at 0.7, with IgG monoclonal protein with kappa light chain specificity.  ? ?On review of systems, pt reports sciatica, stable energy levels, stable lower back pain, and denies any neck pain, new bone pains, abdominal pains, chest wall pain, and any other symptoms.  ? ?On PMHx the pt reports Osteoporosis, Hypothyroidism, Hyperlipidemia, CAD with 5p stent place in late 90s, benign lumpectomy of right breast, HTN, IgG monoclonal gammopathy, CKD stage III. ? ? ?Interval History:  ? ?Brenda Barry is here for her 60-monthfollow-up for continued evaluation and management of MGUS and pernicious anemia . ? ?Patient notes no acute new concerns or symptoms.  Stable chronic neuropathy. ?Notes that she continues to be compliant with her vitamin B12. ? ?No acute new symptoms.  No fevers no chills no night sweats. ?No new focal bone pains. ?No sudden weight loss. ? ?Lab results from 09/24/2021 reviewed in detail. ? ?MEDICAL HISTORY:  ?HTN ?HLD ?Anxiety ?Sciatica ? ?SURGICAL HISTORY: ?Past Surgical History:  ?Procedure Laterality Date  ? HIP SURGERY Right 10/05/2018  ? ? ?SOCIAL HISTORY: ?Social History  ? ?Socioeconomic History  ? Marital status: Widowed  ?  Spouse name: Not on file  ? Number of children: 3  ?  Years of education: Not on file  ? Highest education level: Not on file  ?Occupational History  ? Not on file  ?Tobacco Use  ? Smoking status: Former  ?  Packs/day: 0.25  ?  Years: 10.00  ?  Pack years: 2.50  ?  Types: Cigarettes  ?  Quit date: 04/19/1971  ?  Years since quitting: 50.5  ? Smokeless tobacco: Never  ?Vaping Use  ? Vaping Use: Never used  ?Substance and Sexual Activity  ? Alcohol use: No  ? Drug use: No  ?  Sexual activity: Not on file  ?Other Topics Concern  ? Not on file  ?Social History Narrative  ? Not on file  ? ?Social Determinants of Health  ? ?Financial Resource Strain: Not on file  ?Food Insecurity: Not on file  ?Transportation Needs: Not on file  ?Physical Activity: Not on file  ?Stress: Not on file  ?Social Connections: Not on file  ?Intimate Partner Violence: Not on file  ? ? ?FAMILY HISTORY: ?Family History  ?Problem Relation Age of Onset  ? Tuberculosis Mother   ? Heart disease Father   ? ? ?ALLERGIES:  is allergic to amlodipine besylate, atorvastatin, isosorbide dinitrate, and rosuvastatin. ? ?MEDICATIONS:  ?Current Outpatient Medications  ?Medication Sig Dispense Refill  ? albuterol (PROVENTIL HFA;VENTOLIN HFA) 108 (90 Base) MCG/ACT inhaler Inhale 2 puffs into the lungs as needed.    ? aspirin (ASPIRIN CHILDRENS) 81 MG chewable tablet Chew 1 tablet (81 mg total) by mouth daily.    ? B Complex-Biotin-FA (SUPER B-50 COMPLEX PO) Take 1 capsule by mouth.    ? budesonide-formoterol (SYMBICORT) 160-4.5 MCG/ACT inhaler Inhale into the lungs.    ? Cholecalciferol 50 MCG (2000 UT) CAPS Take by mouth.    ? cyanocobalamin 2000 MCG tablet Take 2,000 mcg by mouth.     ? ezetimibe (ZETIA) 10 MG tablet Take 10 mg by mouth daily.    ? hydrALAZINE (APRESOLINE) 50 MG tablet Take by mouth 2 (two) times daily.    ? latanoprost (XALATAN) 0.005 % ophthalmic solution Place 1 drop into both eyes at bedtime.    ? levothyroxine (SYNTHROID) 100 MCG tablet Take 100 mcg by mouth daily before breakfast.    ? olmesartan-hydrochlorothiazide (BENICAR HCT) 40-12.5 MG tablet Take 1 tablet by mouth daily. (Patient taking differently: Take 1 tablet by mouth daily. Taking in the morning) 90 tablet 2  ? iron polysaccharides (NIFEREX) 150 MG capsule Take 1 capsule (150 mg total) by mouth daily. 90 capsule 3  ? ?No current facility-administered medications for this visit.  ? ? ?REVIEW OF SYSTEMS:   ?10 Point review of Systems was done is  negative except as noted above. ? ? ?PHYSICAL EXAMINATION: ? ?. ?Vitals:  ? 10/01/21 1034  ?BP: (!) 185/77  ?Pulse: 84  ?Resp: 18  ?Temp: (!) 97.5 ?F (36.4 ?C)  ?SpO2: 100%  ? ?Filed Weights  ? 10/01/21 1034  ?Weight: 220 lb 9 oz (100 kg)  ? ?.Body mass index is 36.7 kg/m?. ? ?NAD ?GENERAL:alert, in no acute distress and comfortable ?SKIN: no acute rashes, no significant lesions ?EYES: conjunctiva are pink and non-injected, sclera anicteric ?OROPHARYNX: MMM, no exudates, no oropharyngeal erythema or ulceration ?NECK: supple, no JVD ?LYMPH:  no palpable lymphadenopathy in the cervical, axillary or inguinal regions ?LUNGS: clear to auscultation b/l with normal respiratory effort ?HEART: regular rate & rhythm ?ABDOMEN:  normoactive bowel sounds , non tender, not distended. ?Extremity: no pedal edema ?PSYCH: alert & oriented x 3  with fluent speech ?NEURO: no focal motor/sensory deficits ? ? ?LABORATORY DATA:  ?I have reviewed the data as listed ? ?. ?CBC Latest Ref Rng & Units 09/24/2021 03/27/2021 09/19/2020  ?WBC 4.0 - 10.5 K/uL 6.4 5.8 6.3  ?Hemoglobin 12.0 - 15.0 g/dL 11.2(L) 10.9(L) 11.4(L)  ?Hematocrit 36.0 - 46.0 % 35.2(L) 32.9(L) 35.5(L)  ?Platelets 150 - 400 K/uL 278 272 274  ? ? ?. ?CMP Latest Ref Rng & Units 09/24/2021 03/27/2021 09/19/2020  ?Glucose 70 - 99 mg/dL 99 94 94  ?BUN 8 - 23 mg/dL '17 19 16  '$ ?Creatinine 0.44 - 1.00 mg/dL 1.35(H) 1.36(H) 1.26(H)  ?Sodium 135 - 145 mmol/L 140 142 139  ?Potassium 3.5 - 5.1 mmol/L 3.7 4.1 4.5  ?Chloride 98 - 111 mmol/L 107 108 107  ?CO2 22 - 32 mmol/L '29 26 26  '$ ?Calcium 8.9 - 10.3 mg/dL 9.7 9.9 9.5  ?Total Protein 6.5 - 8.1 g/dL 7.5 7.5 7.7  ?Total Bilirubin 0.3 - 1.2 mg/dL 0.4 0.4 0.4  ?Alkaline Phos 38 - 126 U/L 61 62 64  ?AST 15 - 41 U/L '21 23 25  '$ ?ALT 0 - 44 U/L '15 17 19  '$ ? ?. ?Lab Results  ?Component Value Date  ? IRON 59 09/24/2021  ? TIBC 332 09/24/2021  ? IRONPCTSAT 18 09/24/2021  ? ?(Iron and TIBC) ? ?Lab Results  ?Component Value Date  ? FERRITIN 31 09/24/2021  ? ?B12  -743 ? ? ? ?RADIOGRAPHIC STUDIES: ?I have personally reviewed the radiological images as listed and agreed with the findings in the report. ?No results found. ? ?ASSESSMENT & PLAN:  ? ?86 y.o. female with ?

## 2021-12-12 ENCOUNTER — Ambulatory Visit (INDEPENDENT_AMBULATORY_CARE_PROVIDER_SITE_OTHER): Payer: Medicare Other | Admitting: Podiatry

## 2021-12-12 ENCOUNTER — Encounter: Payer: Self-pay | Admitting: Podiatry

## 2021-12-12 DIAGNOSIS — D689 Coagulation defect, unspecified: Secondary | ICD-10-CM | POA: Diagnosis not present

## 2021-12-12 DIAGNOSIS — I739 Peripheral vascular disease, unspecified: Secondary | ICD-10-CM

## 2021-12-12 DIAGNOSIS — B351 Tinea unguium: Secondary | ICD-10-CM

## 2021-12-12 DIAGNOSIS — M79675 Pain in left toe(s): Secondary | ICD-10-CM

## 2021-12-12 DIAGNOSIS — N183 Chronic kidney disease, stage 3 unspecified: Secondary | ICD-10-CM

## 2021-12-12 DIAGNOSIS — M79674 Pain in right toe(s): Secondary | ICD-10-CM

## 2021-12-12 NOTE — Progress Notes (Signed)
This patient returns to my office for at risk foot care.  This patient requires this care by a professional since this patient will be at risk due to having chronic kidney disease and PAD..  This patient is unable to cut nails herself since the patient cannot reach her nails.These nails are painful walking and wearing shoes.  This patient presents for at risk foot care today.    General Appearance  Alert, conversant and in no acute stress.  Vascular  Dorsalis pedis and posterior tibial  pulses are palpable  bilaterally.  Capillary return is within normal limits  bilaterally. Temperature is within normal limits  bilaterally.  Neurologic  Senn-Weinstein monofilament wire test within normal limits  bilaterally. Muscle power within normal limits bilaterally.  Nails Thick disfigured discolored nails with subungual debris  from hallux to fifth toes bilaterally. No evidence of bacterial infection or drainage bilaterally.  Orthopedic  No limitations of motion  feet .  No crepitus or effusions noted.  No bony pathology or digital deformities noted.  HAV  B/L.  Skin  normotropic skin with no porokeratosis noted bilaterally.  No signs of infections or ulcers noted.   Asymptomatic HD 5th B/L.  Onychomycosis  Pain in right toes  Pain in left toes  Consent was obtained for treatment procedures.   Mechanical debridement of nails 1-5  bilaterally performed with a nail nipper.  Filed with dremel without incident.    Return office visit   3 months                   Told patient to return for periodic foot care and evaluation due to potential at risk complications.   Esma Kilts DPM  

## 2021-12-20 ENCOUNTER — Ambulatory Visit: Payer: Medicare Other

## 2021-12-20 DIAGNOSIS — I6523 Occlusion and stenosis of bilateral carotid arteries: Secondary | ICD-10-CM

## 2022-01-10 ENCOUNTER — Encounter: Payer: Self-pay | Admitting: Cardiology

## 2022-01-10 ENCOUNTER — Ambulatory Visit: Payer: Medicare Other | Admitting: Cardiology

## 2022-01-10 VITALS — BP 135/90 | HR 75 | Temp 97.7°F | Resp 16 | Ht 65.0 in | Wt 220.0 lb

## 2022-01-10 DIAGNOSIS — I739 Peripheral vascular disease, unspecified: Secondary | ICD-10-CM

## 2022-01-10 DIAGNOSIS — I6523 Occlusion and stenosis of bilateral carotid arteries: Secondary | ICD-10-CM

## 2022-01-10 DIAGNOSIS — I2510 Atherosclerotic heart disease of native coronary artery without angina pectoris: Secondary | ICD-10-CM

## 2022-01-10 DIAGNOSIS — I10 Essential (primary) hypertension: Secondary | ICD-10-CM

## 2022-01-10 NOTE — Progress Notes (Signed)
Primary Physician/Referring:  Chesley Noon, MD  Patient ID: Brenda Barry, female    DOB: October 04, 1935, 86 y.o.   MRN: [id number]  Chief Complaint  Patient presents with   Coronary Artery Disease   Carotid disease   HPI:    Brenda Barry  is a 86 y.o.  African-American female with known coronary artery disease with remote coronary stenting 2001 and history of brachytherapy due to restenosis, difficult to control hypertension, asymptomatic bilateral carotid artery disease, PAD with both neurogenic claudication and vascular claudication, she has known right SFA occlusion, being managed medically.  Also has stage III chronic kidney disease due to hypertension, hyperlipidemia.  She is presently doing well and is annual office visit.  States that her blood pressure has been well controlled at home, dyspnea has remained stable, she has not had any chest pain and occasional mild cramping in her legs but has not been lifestyle limiting.  Especially involving the right foot, due to cramping at night, Dr. Melford Aase had stopped Crestor, she has noticed improvement in leg cramps.  Otherwise no chest pain, denies dyspnea, no leg edema, no palpitations.  Past Medical History:  Diagnosis Date   Bilateral carotid bruits 02/12/2019   Coronary artery disease    Hyperlipidemia    Hypertension    Peripheral artery disease ([place]) 02/12/2019   Past Surgical History:  Procedure Laterality Date   HIP SURGERY Right 10/05/2018   Family History  Problem Relation Age of Onset   Tuberculosis Mother    Heart disease Father     Social History   Tobacco Use   Smoking status: Former    Packs/day: 0.25    Years: 10.00    Total pack years: 2.50    Types: Cigarettes    Quit date: 04/19/1971    Years since quitting: 50.7   Smokeless tobacco: Never  Substance Use Topics   Alcohol use: No   Marital Status: Widowed ROS  Review of Systems  Cardiovascular:  Negative for chest pain, dyspnea on exertion  and leg swelling.   Objective  Blood pressure 135/90, pulse 75, temperature 97.7 F (36.5 C), temperature source Temporal, resp. rate 16, height '5\' 5"'$  (1.651 m), weight 220 lb (99.8 kg), SpO2 99 %.     01/10/2022   10:37 AM 01/10/2022   10:26 AM 10/01/2021   10:34 AM  Vitals with BMI  Height  '5\' 5"'$    Weight  220 lbs 220 lbs 9 oz  BMI  08.67   Systolic 619 509 326  Diastolic 90 80 77  Pulse 75 80 84     Physical Exam Constitutional:      Comments: Moderately obese patient in no distress  Neck:     Vascular: No JVD.  Cardiovascular:     Rate and Rhythm: Normal rate and regular rhythm. Extrasystoles are present.    Pulses: Intact distal pulses.          Carotid pulses are  on the left side with bruit.      Femoral pulses are 2+ on the right side and 2+ on the left side.      Popliteal pulses are 1+ on the right side and 1+ on the left side.       Dorsalis pedis pulses are 2+ on the right side and 2+ on the left side.       Posterior tibial pulses are 0 on the right side and 0 on the left side.     Heart  sounds: S1 normal and S2 normal. Murmur heard.     Harsh early systolic murmur is present with a grade of 2/6 at the upper right sternal border radiating to the neck.     No gallop.  Pulmonary:     Effort: Pulmonary effort is normal.     Breath sounds: Normal breath sounds.  Abdominal:     General: Bowel sounds are normal.     Palpations: Abdomen is soft.     Comments: obese  Musculoskeletal:     Right lower leg: No edema.     Left lower leg: No edema.    Laboratory examination:   Recent Labs    03/27/21 1014 09/24/21 0955  NA 142 140  K 4.1 3.7  CL 108 107  CO2 26 29  GLUCOSE 94 99  BUN 19 17  CREATININE 1.36* 1.35*  CALCIUM 9.9 9.7  GFRNONAA 39* 39*   CrCl cannot be calculated (Patient's most recent lab result is older than the maximum 21 days allowed.).     Latest Ref Rng & Units 09/24/2021    9:55 AM 03/27/2021   10:14 AM 09/19/2020    9:54 AM  CMP  Glucose  70 - 99 mg/dL 99  94  94   BUN 8 - 23 mg/dL '17  19  16   '$ Creatinine 0.44 - 1.00 mg/dL 1.35  1.36  1.26   Sodium 135 - 145 mmol/L 140  142  139   Potassium 3.5 - 5.1 mmol/L 3.7  4.1  4.5   Chloride 98 - 111 mmol/L 107  108  107   CO2 22 - 32 mmol/L '29  26  26   '$ Calcium 8.9 - 10.3 mg/dL 9.7  9.9  9.5   Total Protein 6.5 - 8.1 g/dL 7.5  7.5  7.7   Total Bilirubin 0.3 - 1.2 mg/dL 0.4  0.4  0.4   Alkaline Phos 38 - 126 U/L 61  62  64   AST 15 - 41 U/L '21  23  25   '$ ALT 0 - 44 U/L '15  17  19       '$ Latest Ref Rng & Units 09/24/2021    9:55 AM 03/27/2021   10:14 AM 09/19/2020    9:54 AM  CBC  WBC 4.0 - 10.5 K/uL 6.4  5.8  6.3   Hemoglobin 12.0 - 15.0 g/dL 11.2  10.9  11.4   Hematocrit 36.0 - 46.0 % 35.2  32.9  35.5   Platelets 150 - 400 K/uL 278  272  274     External labs:   Labs 08/02/2021:  TSH normal at 1.080.  Total cholesterol 177, triglycerides 71, HDL 81, LDL 83.  Non-HDL cholesterol 96.  Hb 11.6/HCT 35.4, platelets 284.  Medications and allergies   Allergies  Allergen Reactions   Amlodipine Besylate Other (See Comments)    Nightmares and nerve pain Nightmares and nerve pain   Atorvastatin Other (See Comments)    nightmare nightmare   Isosorbide Dinitrate Other (See Comments)    Nerve pain, lower back pain, feels out of it Nerve pain, lower back pain, feels out of it   Rosuvastatin Other (See Comments)    Nightmare.  Hence takes it during day    Current Outpatient Medications:    albuterol (PROVENTIL HFA;VENTOLIN HFA) 108 (90 Base) MCG/ACT inhaler, Inhale 2 puffs into the lungs as needed., Disp: , Rfl:    aspirin (ASPIRIN CHILDRENS) 81 MG chewable tablet, Chew 1 tablet (81  mg total) by mouth daily., Disp: , Rfl:    B Complex-Biotin-FA (SUPER B-50 COMPLEX PO), Take 1 capsule by mouth., Disp: , Rfl:    budesonide-formoterol (SYMBICORT) 160-4.5 MCG/ACT inhaler, Inhale into the lungs., Disp: , Rfl:    Cholecalciferol 50 MCG (2000 UT) CAPS, Take by mouth., Disp: , Rfl:     cyanocobalamin 2000 MCG tablet, Take 2,000 mcg by mouth. , Disp: , Rfl:    ezetimibe (ZETIA) 10 MG tablet, Take 10 mg by mouth daily., Disp: , Rfl:    hydrALAZINE (APRESOLINE) 50 MG tablet, Take by mouth 2 (two) times daily., Disp: , Rfl:    latanoprost (XALATAN) 0.005 % ophthalmic solution, Place 1 drop into both eyes at bedtime., Disp: , Rfl:    levothyroxine (SYNTHROID) 100 MCG tablet, Take 100 mcg by mouth daily before breakfast., Disp: , Rfl:    olmesartan-hydrochlorothiazide (BENICAR HCT) 40-12.5 MG tablet, Take 1 tablet by mouth daily. (Patient taking differently: Take 1 tablet by mouth daily. Taking in the morning), Disp: 90 tablet, Rfl: 2     Radiology:  No results found.  Cardiac Studies:   Coronary stent in ? 2001 and restenosis and brachytherapy following year, no details available in EPIC.  Lower extremity arterial duplex 05/15/2018: Right mid SFA is occluded with distal reconstitution. No hemodynamically significant stenoses are identified in the left lower extremity arterial system. This exam reveals normal perfusion of the lower extremity (ABI 1.00 bilaterally).  Renal artery duplex  10/27/2019:  No evidence of renal artery occlusive disease in either renal artery.  Renal length is within normal limits for both kidneys.  Normal abdominal aorta flow velocities noted.  Echocardiogram 07/07/2020:  Normal LV systolic function with EF 55%. Left ventricle cavity is normal in size. Normal global wall motion. Doppler evidence of grade I (impaired) diastolic dysfunction. Calculated EF 55%. Left atrial cavity is mildly dilated. Native trileaflet aortic valve with no regurgitation. Mild aortic valve leaflet calcification. Mildly restricted aortic valve leaflets. No evidence of aortic valve stenosis. Structurally normal mitral valve.  Mild (Grade I) mitral regurgitation. Structurally normal tricuspid valve.  Mild tricuspid regurgitation. No evidence of pulmonary  hypertension. Compared to the study done on 3/53/2992, grade 2 diastolic dysfunction not noted, moderate posterior MR is not appreciated. Tricuspid regurgitation previously was mild to moderate, now is mild with normal PA pressure, previously 32 mmHg.  Carotid artery duplex 12/19/2020: Stenosis in the right internal carotid artery (16-49%). Stenosis in the right external carotid artery (<50%). Stenosis in the left internal carotid artery (16-49%). Stenosis in the left external carotid artery (<50%). Antegrade right vertebral artery flow. Antegrade left vertebral artery flow. Compared to 06/01/2020, left ICA stenosis of >50%is now <50%. Follow up in one year is appropriate if clinically indicated.    EKG    EKG 01/10/2021: Normal sinus rhythm with rate of 74 bpm, left atrial enlargement, normal axis.  No evidence of ischemia.  No significant change from 01/12/2021, PACs not seen.  Assessment     ICD-10-CM   1. Coronary artery disease involving native coronary artery of native heart without angina pectoris  I25.10 EKG 12-Lead    2. Asymptomatic bilateral carotid artery stenosis  I65.23 PCV CAROTID DUPLEX (BILATERAL)    3. Peripheral artery disease (HCC)  I73.9     4. Primary hypertension  I10     5. White coat syndrome with diagnosis of hypertension  I10       No orders of the defined types were placed in this encounter.  Medications Discontinued During This Encounter  Medication Reason   iron polysaccharides (NIFEREX) 150 MG capsule      Recommendations:   Brenda Barry  is a 86 y.o. AAfrican-American female with known coronary artery disease with remote coronary stenting 2001 and history of brachytherapy due to restenosis, difficult to control hypertension, asymptomatic bilateral carotid artery disease, PAD with both neurogenic claudication and vascular claudication, she has known right SFA occlusion, being managed medically.  Also has stage III chronic kidney disease due to  hypertension, hyperlipidemia.  She is presently doing well and is annual office visit.    No change in physical exam, home blood pressure has been under excellent control.  She does have whitecoat hypertension as well.  I reviewed her external labs, non-HDL cholesterol is at goal.  However due to night cramps, Crestor has been on hold and she is presently on Zetia.  I suspect she probably will need to be back on a statin, could consider adding Crestor back to her regimen may be every other day or every third day.  She has an appointment to see Dr. Melford Aase soon regarding this.  Although she has mild symptoms of claudication, her activity level is relatively low and she has not had any lifestyle limiting claudication.  She has known occluded right SFA and this is being managed medically.  Continue aspirin indefinitely.  She will need carotid artery surveillance, carotid duplex ordered today.  Otherwise stable from cardiac standpoint I will see her back in a year.   Adrian Prows, MD, [facility] 01/10/2022, 11:11 AM Office: [phone] Fax: [phone] Pager: [phone]

## 2022-02-12 ENCOUNTER — Other Ambulatory Visit: Payer: Self-pay | Admitting: Cardiology

## 2022-03-20 ENCOUNTER — Ambulatory Visit (INDEPENDENT_AMBULATORY_CARE_PROVIDER_SITE_OTHER): Payer: Medicare Other | Admitting: Podiatry

## 2022-03-20 ENCOUNTER — Encounter: Payer: Self-pay | Admitting: Podiatry

## 2022-03-20 DIAGNOSIS — M79674 Pain in right toe(s): Secondary | ICD-10-CM

## 2022-03-20 DIAGNOSIS — M79675 Pain in left toe(s): Secondary | ICD-10-CM

## 2022-03-20 DIAGNOSIS — I739 Peripheral vascular disease, unspecified: Secondary | ICD-10-CM

## 2022-03-20 DIAGNOSIS — D689 Coagulation defect, unspecified: Secondary | ICD-10-CM | POA: Diagnosis not present

## 2022-03-20 DIAGNOSIS — B351 Tinea unguium: Secondary | ICD-10-CM

## 2022-03-20 DIAGNOSIS — N183 Chronic kidney disease, stage 3 unspecified: Secondary | ICD-10-CM

## 2022-03-20 NOTE — Progress Notes (Signed)
This patient returns to my office for at risk foot care.  This patient requires this care by a professional since this patient will be at risk due to having chronic kidney disease and PAD..  This patient is unable to cut nails herself since the patient cannot reach her nails.These nails are painful walking and wearing shoes.  This patient presents for at risk foot care today.    General Appearance  Alert, conversant and in no acute stress.  Vascular  Dorsalis pedis and posterior tibial  pulses are palpable  bilaterally.  Capillary return is within normal limits  bilaterally. Temperature is within normal limits  bilaterally.  Neurologic  Senn-Weinstein monofilament wire test within normal limits  bilaterally. Muscle power within normal limits bilaterally.  Nails Thick disfigured discolored nails with subungual debris  from hallux to fifth toes bilaterally. No evidence of bacterial infection or drainage bilaterally.  Orthopedic  No limitations of motion  feet .  No crepitus or effusions noted.  No bony pathology or digital deformities noted.  HAV  B/L.  Skin  normotropic skin with no porokeratosis noted bilaterally.  No signs of infections or ulcers noted.   Asymptomatic HD 5th B/L.  Onychomycosis  Pain in right toes  Pain in left toes  Consent was obtained for treatment procedures.   Mechanical debridement of nails 1-5  bilaterally performed with a nail nipper.  Filed with dremel without incident.    Return office visit   3 months                   Told patient to return for periodic foot care and evaluation due to potential at risk complications.   Joseluis Alessio DPM  

## 2022-03-29 ENCOUNTER — Other Ambulatory Visit: Payer: Self-pay | Admitting: *Deleted

## 2022-03-29 DIAGNOSIS — D472 Monoclonal gammopathy: Secondary | ICD-10-CM

## 2022-03-29 DIAGNOSIS — E538 Deficiency of other specified B group vitamins: Secondary | ICD-10-CM

## 2022-03-29 DIAGNOSIS — D510 Vitamin B12 deficiency anemia due to intrinsic factor deficiency: Secondary | ICD-10-CM

## 2022-04-01 ENCOUNTER — Inpatient Hospital Stay: Payer: Medicare Other | Attending: Hematology

## 2022-04-01 ENCOUNTER — Other Ambulatory Visit: Payer: Self-pay

## 2022-04-01 DIAGNOSIS — D472 Monoclonal gammopathy: Secondary | ICD-10-CM | POA: Diagnosis present

## 2022-04-01 DIAGNOSIS — E538 Deficiency of other specified B group vitamins: Secondary | ICD-10-CM

## 2022-04-01 DIAGNOSIS — I10 Essential (primary) hypertension: Secondary | ICD-10-CM | POA: Insufficient documentation

## 2022-04-01 DIAGNOSIS — Z87891 Personal history of nicotine dependence: Secondary | ICD-10-CM | POA: Insufficient documentation

## 2022-04-01 DIAGNOSIS — D510 Vitamin B12 deficiency anemia due to intrinsic factor deficiency: Secondary | ICD-10-CM | POA: Insufficient documentation

## 2022-04-01 LAB — CMP (CANCER CENTER ONLY)
ALT: 10 U/L (ref 0–44)
AST: 16 U/L (ref 15–41)
Albumin: 3.9 g/dL (ref 3.5–5.0)
Alkaline Phosphatase: 55 U/L (ref 38–126)
Anion gap: 4 — ABNORMAL LOW (ref 5–15)
BUN: 18 mg/dL (ref 8–23)
CO2: 30 mmol/L (ref 22–32)
Calcium: 9.7 mg/dL (ref 8.9–10.3)
Chloride: 106 mmol/L (ref 98–111)
Creatinine: 1.37 mg/dL — ABNORMAL HIGH (ref 0.44–1.00)
GFR, Estimated: 38 mL/min — ABNORMAL LOW (ref >60)
Glucose, Bld: 117 mg/dL — ABNORMAL HIGH (ref 70–99)
Potassium: 3.4 mmol/L — ABNORMAL LOW (ref 3.5–5.1)
Sodium: 140 mmol/L (ref 135–145)
Total Bilirubin: 0.5 mg/dL (ref 0.3–1.2)
Total Protein: 7.2 g/dL (ref 6.5–8.1)

## 2022-04-01 LAB — CBC WITH DIFFERENTIAL (CANCER CENTER ONLY)
Abs Immature Granulocytes: 0.01 10*3/uL (ref 0.00–0.07)
Basophils Absolute: 0 10*3/uL (ref 0.0–0.1)
Basophils Relative: 1 %
Eosinophils Absolute: 0.3 10*3/uL (ref 0.0–0.5)
Eosinophils Relative: 4 %
HCT: 34.7 % — ABNORMAL LOW (ref 36.0–46.0)
Hemoglobin: 11.4 g/dL — ABNORMAL LOW (ref 12.0–15.0)
Immature Granulocytes: 0 %
Lymphocytes Relative: 47 %
Lymphs Abs: 3.1 10*3/uL (ref 0.7–4.0)
MCH: 29 pg (ref 26.0–34.0)
MCHC: 32.9 g/dL (ref 30.0–36.0)
MCV: 88.3 fL (ref 80.0–100.0)
Monocytes Absolute: 0.4 10*3/uL (ref 0.1–1.0)
Monocytes Relative: 6 %
Neutro Abs: 2.9 10*3/uL (ref 1.7–7.7)
Neutrophils Relative %: 42 %
Platelet Count: 273 10*3/uL (ref 150–400)
RBC: 3.93 MIL/uL (ref 3.87–5.11)
RDW: 14 % (ref 11.5–15.5)
WBC Count: 6.8 10*3/uL (ref 4.0–10.5)
nRBC: 0 % (ref 0.0–0.2)

## 2022-04-01 LAB — IRON AND IRON BINDING CAPACITY (CC-WL,HP ONLY)
Iron: 72 ug/dL (ref 28–170)
Saturation Ratios: 26 % (ref 10.4–31.8)
TIBC: 283 ug/dL (ref 250–450)
UIBC: 211 ug/dL (ref 148–442)

## 2022-04-01 LAB — FERRITIN: Ferritin: 33 ng/mL (ref 11–307)

## 2022-04-01 LAB — VITAMIN B12: Vitamin B-12: 1247 pg/mL — ABNORMAL HIGH (ref 180–914)

## 2022-04-02 LAB — KAPPA/LAMBDA LIGHT CHAINS
Kappa free light chain: 37.1 mg/L — ABNORMAL HIGH (ref 3.3–19.4)
Kappa, lambda light chain ratio: 3.02 — ABNORMAL HIGH (ref 0.26–1.65)
Lambda free light chains: 12.3 mg/L (ref 5.7–26.3)

## 2022-04-05 LAB — MULTIPLE MYELOMA PANEL, SERUM
Albumin SerPl Elph-Mcnc: 3.4 g/dL (ref 2.9–4.4)
Albumin/Glob SerPl: 1.1 (ref 0.7–1.7)
Alpha 1: 0.3 g/dL (ref 0.0–0.4)
Alpha2 Glob SerPl Elph-Mcnc: 0.7 g/dL (ref 0.4–1.0)
B-Globulin SerPl Elph-Mcnc: 1 g/dL (ref 0.7–1.3)
Gamma Glob SerPl Elph-Mcnc: 1.4 g/dL (ref 0.4–1.8)
Globulin, Total: 3.4 g/dL (ref 2.2–3.9)
IgA: 166 mg/dL (ref 64–422)
IgG (Immunoglobin G), Serum: 1538 mg/dL (ref 586–1602)
IgM (Immunoglobulin M), Srm: 48 mg/dL (ref 26–217)
M Protein SerPl Elph-Mcnc: 0.8 g/dL — ABNORMAL HIGH
Total Protein ELP: 6.8 g/dL (ref 6.0–8.5)

## 2022-04-08 ENCOUNTER — Inpatient Hospital Stay (HOSPITAL_BASED_OUTPATIENT_CLINIC_OR_DEPARTMENT_OTHER): Payer: Medicare Other | Admitting: Hematology

## 2022-04-08 VITALS — BP 151/70 | HR 80 | Temp 97.7°F | Resp 18 | Ht 65.0 in | Wt 216.8 lb

## 2022-04-08 DIAGNOSIS — D472 Monoclonal gammopathy: Secondary | ICD-10-CM

## 2022-04-08 DIAGNOSIS — D510 Vitamin B12 deficiency anemia due to intrinsic factor deficiency: Secondary | ICD-10-CM | POA: Diagnosis not present

## 2022-04-08 NOTE — Progress Notes (Signed)
HEMATOLOGY/ONCOLOGY CLINIC NOTE  Date of Service: 04/08/2022   Patient Care Team: Chesley Noon, MD as PCP - General (Family Medicine)  CHIEF COMPLAINTS/PURPOSE OF CONSULTATION:  Continued follow-up for MGUS and management of pernicious anemia  HISTORY OF PRESENTING ILLNESS:  Brenda Barry is a wonderful 86 y.o. female who has been referred to [place] by Dr. Anastasia Pall for evaluation and management of Monoclonal Paraproteinemia. The pt reports that she is doing well overall.   The pt reports that she has had sciatica in the last year with referred pain down her leg, caused by a pinched nerve at L4/5.  The pt is intending to see Dr. Leonel Ramsay in neurosurgery soon. The pt's lower back pain has been evaluated with an MRI most recently on 04/24/18. The pt is also being evaluated by cardiology.  The pt denies any neck pain whatsoever and denies any new bone pains.   The pt notes that her kidney functions have been stable recently and has been pursuing care with Dr. Erling Cruz at [facility]. The pt notes that her blood pressure has been high, and is elevated more when she sees physicians. She has taken HCTZ, Toprol-XL, and Hydralazine. The pt notes that her thyroid medication has recently been adjusted.   Of note prior to the patient's presentation, the pt had a Bone Survey on 01/03/17 which revealed No definite lytic lesions within the axial skeleton. 2. Mixed lytic and blastic lesion posteriorly at C7. CT or MRI could be used for further evaluation of the cervical spine. 3. 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 No discrete lytic lesions are present.   Subsequently the pt then had a CT Cervical Spine on 02/21/17 which revealed CT scan does not show a convincing abnormality within the C7 vertebral body. However, within the posterior C3 vertebral body, there is a well-circumscribed lucent area measuring 8 x 4 x 5  mm which, though not specific or conclusive, could represent a focus of myeloma.  Most recent lab results (04/21/18) of CMP is as follows: all values are WNL except for Glucose at 104, Creatinine at 1.07, GFR at 57, BUN at 10, Sodium at 145, Phosphorous at 2.2. 04/21/18 SPEP revealed all values WNL except for M-spike at 0.7, with IgG monoclonal protein with kappa light chain specificity.   On review of systems, pt reports sciatica, stable energy levels, stable lower back pain, and denies any neck pain, new bone pains, abdominal pains, chest wall pain, and any other symptoms.   On PMHx the pt reports Osteoporosis, Hypothyroidism, Hyperlipidemia, CAD with 5p stent place in late 90s, benign lumpectomy of right breast, HTN, IgG monoclonal gammopathy, CKD stage III.   Interval History:   [name] is here for her 43-monthfollow-up for continued evaluation and management of pernicious anemia and surveillance of MGUS. She notes no new bone pains.  No weight loss.  No fevers no chills no night sweats. She notes that she is cut down her B complex dose and is feeling better with that.  She was recommended to take this 2 times a week. She has also reduced her B12 to 1000 mcg every other day. She has been interested in trying to cut back on her medications as much as possible. She is taking her iron polysaccharide 150 mg p.o. daily and tolerating this okay. Labs done 1 week ago were discussed with her in detail.   MEDICAL HISTORY:  HTN HLD  Anxiety Sciatica  SURGICAL HISTORY: Past Surgical History:  Procedure Laterality Date   HIP SURGERY Right 10/05/2018    SOCIAL HISTORY: Social History   Socioeconomic History   Marital status: Widowed    Spouse name: Not on file   Number of children: 3   Years of education: Not on file   Highest education level: Not on file  Occupational History   Not on file  Tobacco Use   Smoking status: Former    Packs/day: 0.25    Years: 10.00    Total  pack years: 2.50    Types: Cigarettes    Quit date: 04/19/1971    Years since quitting: 51.0   Smokeless tobacco: Never  Vaping Use   Vaping Use: Never used  Substance and Sexual Activity   Alcohol use: No   Drug use: No   Sexual activity: Not on file  Other Topics Concern   Not on file  Social History Narrative   Not on file   Social Determinants of Health   Financial Resource Strain: Not on file  Food Insecurity: Not on file  Transportation Needs: Not on file  Physical Activity: Not on file  Stress: Not on file  Social Connections: Not on file  Intimate Partner Violence: Not on file    FAMILY HISTORY: Family History  Problem Relation Age of Onset   Tuberculosis Mother    Heart disease Father     ALLERGIES:  is allergic to amlodipine besylate, atorvastatin, isosorbide dinitrate, and rosuvastatin.  MEDICATIONS:  Current Outpatient Medications  Medication Sig Dispense Refill   albuterol (PROVENTIL HFA;VENTOLIN HFA) 108 (90 Base) MCG/ACT inhaler Inhale 2 puffs into the lungs as needed.     aspirin (ASPIRIN CHILDRENS) 81 MG chewable tablet Chew 1 tablet (81 mg total) by mouth daily.     B Complex-Biotin-FA (SUPER B-50 COMPLEX PO) Take 1 capsule by mouth.     budesonide-formoterol (SYMBICORT) 160-4.5 MCG/ACT inhaler Inhale into the lungs.     Cholecalciferol 50 MCG (2000 UT) CAPS Take by mouth.     cyanocobalamin 2000 MCG tablet Take 2,000 mcg by mouth.      ezetimibe (ZETIA) 10 MG tablet Take 10 mg by mouth daily.     hydrALAZINE (APRESOLINE) 50 MG tablet Take by mouth 2 (two) times daily.     latanoprost (XALATAN) 0.005 % ophthalmic solution Place 1 drop into both eyes at bedtime.     levothyroxine (SYNTHROID) 100 MCG tablet Take 100 mcg by mouth daily before breakfast.     olmesartan-hydrochlorothiazide (BENICAR HCT) 40-12.5 MG tablet Take 1 tablet by mouth daily. (Patient taking differently: Take 1 tablet by mouth daily. Taking in the morning) 90 tablet 2   No  current facility-administered medications for this visit.    REVIEW OF SYSTEMS:   .10 Point review of Systems was done is negative except as noted above.   PHYSICAL EXAMINATION:  . Vitals:   04/08/22 1032  BP: (!) 151/70  Pulse: 80  Resp: 18  Temp: 97.7 F (36.5 C)  SpO2: 99%   Filed Weights   04/08/22 1032  Weight: 216 lb 12.8 oz (98.3 kg)   .Body mass index is 36.08 kg/m. Marland Kitchen GENERAL:alert, in no acute distress and comfortable SKIN: no acute rashes, no significant lesions EYES: conjunctiva are pink and non-injected, sclera anicteric OROPHARYNX: MMM, no exudates, no oropharyngeal erythema or ulceration NECK: supple, no JVD LYMPH:  no palpable lymphadenopathy in the cervical, axillary or inguinal regions LUNGS: clear to auscultation b/l  with normal respiratory effort HEART: regular rate & rhythm ABDOMEN:  normoactive bowel sounds , non tender, not distended. Extremity: no pedal edema PSYCH: alert & oriented x 3 with fluent speech NEURO: no focal motor/sensory deficits  LABORATORY DATA:  I have reviewed the data as listed  .    Latest Ref Rng & Units 04/01/2022   10:44 AM 09/24/2021    9:55 AM 03/27/2021   10:14 AM  CBC  WBC 4.0 - 10.5 K/uL 6.8  6.4  5.8   Hemoglobin 12.0 - 15.0 g/dL 11.4  11.2  10.9   Hematocrit 36.0 - 46.0 % 34.7  35.2  32.9   Platelets 150 - 400 K/uL 273  278  272        Latest Ref Rng & Units 04/01/2022   10:44 AM 09/24/2021    9:55 AM 03/27/2021   10:14 AM  CMP  Glucose 70 - 99 mg/dL 117  99  94   BUN 8 - 23 mg/dL _0 Creatinine 0.44 - 1.00 mg/dL 1.37  1.35  1.36   Sodium 135 - 145 mmol/L 140  140  142   Potassium 3.5 - 5.1 mmol/L 3.4  3.7  4.1   Chloride 98 - 111 mmol/L 106  107  108   CO2 22 - 32 mmol/L _1 Calcium 8.9 - 10.3 mg/dL 9.7  9.7  9.9   Total Protein 6.5 - 8.1 g/dL 7.2  7.5  7.5   Total Bilirubin 0.3 - 1.2 mg/dL 0.5  0.4  0.4   Alkaline Phos 38 - 126 U/L 55  61  62   AST 15 - 41 U/L _2 ALT 0 -  44 U/L _3 . Lab Results  Component Value Date   IRON 72 04/01/2022   TIBC 283 04/01/2022   IRONPCTSAT 26 04/01/2022   (Iron and TIBC)  Lab Results  Component Value Date   FERRITIN 33 04/01/2022   B12 -743    RADIOGRAPHIC STUDIES: I have personally reviewed the radiological images as listed and agreed with the findings in the report. No results found.  ASSESSMENT & PLAN:   86 y.o. female with  1. MGUS- Monoclonal Gammopathy or undetermined significance. 01/03/17 Bone Survey revealed No definite lytic lesions within the axial skeleton. 2. Mixed lytic and blastic lesion posteriorly at C7. CT or MRI could be used for further evaluation of the cervical spine. 3. 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 No discrete lytic lesions are present.  02/21/17 CT Cervical Spine revealed CT scan does not show a convincing abnormality within the C7 vertebral body. However, within the posterior C3 vertebral body, there is a well-circumscribed lucent area measuring 8 x 4 x 5 mm which, though not specific or conclusive, could represent a focus of myeloma.  04/24/18 MRI Lumbar Spine revealed degenerative disc disease, multilevel disc bulges and disc osteophytes and advised that the pt speak with her PCP regarding 2.9cm cystic lesion in right adnexa  Labs from initial presentation from 04/21/18: M Protein at 0.7g  2. Pernicious anemia B12 levels today stable at 743. PLAN: -Lab results from 04/01/2022 were discussed in detail with the patient. CBC shows improving anemia with hemoglobin up now to 11.4.  Normal WBC count and platelets. Patient's myeloma labs show stable M spike of 0.8 grams per deciliter Patient  has no overt clinical or lab evidence of progression of MGUS to multiple myeloma. -She has has cut down her sublingual B12 to 1000 mcg every other day. She has cut down her B complex to twice a week. -She was  recommended to recheck blood counts and B12 levels with PCP in 3 months. -Continue iron polysaccharide 150 mg p.o. daily.  Recommend that she take this with orange juice to improve absorption -Continue vitamin D 2000 units p.o. daily -Continue to optimize Synthroid dose with primary care physician   2. HTN -chronically uncontrolled . Element of white coat hypertension. Patient notes BP much better at home.  PLAN -continue compliance with anti HTN -continue f/u with PCP to optimize BP control   FOLLOW UP: Phone visit with Dr. Irene Limbo in 6 months Labs 1 week prior to phone visit  .The total time spent in the appointment was 20 minutes* .  All of the patient's questions were answered with apparent satisfaction. The patient knows to call the clinic with any problems, questions or concerns.   Sullivan Lone MD MS AAHIVMS [facility] Hematology/Oncology Physician [facility]  .*Total Encounter Time as defined by the Centers for Medicare and Medicaid Services includes, in addition to the face-to-face time of a patient visit (documented in the note above) non-face-to-face time: obtaining and reviewing outside history, ordering and reviewing medications, tests or procedures, care coordination (communications with other health care professionals or caregivers) and documentation in the medical record.

## 2022-06-26 ENCOUNTER — Ambulatory Visit (INDEPENDENT_AMBULATORY_CARE_PROVIDER_SITE_OTHER): Payer: Medicare Other | Admitting: Podiatry

## 2022-06-26 ENCOUNTER — Encounter: Payer: Self-pay | Admitting: Podiatry

## 2022-06-26 DIAGNOSIS — D689 Coagulation defect, unspecified: Secondary | ICD-10-CM | POA: Diagnosis not present

## 2022-06-26 DIAGNOSIS — N183 Chronic kidney disease, stage 3 unspecified: Secondary | ICD-10-CM

## 2022-06-26 DIAGNOSIS — M79675 Pain in left toe(s): Secondary | ICD-10-CM

## 2022-06-26 DIAGNOSIS — I739 Peripheral vascular disease, unspecified: Secondary | ICD-10-CM

## 2022-06-26 DIAGNOSIS — B351 Tinea unguium: Secondary | ICD-10-CM | POA: Diagnosis not present

## 2022-06-26 DIAGNOSIS — M79674 Pain in right toe(s): Secondary | ICD-10-CM

## 2022-06-26 NOTE — Progress Notes (Signed)
This patient returns to my office for at risk foot care.  This patient requires this care by a professional since this patient will be at risk due to having chronic kidney disease and PAD..  This patient is unable to cut nails herself since the patient cannot reach her nails.These nails are painful walking and wearing shoes.  This patient presents for at risk foot care today.    General Appearance  Alert, conversant and in no acute stress.  Vascular  Dorsalis pedis and posterior tibial  pulses are palpable  bilaterally.  Capillary return is within normal limits  bilaterally. Temperature is within normal limits  bilaterally.  Neurologic  Senn-Weinstein monofilament wire test within normal limits  bilaterally. Muscle power within normal limits bilaterally.  Nails Thick disfigured discolored nails with subungual debris  from hallux to fifth toes bilaterally. No evidence of bacterial infection or drainage bilaterally.  Orthopedic  No limitations of motion  feet .  No crepitus or effusions noted.  No bony pathology or digital deformities noted.  HAV  B/L.  Skin  normotropic skin with no porokeratosis noted bilaterally.  No signs of infections or ulcers noted.   Asymptomatic HD 5th B/L.  Onychomycosis  Pain in right toes  Pain in left toes  Consent was obtained for treatment procedures.   Mechanical debridement of nails 1-5  bilaterally performed with a nail nipper.  Filed with dremel without incident.    Return office visit   3 months                   Told patient to return for periodic foot care and evaluation due to potential at risk complications.   Lynnelle Mesmer DPM  

## 2022-07-17 ENCOUNTER — Telehealth: Payer: Self-pay | Admitting: Hematology

## 2022-07-17 NOTE — Telephone Encounter (Signed)
Patient called to r/s phone appointment. Patient notified of new appointment date/time

## 2022-09-11 ENCOUNTER — Encounter: Payer: Self-pay | Admitting: Podiatry

## 2022-09-11 ENCOUNTER — Ambulatory Visit (INDEPENDENT_AMBULATORY_CARE_PROVIDER_SITE_OTHER): Payer: Medicare Other | Admitting: Podiatry

## 2022-09-11 VITALS — BP 194/74 | HR 77

## 2022-09-11 DIAGNOSIS — M79675 Pain in left toe(s): Secondary | ICD-10-CM

## 2022-09-11 DIAGNOSIS — M79674 Pain in right toe(s): Secondary | ICD-10-CM

## 2022-09-11 DIAGNOSIS — B351 Tinea unguium: Secondary | ICD-10-CM | POA: Diagnosis not present

## 2022-09-11 NOTE — Progress Notes (Signed)
This patient returns to my office for at risk foot care.  This patient requires this care by a professional since this patient will be at risk due to having chronic kidney disease and PAD.Brenda Barry  This patient is unable to cut nails herself since the patient cannot reach her nails.These nails are painful walking and wearing shoes.  This patient presents for at risk foot care today.    General Appearance  Alert, conversant and in no acute stress.  Vascular  Dorsalis pedis and posterior tibial  pulses are palpable  bilaterally.  Capillary return is within normal limits  bilaterally. Temperature is within normal limits  bilaterally.  Neurologic  Senn-Weinstein monofilament wire test within normal limits  bilaterally. Muscle power within normal limits bilaterally.  Nails Thick disfigured discolored nails with subungual debris  from hallux to fifth toes bilaterally. No evidence of bacterial infection or drainage bilaterally.  Orthopedic  No limitations of motion  feet .  No crepitus or effusions noted.  No bony pathology or digital deformities noted.  HAV  B/L.  Skin  normotropic skin with no porokeratosis noted bilaterally.  No signs of infections or ulcers noted.   Asymptomatic HD 5th B/L.  Onychomycosis  Pain in right toes  Pain in left toes  Consent was obtained for treatment procedures.   Mechanical debridement of nails 1-5  bilaterally performed with a nail nipper.  Filed with dremel without incident.    Return office visit   3 months                   Told patient to return for periodic foot care and evaluation due to potential at risk complications.   Gardiner Barefoot DPM

## 2022-09-30 ENCOUNTER — Ambulatory Visit: Payer: Medicare Other | Admitting: Podiatry

## 2022-10-08 ENCOUNTER — Other Ambulatory Visit: Payer: Self-pay

## 2022-10-08 ENCOUNTER — Inpatient Hospital Stay: Payer: Medicare Other | Attending: Hematology

## 2022-10-08 DIAGNOSIS — D510 Vitamin B12 deficiency anemia due to intrinsic factor deficiency: Secondary | ICD-10-CM | POA: Insufficient documentation

## 2022-10-08 DIAGNOSIS — I129 Hypertensive chronic kidney disease with stage 1 through stage 4 chronic kidney disease, or unspecified chronic kidney disease: Secondary | ICD-10-CM | POA: Diagnosis not present

## 2022-10-08 DIAGNOSIS — D472 Monoclonal gammopathy: Secondary | ICD-10-CM

## 2022-10-08 DIAGNOSIS — N189 Chronic kidney disease, unspecified: Secondary | ICD-10-CM | POA: Diagnosis not present

## 2022-10-08 DIAGNOSIS — Z87891 Personal history of nicotine dependence: Secondary | ICD-10-CM | POA: Insufficient documentation

## 2022-10-08 LAB — CBC WITH DIFFERENTIAL (CANCER CENTER ONLY)
Abs Immature Granulocytes: 0.02 10*3/uL (ref 0.00–0.07)
Basophils Absolute: 0 10*3/uL (ref 0.0–0.1)
Basophils Relative: 1 %
Eosinophils Absolute: 0.3 10*3/uL (ref 0.0–0.5)
Eosinophils Relative: 4 %
HCT: 35.1 % — ABNORMAL LOW (ref 36.0–46.0)
Hemoglobin: 11.6 g/dL — ABNORMAL LOW (ref 12.0–15.0)
Immature Granulocytes: 0 %
Lymphocytes Relative: 43 %
Lymphs Abs: 2.4 10*3/uL (ref 0.7–4.0)
MCH: 29.3 pg (ref 26.0–34.0)
MCHC: 33 g/dL (ref 30.0–36.0)
MCV: 88.6 fL (ref 80.0–100.0)
Monocytes Absolute: 0.5 10*3/uL (ref 0.1–1.0)
Monocytes Relative: 9 %
Neutro Abs: 2.4 10*3/uL (ref 1.7–7.7)
Neutrophils Relative %: 43 %
Platelet Count: 261 10*3/uL (ref 150–400)
RBC: 3.96 MIL/uL (ref 3.87–5.11)
RDW: 14.1 % (ref 11.5–15.5)
WBC Count: 5.7 10*3/uL (ref 4.0–10.5)
nRBC: 0 % (ref 0.0–0.2)

## 2022-10-08 LAB — CMP (CANCER CENTER ONLY)
ALT: 16 U/L (ref 0–44)
AST: 22 U/L (ref 15–41)
Albumin: 4 g/dL (ref 3.5–5.0)
Alkaline Phosphatase: 59 U/L (ref 38–126)
Anion gap: 5 (ref 5–15)
BUN: 18 mg/dL (ref 8–23)
CO2: 30 mmol/L (ref 22–32)
Calcium: 9.8 mg/dL (ref 8.9–10.3)
Chloride: 106 mmol/L (ref 98–111)
Creatinine: 1.31 mg/dL — ABNORMAL HIGH (ref 0.44–1.00)
GFR, Estimated: 40 mL/min — ABNORMAL LOW (ref >60)
Glucose, Bld: 92 mg/dL (ref 70–99)
Potassium: 4 mmol/L (ref 3.5–5.1)
Sodium: 141 mmol/L (ref 135–145)
Total Bilirubin: 0.4 mg/dL (ref 0.3–1.2)
Total Protein: 7.3 g/dL (ref 6.5–8.1)

## 2022-10-08 LAB — VITAMIN B12: Vitamin B-12: 833 pg/mL (ref 180–914)

## 2022-10-08 LAB — IRON AND IRON BINDING CAPACITY (CC-WL,HP ONLY)
Iron: 52 ug/dL (ref 28–170)
Saturation Ratios: 18 % (ref 10.4–31.8)
TIBC: 297 ug/dL (ref 250–450)
UIBC: 245 ug/dL (ref 148–442)

## 2022-10-08 LAB — FERRITIN: Ferritin: 66 ng/mL (ref 11–307)

## 2022-10-11 ENCOUNTER — Inpatient Hospital Stay (HOSPITAL_BASED_OUTPATIENT_CLINIC_OR_DEPARTMENT_OTHER): Payer: Medicare Other | Admitting: Hematology

## 2022-10-11 DIAGNOSIS — D510 Vitamin B12 deficiency anemia due to intrinsic factor deficiency: Secondary | ICD-10-CM

## 2022-10-11 DIAGNOSIS — D472 Monoclonal gammopathy: Secondary | ICD-10-CM | POA: Diagnosis not present

## 2022-10-11 MED ORDER — LEVOTHYROXINE SODIUM 88 MCG PO TABS: 100.0000 ug | ORAL_TABLET | Freq: Every day | ORAL | Status: DC

## 2022-10-11 NOTE — Progress Notes (Signed)
HEMATOLOGY/ONCOLOGY PHONE VISIT NOTE  Date of Service: 10/11/22    Patient Care Team: Chesley Noon, MD as PCP - General (Family Medicine)  CHIEF COMPLAINTS/PURPOSE OF CONSULTATION:  Continued follow-up for MGUS and management of pernicious anemia  HISTORY OF PRESENTING ILLNESS:  Brenda Barry is a wonderful 87 y.o. female who has been referred to [place] by Dr. Anastasia Pall for evaluation and management of Monoclonal Paraproteinemia. The pt reports that she is doing well overall.   The pt reports that she has had sciatica in the last year with referred pain down her leg, caused by a pinched nerve at L4/5.  The pt is intending to see Dr. Leonel Ramsay in neurosurgery soon. The pt's lower back pain has been evaluated with an MRI most recently on 04/24/18. The pt is also being evaluated by cardiology.  The pt denies any neck pain whatsoever and denies any new bone pains.   The pt notes that her kidney functions have been stable recently and has been pursuing care with Dr. Erling Cruz at [facility]. The pt notes that her blood pressure has been high, and is elevated more when she sees physicians. She has taken HCTZ, Toprol-XL, and Hydralazine. The pt notes that her thyroid medication has recently been adjusted.   Of note prior to the patient's presentation, the pt had a Bone Survey on 01/03/17 which revealed No definite lytic lesions within the axial skeleton. 2. Mixed lytic and blastic lesion posteriorly at C7. CT or MRI could be used for further evaluation of the cervical spine. 3. 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 No discrete lytic lesions are present.   Subsequently the pt then had a CT Cervical Spine on 02/21/17 which revealed CT scan does not show a convincing abnormality within the C7 vertebral body. However, within the posterior C3 vertebral body, there is a well-circumscribed lucent area measuring 8 x 4  x 5 mm which, though not specific or conclusive, could represent a focus of myeloma.  Most recent lab results (04/21/18) of CMP is as follows: all values are WNL except for Glucose at 104, Creatinine at 1.07, GFR at 57, BUN at 10, Sodium at 145, Phosphorous at 2.2. 04/21/18 SPEP revealed all values WNL except for M-spike at 0.7, with IgG monoclonal protein with kappa light chain specificity.   On review of systems, pt reports sciatica, stable energy levels, stable lower back pain, and denies any neck pain, new bone pains, abdominal pains, chest wall pain, and any other symptoms.   On PMHx the pt reports Osteoporosis, Hypothyroidism, Hyperlipidemia, CAD with 5p stent place in late 90s, benign lumpectomy of right breast, HTN, IgG monoclonal gammopathy, CKD stage III.   Interval History:   Brenda Barry is here for her 61-month follow-up for continued evaluation and management of pernicious anemia and surveillance of MGUS. Patient was last seen by me on 04/08/2022 and was doing well overall with no new medical concerns.  I connected with Doneen Poisson on 10/11/22 at  9:30 AM EDT by telephone visit and verified that I am speaking with the correct person using two identifiers.   I discussed the limitations, risks, security and privacy concerns of performing an evaluation and management service by telemedicine and the availability of in-person appointments. I also discussed with the patient that there may be a patient responsible charge related to this service. The patient expressed understanding and agreed to proceed.   Other persons  participating in the visit and their role in the encounter: none   Patient's location: home  Provider's location: [facility]   Chief Complaint: Continued follow-up for MGUS and management of pernicious anemia    Today, she reports that she has recently discontinued Pravastatin due to increased nightmares. Since stopping it, her nightmares have improved. She does continue  to take Zetium to manage her cholesterol. She does report that her Levothyroxine had been recently lowered from 100 micrograms to 88 micrograms.  She denies any infection issues and has had no issues tolerating her oral iron or vitamin B12 supplements. She compliantly takes 1000 micrograms every other day of Vitamin B12 and iron polysaccharide daily.   MEDICAL HISTORY:  HTN HLD Anxiety Sciatica  SURGICAL HISTORY: Past Surgical History:  Procedure Laterality Date   HIP SURGERY Right 10/05/2018    SOCIAL HISTORY: Social History   Socioeconomic History   Marital status: Widowed    Spouse name: Not on file   Number of children: 3   Years of education: Not on file   Highest education level: Not on file  Occupational History   Not on file  Tobacco Use   Smoking status: Former    Packs/day: 0.25    Years: 10.00    Additional pack years: 0.00    Total pack years: 2.50    Types: Cigarettes    Quit date: 04/19/1971    Years since quitting: 51.5   Smokeless tobacco: Never  Vaping Use   Vaping Use: Never used  Substance and Sexual Activity   Alcohol use: No   Drug use: No   Sexual activity: Not on file  Other Topics Concern   Not on file  Social History Narrative   Not on file   Social Determinants of Health   Financial Resource Strain: Not on file  Food Insecurity: Not on file  Transportation Needs: Not on file  Physical Activity: Not on file  Stress: Not on file  Social Connections: Not on file  Intimate Partner Violence: Not on file    FAMILY HISTORY: Family History  Problem Relation Age of Onset   Tuberculosis Mother    Heart disease Father     ALLERGIES:  is allergic to amlodipine besylate, atorvastatin, isosorbide dinitrate, and rosuvastatin.  MEDICATIONS:  Current Outpatient Medications  Medication Sig Dispense Refill   albuterol (PROVENTIL HFA;VENTOLIN HFA) 108 (90 Base) MCG/ACT inhaler Inhale 2 puffs into the lungs as needed.     aspirin (ASPIRIN  CHILDRENS) 81 MG chewable tablet Chew 1 tablet (81 mg total) by mouth daily.     B Complex-Biotin-FA (SUPER B-50 COMPLEX PO) Take 1 capsule by mouth.     budesonide-formoterol (SYMBICORT) 160-4.5 MCG/ACT inhaler Inhale into the lungs.     Cholecalciferol 50 MCG (2000 UT) CAPS Take by mouth.     cyanocobalamin 2000 MCG tablet Take 2,000 mcg by mouth.      ezetimibe (ZETIA) 10 MG tablet Take 10 mg by mouth daily.     hydrALAZINE (APRESOLINE) 50 MG tablet Take by mouth 2 (two) times daily.     latanoprost (XALATAN) 0.005 % ophthalmic solution Place 1 drop into both eyes at bedtime.     levothyroxine (SYNTHROID) 100 MCG tablet Take 100 mcg by mouth daily before breakfast.     olmesartan-hydrochlorothiazide (BENICAR HCT) 40-12.5 MG tablet Take 1 tablet by mouth daily. (Patient taking differently: Take 1 tablet by mouth daily. Taking in the morning) 90 tablet 2   No current facility-administered medications for  this visit.    REVIEW OF SYSTEMS:    10 Point review of Systems was done is negative except as noted above.   PHYSICAL EXAMINATION:  . There were no vitals filed for this visit.  There were no vitals filed for this visit.  .There is no height or weight on file to calculate BMI.   LABORATORY DATA:  I have reviewed the data as listed  .    Latest Ref Rng & Units 10/08/2022   10:14 AM 04/01/2022   10:44 AM 09/24/2021    9:55 AM  CBC  WBC 4.0 - 10.5 K/uL 5.7  6.8  6.4   Hemoglobin 12.0 - 15.0 g/dL 11.6  11.4  11.2   Hematocrit 36.0 - 46.0 % 35.1  34.7  35.2   Platelets 150 - 400 K/uL 261  273  278        Latest Ref Rng & Units 10/08/2022   10:14 AM 04/01/2022   10:44 AM 09/24/2021    9:55 AM  CMP  Glucose 70 - 99 mg/dL 92  117  99   BUN 8 - 23 mg/dL 18  18  17    Creatinine 0.44 - 1.00 mg/dL 1.31  1.37  1.35   Sodium 135 - 145 mmol/L 141  140  140   Potassium 3.5 - 5.1 mmol/L 4.0  3.4  3.7   Chloride 98 - 111 mmol/L 106  106  107   CO2 22 - 32 mmol/L 30  30  29    Calcium  8.9 - 10.3 mg/dL 9.8  9.7  9.7   Total Protein 6.5 - 8.1 g/dL 7.3  7.2  7.5   Total Bilirubin 0.3 - 1.2 mg/dL 0.4  0.5  0.4   Alkaline Phos 38 - 126 U/L 59  55  61   AST 15 - 41 U/L 22  16  21    ALT 0 - 44 U/L 16  10  15     . Lab Results  Component Value Date   IRON 52 10/08/2022   TIBC 297 10/08/2022   IRONPCTSAT 18 10/08/2022   (Iron and TIBC)  Lab Results  Component Value Date   FERRITIN 66 10/08/2022   B12 -743    RADIOGRAPHIC STUDIES: I have personally reviewed the radiological images as listed and agreed with the findings in the report. No results found.  ASSESSMENT & PLAN:   87 y.o. female with  1. MGUS- Monoclonal Gammopathy or undetermined significance. 01/03/17 Bone Survey revealed No definite lytic lesions within the axial skeleton. 2. Mixed lytic and blastic lesion posteriorly at C7. CT or MRI could be used for further evaluation of the cervical spine. 3. 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 No discrete lytic lesions are present.  02/21/17 CT Cervical Spine revealed CT scan does not show a convincing abnormality within the C7 vertebral body. However, within the posterior C3 vertebral body, there is a well-circumscribed lucent area measuring 8 x 4 x 5 mm which, though not specific or conclusive, could represent a focus of myeloma.  04/24/18 MRI Lumbar Spine revealed degenerative disc disease, multilevel disc bulges and disc osteophytes and advised that the pt speak with her PCP regarding 2.9cm cystic lesion in right adnexa  Labs from initial presentation from 04/21/18: M Protein at 0.7g  2. Pernicious anemia B12 levels today stable at 743.  PLAN:  -discussed lab results from 10/08/2022 in detail with patient. CBC showed WBC of 5.7K, hemoglobin  improved to 11.6, and platelets of 261K -normal WBC and platelet levels -CMP shows stable chronic kidney disease, otherwise normal -vitamin B12  stable -Myeloma lab shows stable M spike of 1g/dl -no anticipation that myeloma lab will be abnormal at this time -Iron labs show great response to oral iron: ferritin level is 66, Iron saturation level is 18%, discussed optimal goal of 20% -continue current dose of sublingual B12 1000 mcg every other day -continue 150 mg p.o. daily iron polysaccharide for approximately 6 months, dose may be lowered after this time period -Continue iron polysaccharide 150 mg p.o. daily.  Recommend that she take this with orange juice to improve absorption -Continue vitamin D 2000 units p.o. daily -Continue to optimize Synthroid dose with primary care physician -continue current dose of vitamin B complex supplement -Continue to monitor with labs in 6 months, may adjust to once a year after that visit -Informed patient that her thyroid levels may affect her sleep patterns and sleep changes   2. HTN -chronically uncontrolled . Element of white coat hypertension. Patient notes BP much better at home.  PLAN -continue compliance with anti HTN -continue f/u with PCP to optimize BP control   FOLLOW UP: Phone visit with Dr. Irene Limbo in 6 months Labs 2 weeks prior to phone visit  The total time spent in the appointment was 15 minutes* .  All of the patient's questions were answered with apparent satisfaction. The patient knows to call the clinic with any problems, questions or concerns.   Sullivan Lone MD MS AAHIVMS [facility] Hematology/Oncology Physician [facility]  .*Total Encounter Time as defined by the Centers for Medicare and Medicaid Services includes, in addition to the face-to-face time of a patient visit (documented in the note above) non-face-to-face time: obtaining and reviewing outside history, ordering and reviewing medications, tests or procedures, care coordination (communications with other health care professionals or caregivers) and documentation in the medical record.    I,Mitra  Faeizi,acting as a Education administrator for Sullivan Lone, MD.,have documented all relevant documentation on the behalf of Sullivan Lone, MD,as directed by  Sullivan Lone, MD while in the presence of Sullivan Lone, MD.  .I have reviewed the above documentation for accuracy and completeness, and I agree with the above. Brunetta Genera MD

## 2022-10-14 LAB — MULTIPLE MYELOMA PANEL, SERUM
Albumin SerPl Elph-Mcnc: 3.5 g/dL (ref 2.9–4.4)
Albumin/Glob SerPl: 1.1 (ref 0.7–1.7)
Alpha 1: 0.3 g/dL (ref 0.0–0.4)
Alpha2 Glob SerPl Elph-Mcnc: 0.7 g/dL (ref 0.4–1.0)
B-Globulin SerPl Elph-Mcnc: 0.9 g/dL (ref 0.7–1.3)
Gamma Glob SerPl Elph-Mcnc: 1.3 g/dL (ref 0.4–1.8)
Globulin, Total: 3.3 g/dL (ref 2.2–3.9)
IgA: 147 mg/dL (ref 64–422)
IgG (Immunoglobin G), Serum: 1479 mg/dL (ref 586–1602)
IgM (Immunoglobulin M), Srm: 36 mg/dL (ref 26–217)
M Protein SerPl Elph-Mcnc: 1 g/dL — ABNORMAL HIGH
Total Protein ELP: 6.8 g/dL (ref 6.0–8.5)

## 2022-10-15 ENCOUNTER — Telehealth: Payer: Medicare Other | Admitting: Hematology

## 2022-10-15 ENCOUNTER — Telehealth: Payer: Self-pay | Admitting: Hematology

## 2022-10-15 NOTE — Telephone Encounter (Signed)
Per 3/22 LOS reached out to schedule patient. Patient aware of date and time of appointment.

## 2022-12-11 ENCOUNTER — Ambulatory Visit: Payer: Medicare Other

## 2022-12-11 DIAGNOSIS — I6523 Occlusion and stenosis of bilateral carotid arteries: Secondary | ICD-10-CM

## 2022-12-12 ENCOUNTER — Ambulatory Visit (INDEPENDENT_AMBULATORY_CARE_PROVIDER_SITE_OTHER): Payer: Medicare Other | Admitting: Podiatry

## 2022-12-12 ENCOUNTER — Encounter: Payer: Self-pay | Admitting: Podiatry

## 2022-12-12 DIAGNOSIS — N183 Chronic kidney disease, stage 3 unspecified: Secondary | ICD-10-CM

## 2022-12-12 DIAGNOSIS — M79674 Pain in right toe(s): Secondary | ICD-10-CM

## 2022-12-12 DIAGNOSIS — B351 Tinea unguium: Secondary | ICD-10-CM | POA: Diagnosis not present

## 2022-12-12 DIAGNOSIS — M79675 Pain in left toe(s): Secondary | ICD-10-CM

## 2022-12-12 NOTE — Progress Notes (Signed)
This patient returns to my office for at risk foot care.  This patient requires this care by a professional since this patient will be at risk due to having chronic kidney disease and PAD..  This patient is unable to cut nails herself since the patient cannot reach her nails.These nails are painful walking and wearing shoes.  This patient presents for at risk foot care today.    General Appearance  Alert, conversant and in no acute stress.  Vascular  Dorsalis pedis and posterior tibial  pulses are palpable  bilaterally.  Capillary return is within normal limits  bilaterally. Temperature is within normal limits  bilaterally.  Neurologic  Senn-Weinstein monofilament wire test within normal limits  bilaterally. Muscle power within normal limits bilaterally.  Nails Thick disfigured discolored nails with subungual debris  from hallux to fifth toes bilaterally. No evidence of bacterial infection or drainage bilaterally.  Orthopedic  No limitations of motion  feet .  No crepitus or effusions noted.  No bony pathology or digital deformities noted.  HAV  B/L.  Skin  normotropic skin with no porokeratosis noted bilaterally.  No signs of infections or ulcers noted.   Asymptomatic HD 5th B/L.  Onychomycosis  Pain in right toes  Pain in left toes  Consent was obtained for treatment procedures.   Mechanical debridement of nails 1-5  bilaterally performed with a nail nipper.  Filed with dremel without incident.    Return office visit   3 months                   Told patient to return for periodic foot care and evaluation due to potential at risk complications.   Armie Moren DPM  

## 2022-12-13 ENCOUNTER — Ambulatory Visit: Payer: Medicare Other | Admitting: Adult Health

## 2022-12-16 NOTE — Progress Notes (Signed)
Carotid artery duplex 12/11/2022: Duplex suggests stenosis in the right internal carotid artery (16-49%). Duplex suggests stenosis in the left internal carotid artery (50-69%). <50% stenosis in the left external carotid artery. Antegrade right vertebral artery flow. Antegrade left vertebral artery flow. No significant change since 12/20/2021. Follow up in six months is appropriate if clinically indicated.

## 2023-01-09 ENCOUNTER — Encounter: Payer: Self-pay | Admitting: Nurse Practitioner

## 2023-01-09 ENCOUNTER — Encounter: Payer: Self-pay | Admitting: Cardiology

## 2023-01-09 ENCOUNTER — Ambulatory Visit: Payer: Medicare Other | Admitting: Cardiology

## 2023-01-09 VITALS — BP 188/87 | HR 83 | Ht 65.0 in | Wt 213.0 lb

## 2023-01-09 DIAGNOSIS — G720 Drug-induced myopathy: Secondary | ICD-10-CM

## 2023-01-09 DIAGNOSIS — I10 Essential (primary) hypertension: Secondary | ICD-10-CM

## 2023-01-09 DIAGNOSIS — I2510 Atherosclerotic heart disease of native coronary artery without angina pectoris: Secondary | ICD-10-CM

## 2023-01-09 DIAGNOSIS — E7800 Pure hypercholesterolemia, unspecified: Secondary | ICD-10-CM

## 2023-01-09 DIAGNOSIS — I6523 Occlusion and stenosis of bilateral carotid arteries: Secondary | ICD-10-CM

## 2023-01-09 NOTE — Progress Notes (Signed)
Primary Physician/Referring:  Sharon Seller, NP  Patient ID: Brenda Barry, female    DOB: 06-21-1936, 87 y.o.   MRN: [id number]  Chief Complaint  Patient presents with   Coronary Artery Disease   Follow-up   Hypertension   HPI:    Brenda Barry  is a 87 y.o.  African-American female with known coronary artery disease with remote coronary stenting 2001 and history of brachytherapy due to restenosis, difficult to control hypertension, asymptomatic bilateral carotid artery disease, PAD with both neurogenic claudication and vascular claudication, she has known right SFA occlusion, being managed medically, MGUS, stage III chronic kidney disease due to hypertension, hyperlipidemia.  She is presently doing well and is annual office visit.  States that her blood pressure has been well controlled at home, dyspnea has remained stable, she has not had any chest pain.  Past Medical History:  Diagnosis Date   Bilateral carotid bruits 02/12/2019   Coronary artery disease    Hyperlipidemia    Hypertension    Peripheral artery disease (HCC) 02/12/2019   Past Surgical History:  Procedure Laterality Date   HIP SURGERY Right 10/05/2018   Family History  Problem Relation Age of Onset   Tuberculosis Mother    Heart disease Father     Social History   Tobacco Use   Smoking status: Former    Packs/day: 0.25    Years: 10.00    Additional pack years: 0.00    Total pack years: 2.50    Types: Cigarettes    Quit date: 04/19/1971    Years since quitting: 51.7   Smokeless tobacco: Never  Substance Use Topics   Alcohol use: No   Marital Status: Widowed ROS  Review of Systems  Cardiovascular:  Negative for chest pain, dyspnea on exertion and leg swelling.   Objective  Blood pressure (!) 188/87, pulse 83, height 5\' 5"  (1.651 m), weight 213 lb (96.6 kg), SpO2 100 %.     01/09/2023   10:46 AM 09/11/2022    9:54 AM 09/11/2022    9:40 AM  Vitals with BMI  Height 5\' 5"     Weight 213  lbs    BMI 35.44    Systolic 188 194 295  Diastolic 87 74 91  Pulse 83 77      Physical Exam Neck:     Vascular: Carotid bruit (bilateral R>L) present. No JVD.  Cardiovascular:     Rate and Rhythm: Normal rate and regular rhythm. Extrasystoles are present.    Pulses: Intact distal pulses.     Heart sounds: S1 normal and S2 normal. Murmur heard.     Harsh early systolic murmur is present with a grade of 2/6 at the upper right sternal border radiating to the neck.     No gallop.  Pulmonary:     Effort: Pulmonary effort is normal.     Breath sounds: Normal breath sounds.  Abdominal:     General: Bowel sounds are normal.     Palpations: Abdomen is soft.     Comments: obese  Musculoskeletal:     Right lower leg: No edema.     Left lower leg: No edema.    Laboratory examination:   Recent Labs    04/01/22 1044 10/08/22 1014  NA 140 141  K 3.4* 4.0  CL 106 106  CO2 30 30  GLUCOSE 117* 92  BUN 18 18  CREATININE 1.37* 1.31*  CALCIUM 9.7 9.8  GFRNONAA 38* 40*  Latest Ref Rng & Units 10/08/2022   10:14 AM 04/01/2022   10:44 AM 09/24/2021    9:55 AM  CMP  Glucose 70 - 99 mg/dL 92  161  99   BUN 8 - 23 mg/dL 18  18  17    Creatinine 0.44 - 1.00 mg/dL 0.96  0.45  4.09   Sodium 135 - 145 mmol/L 141  140  140   Potassium 3.5 - 5.1 mmol/L 4.0  3.4  3.7   Chloride 98 - 111 mmol/L 106  106  107   CO2 22 - 32 mmol/L 30  30  29    Calcium 8.9 - 10.3 mg/dL 9.8  9.7  9.7   Total Protein 6.5 - 8.1 g/dL 7.3  7.2  7.5   Total Bilirubin 0.3 - 1.2 mg/dL 0.4  0.5  0.4   Alkaline Phos 38 - 126 U/L 59  55  61   AST 15 - 41 U/L 22  16  21    ALT 0 - 44 U/L 16  10  15        Latest Ref Rng & Units 10/08/2022   10:14 AM 04/01/2022   10:44 AM 09/24/2021    9:55 AM  CBC  WBC 4.0 - 10.5 K/uL 5.7  6.8  6.4   Hemoglobin 12.0 - 15.0 g/dL 81.1  91.4  78.2   Hematocrit 36.0 - 46.0 % 35.1  34.7  35.2   Platelets 150 - 400 K/uL 261  273  278    External labs:   Labs 08/07/2022:  Total  cholesterol 194, triglycerides 66, HDL 81, LDL 101. NHDL 113.  TSH reduced at 0.432.  Vitamin D normal at 40.3.  Lipid panel 05/07/2022:  Total cholesterol 244, triglycerides 77, HDL 73, LDL 158.  Radiology:   No results found.  Cardiac Studies:   Coronary stent in ? 2001 and restenosis and brachytherapy following year, no details available in EPIC.  Lower extremity arterial duplex 05/15/2018: Right mid SFA is occluded with distal reconstitution. No hemodynamically significant stenoses are identified in the left lower extremity arterial system. This exam reveals normal perfusion of the lower extremity (ABI 1.00 bilaterally).  Renal artery duplex  10/27/2019:  No evidence of renal artery occlusive disease in either renal artery.  Renal length is within normal limits for both kidneys.  Normal abdominal aorta flow velocities noted.  Echocardiogram 07/07/2020:  Normal LV systolic function with EF 55%. Left ventricle cavity is normal in size. Normal global wall motion. Doppler evidence of grade I (impaired) diastolic dysfunction. Calculated EF 55%. Left atrial cavity is mildly dilated. Native trileaflet aortic valve with no regurgitation. Mild aortic valve leaflet calcification. Mildly restricted aortic valve leaflets. No evidence of aortic valve stenosis. Structurally normal mitral valve.  Mild (Grade I) mitral regurgitation. Structurally normal tricuspid valve.  Mild tricuspid regurgitation. No evidence of pulmonary hypertension. Compared to the study done on 08/03/2019, grade 2 diastolic dysfunction not noted, moderate posterior MR is not appreciated. Tricuspid regurgitation previously was mild to moderate, now is mild with normal PA pressure, previously 32 mmHg.  Carotid artery duplex 12/11/2022: Duplex suggests stenosis in the right internal carotid artery (16-49%). Duplex suggests stenosis in the left internal carotid artery (50-69%). <50% stenosis in the left external carotid  artery. Antegrade right vertebral artery flow. Antegrade left vertebral artery flow. No significant change since 12/20/2021. Follow up in six months is appropriate if clinically indicated.    EKG   EKG 01/09/2023: Normal sinus rhythm at rate of  75 bpm, left atrial enlargement, normal axis.  Poor R progression, cannot exclude anteroseptal infarct old.  Frequent PACs in bigeminal pattern.  Compared to 01/10/2022, frequent PACs new.   EKG 01/10/2021: Normal sinus rhythm with rate of 74 bpm, left atrial enlargement, normal axis.  No evidence of ischemia.  No significant change from 01/12/2021, PACs not seen.  Medications and allergies   Allergies  Allergen Reactions   Amlodipine Besylate Other (See Comments)    Nightmares and nerve pain Nightmares and nerve pain   Atorvastatin Other (See Comments)    nightmare nightmare   Isosorbide Dinitrate Other (See Comments)    Nerve pain, lower back pain, feels out of it Nerve pain, lower back pain, feels out of it   Rosuvastatin Other (See Comments)    Nightmare.  Hence takes it during day    Current Outpatient Medications:    albuterol (PROVENTIL HFA;VENTOLIN HFA) 108 (90 Base) MCG/ACT inhaler, Inhale 2 puffs into the lungs as needed., Disp: , Rfl:    aspirin (ASPIRIN CHILDRENS) 81 MG chewable tablet, Chew 1 tablet (81 mg total) by mouth daily., Disp: , Rfl:    B Complex-Biotin-FA (SUPER B-50 COMPLEX PO), Take 1 capsule by mouth., Disp: , Rfl:    budesonide-formoterol (SYMBICORT) 160-4.5 MCG/ACT inhaler, Inhale into the lungs., Disp: , Rfl:    Cholecalciferol 50 MCG (2000 UT) CAPS, Take by mouth., Disp: , Rfl:    cyanocobalamin 2000 MCG tablet, Take 2,000 mcg by mouth. , Disp: , Rfl:    ezetimibe (ZETIA) 10 MG tablet, Take 10 mg by mouth daily., Disp: , Rfl:    ferrous sulfate 325 (65 FE) MG EC tablet, Take 325 mg by mouth 3 (three) times daily with meals., Disp: , Rfl:    hydrALAZINE (APRESOLINE) 50 MG tablet, Take by mouth 2 (two) times daily.,  Disp: , Rfl:    latanoprost (XALATAN) 0.005 % ophthalmic solution, Place 1 drop into both eyes at bedtime., Disp: , Rfl:    levothyroxine (SYNTHROID) 88 MCG tablet, Take 1 tablet (88 mcg total) by mouth daily before breakfast., Disp: , Rfl:    olmesartan-hydrochlorothiazide (BENICAR HCT) 40-12.5 MG tablet, Take 1 tablet by mouth daily. (Patient taking differently: Take 1 tablet by mouth daily. Taking in the morning), Disp: 90 tablet, Rfl: 2   Assessment     ICD-10-CM   1. Coronary artery disease involving native coronary artery of native heart without angina pectoris  I25.10 EKG 12-Lead    2. Asymptomatic bilateral carotid artery stenosis  I65.23 PCV CAROTID DUPLEX (BILATERAL)    3. White coat syndrome with diagnosis of hypertension  I10     4. Hypercholesteremia  E78.00     5. Statin myopathy  G72.0    T46.6X5A     6. Primary hypertension  I10       No orders of the defined types were placed in this encounter.   There are no discontinued medications.  Recommendations:   Brenda Barry  is a 87 y.o. African-American female with known coronary artery disease with remote coronary stenting 2001 and history of brachytherapy due to restenosis, difficult to control hypertension, asymptomatic bilateral carotid artery disease, PAD with both neurogenic claudication and vascular claudication, she has known right SFA occlusion, being managed medically, MGUS, stage III chronic kidney disease due to hypertension, hyperlipidemia. She is presently doing well and is annual office visit.    1. Coronary artery disease involving native coronary artery of native heart without angina pectoris Patient without recurrence  of angina pectoris, no change in the EKG except frequent PACs however this was noted previously as well. - EKG 12-Lead  2. Asymptomatic bilateral carotid artery stenosis She has asymptomatic bilateral carotid artery stenosis.  There is slight progression of carotid stenosis.  She is  presently not on a statin.  She is on aspirin 81 mg daily.   - PCV CAROTID DUPLEX (BILATERAL); Future  3. Primary hypertension Patient's blood pressure at home has been well-controlled around 120 to 130 mmHg systolic.  She has whitecoat hypertension.  She does not want to make any changes to medications for now.  4. White coat syndrome with diagnosis of hypertension   5. Hypercholesteremia Patient could not tolerate statins, she has tried both Crestor and Lipitor with severe myalgias and essentially use of debilitated.  I have discussed with her regarding starting Leqvio, prescription for Leqvio will be sent.  She is willing to proceed with this as it is once every 35-month dose and she is aware that she has to take it first month, third month and then every 6 months.  6.  Statin myopathy Patient unable to tolerate statins, presently on Zetia.  Otherwise stable from cardiac standpoint, I will see her back on annual basis.  She will continue to have 6 monthly surveillance of carotid arteries.    Yates Decamp, MD, [facility] 01/09/2023, 11:47 AM Office: [phone] Fax: [phone] Pager: [phone]

## 2023-01-10 ENCOUNTER — Ambulatory Visit (INDEPENDENT_AMBULATORY_CARE_PROVIDER_SITE_OTHER): Payer: Medicare Other | Admitting: Nurse Practitioner

## 2023-01-10 ENCOUNTER — Encounter: Payer: Self-pay | Admitting: Nurse Practitioner

## 2023-01-10 VITALS — BP 138/82 | HR 77 | Temp 97.3°F | Ht 66.5 in | Wt 213.0 lb

## 2023-01-10 DIAGNOSIS — E2839 Other primary ovarian failure: Secondary | ICD-10-CM

## 2023-01-10 DIAGNOSIS — N183 Chronic kidney disease, stage 3 unspecified: Secondary | ICD-10-CM

## 2023-01-10 DIAGNOSIS — I10 Essential (primary) hypertension: Secondary | ICD-10-CM

## 2023-01-10 DIAGNOSIS — G629 Polyneuropathy, unspecified: Secondary | ICD-10-CM | POA: Diagnosis not present

## 2023-01-10 DIAGNOSIS — I739 Peripheral vascular disease, unspecified: Secondary | ICD-10-CM

## 2023-01-10 DIAGNOSIS — M159 Polyosteoarthritis, unspecified: Secondary | ICD-10-CM

## 2023-01-10 DIAGNOSIS — E039 Hypothyroidism, unspecified: Secondary | ICD-10-CM

## 2023-01-10 DIAGNOSIS — F40240 Claustrophobia: Secondary | ICD-10-CM

## 2023-01-10 DIAGNOSIS — F419 Anxiety disorder, unspecified: Secondary | ICD-10-CM | POA: Diagnosis not present

## 2023-01-10 DIAGNOSIS — E782 Mixed hyperlipidemia: Secondary | ICD-10-CM

## 2023-01-10 NOTE — Patient Instructions (Addendum)
Please schedule awv AFTER 08/07/2022 (this was her last)    Dr Jacinto Halim talked to you about start Wilber Bihari   Would not recommend alcohol to skin- very drying

## 2023-01-10 NOTE — Progress Notes (Signed)
Careteam: Patient Care Team: Sharon Seller, NP as PCP - General (Geriatric Medicine) Helane Gunther, DPM as Consulting Physician (Podiatry) Yates Decamp, MD as Consulting Physician (Cardiology) Ernesto Rutherford, MD as Referring Physician (Ophthalmology) Estanislado Emms, MD as Consulting Physician (Nephrology)  PLACE OF SERVICE:  [facility]  Advanced Directive information Does Patient Have a Medical Advance Directive?: Yes, Type of Advance Directive: Healthcare Power of [place];Living will, Does patient want to make changes to medical advance directive?: No - Patient declined  Allergies  Allergen Reactions   Amlodipine Besylate Other (See Comments)    Nightmares and nerve pain Nightmares and nerve pain   Atorvastatin Other (See Comments)    nightmare nightmare   Isosorbide Dinitrate Other (See Comments)    Nerve pain, lower back pain, feels out of it Nerve pain, lower back pain, feels out of it   Rosuvastatin Other (See Comments)    Nightmare.  Hence takes it during day    Chief Complaint  Patient presents with   Establish Care    New patient to establish care. Pill bottles present at initial appointment, with the exception of OTC medications and inhaler. Patient with ongoing leg pain and numbness in toes.      HPI: Patient is a 87 y.o. female  to establish care.   Last AWV was 08/07/2022  Asthma- had a recent change in medication not using symbicort, will bring medication up here to verify. Will use albuterol maybe once every 2 weeks.  Overall asthma is controlled, occasionally will have wheezing.   CAD- on ASA   Vit d def- on supplement.   Hyperlipidemia- on zetia 10 mg daily  Pernicious Anemia- on b12 supplement  Also on iron supplement for "being tired"  Iron levels WNL on last labs.  Hypertension- taking hydralazine 50 mg BID- was taking TID but made her really sleeping so had to adjust. Also on olmesartan-hct   Has glaucoma followed by Dr Dione Booze.    Hypothyroid- on synthroid 88 mcg daily- lowered in Jan due to level being low  Only eats twice daily, does not eat as much as she used to. Eats breakfast around 10 then last meal of the day around 1-3 .  Goes to bed early, get in the bed at 7 and falls to sleep at 9.  She has a daughter that had mental illness (schizophrenia) she has lived with her since 2021 and she has to stay alert.   She has a low tolerance for a lot of medication. Afraid to start or change medication for that.   Reports extreme claustrophobic.  Review of Systems:  Review of Systems  Constitutional:  Negative for chills, fever and weight loss.  HENT:  Negative for tinnitus.   Respiratory:  Positive for wheezing. Negative for cough, sputum production and shortness of breath.   Cardiovascular:  Negative for chest pain, palpitations and leg swelling.  Gastrointestinal:  Negative for abdominal pain, constipation, diarrhea and heartburn.  Genitourinary:  Negative for dysuria, frequency and urgency.  Musculoskeletal:  Positive for joint pain. Negative for back pain, falls and myalgias.  Skin: Negative.   Neurological:  Positive for tingling. Negative for dizziness and headaches.  Psychiatric/Behavioral:  Negative for depression and memory loss. The patient is nervous/anxious. The patient does not have insomnia.     Past Medical History:  Diagnosis Date   Anemia    Per [facility] New Patient Packet   Asthma    Per [facility] New Patient Packet  Bilateral carotid bruits 02/12/2019   Coronary artery disease    Glaucoma    Per [facility] New Patient Packet   H/O bone density study    Per [facility] New Patient Packet   H/O heart artery stent    Per [facility] New Patient Packet   H/O mammogram    Per [facility] New Patient Packet   Heart murmur    Per [facility] New Patient Packet   Hyperlipidemia    Hypertension    Hypothyroid    Per [facility] New Patient Packet   Nerve pain    Per [facility] New Patient Packet   Peripheral artery disease (HCC) 02/12/2019    Prediabetes    Per [facility] New Patient Packet   Shingles    Per [facility] New Patient Packet   Stage 3 chronic kidney disease (HCC)    Per [facility] New Patient Packet   Past Surgical History:  Procedure Laterality Date   BREAST LUMPECTOMY     1980's Per [facility] New Patient Packet   CATARACT EXTRACTION     2018, Per [facility] New Patient Packet   COLONOSCOPY     Per [facility] New Patient Packet   HIP SURGERY Right 10/05/2018   TONSILLECTOMY     1950's Per [facility] New Patient Packet   Social History:   reports that she quit smoking about 51 years ago. Her smoking use included cigarettes. She has a 1.75 pack-year smoking history. She has never used smokeless tobacco. She reports that she does not drink alcohol and does not use drugs.  Family History  Problem Relation Age of Onset   Tuberculosis Mother    Heart disease Father    Acne Daughter     Medications: Patient's Medications  New Prescriptions   No medications on file  Previous Medications   ALBUTEROL (PROVENTIL HFA;VENTOLIN HFA) 108 (90 BASE) MCG/ACT INHALER    Inhale 2 puffs into the lungs as needed.   ASPIRIN (ASPIRIN CHILDRENS) 81 MG CHEWABLE TABLET    Chew 1 tablet (81 mg total) by mouth daily.   BUDESONIDE-FORMOTEROL (SYMBICORT) 160-4.5 MCG/ACT INHALER    Inhale 2 puffs into the lungs 2 (two) times daily as needed.   CHOLECALCIFEROL 50 MCG (2000 UT) CAPS    Take 1 capsule by mouth daily.   CYANOCOBALAMIN (VITAMIN B12) 1000 MCG TABLET    Take 1,000 mcg by mouth daily.   EZETIMIBE (ZETIA) 10 MG TABLET    Take 10 mg by mouth daily.   FERROUS SULFATE 325 (65 FE) MG EC TABLET    Take 325 mg by mouth 2 (two) times daily.   HYDRALAZINE (APRESOLINE) 50 MG TABLET    Take 50 mg by mouth 2 (two) times daily.   KETOTIFEN (ZADITOR) 0.035 % OPHTHALMIC SOLUTION    1 drop as needed.   LATANOPROST (XALATAN) 0.005 % OPHTHALMIC SOLUTION    Place 1 drop into both eyes at bedtime.   LEVOTHYROXINE (SYNTHROID) 88 MCG TABLET    Take 88 mcg by mouth daily before breakfast.    OLMESARTAN-HYDROCHLOROTHIAZIDE (BENICAR HCT) 40-12.5 MG TABLET    Take 1 tablet by mouth daily.  Modified Medications   No medications on file  Discontinued Medications   B COMPLEX-BIOTIN-FA (SUPER B-50 COMPLEX PO)    Take 1 capsule by mouth.   CYANOCOBALAMIN 2000 MCG TABLET    Take 2,000 mcg by mouth.    LEVOTHYROXINE (SYNTHROID) 100 MCG TABLET    Take 100 mcg by mouth daily before breakfast.   LEVOTHYROXINE (SYNTHROID) 88 MCG  TABLET    Take 1 tablet (88 mcg total) by mouth daily before breakfast.    Physical Exam:  Vitals:   01/10/23 0903  BP: 138/82  Pulse: 77  Temp: (!) 97.3 F (36.3 C)  TempSrc: Temporal  SpO2: 98%  Weight: 213 lb (96.6 kg)  Height: 5' 6.5" (1.689 m)   Body mass index is 33.86 kg/m. Wt Readings from Last 3 Encounters:  01/10/23 213 lb (96.6 kg)  01/09/23 213 lb (96.6 kg)  04/08/22 216 lb 12.8 oz (98.3 kg)    Physical Exam Constitutional:      General: She is not in acute distress.    Appearance: She is well-developed. She is not diaphoretic.  HENT:     Head: Normocephalic and atraumatic.     Mouth/Throat:     Pharynx: No oropharyngeal exudate.  Eyes:     Conjunctiva/sclera: Conjunctivae normal.     Pupils: Pupils are equal, round, and reactive to light.  Cardiovascular:     Rate and Rhythm: Normal rate and regular rhythm.     Heart sounds: Normal heart sounds.  Pulmonary:     Effort: Pulmonary effort is normal.     Breath sounds: Normal breath sounds.  Abdominal:     General: Bowel sounds are normal.     Palpations: Abdomen is soft.  Musculoskeletal:     Cervical back: Normal range of motion and neck supple.     Right lower leg: No edema.     Left lower leg: No edema.  Skin:    General: Skin is warm and dry.  Neurological:     Mental Status: She is alert and oriented to person, place, and time.     Gait: Gait normal.  Psychiatric:        Mood and Affect: Mood normal.    Labs reviewed: Basic Metabolic Panel: Recent Labs     04/01/22 1044 10/08/22 1014  NA 140 141  K 3.4* 4.0  CL 106 106  CO2 30 30  GLUCOSE 117* 92  BUN 18 18  CREATININE 1.37* 1.31*  CALCIUM 9.7 9.8   Liver Function Tests: Recent Labs    04/01/22 1044 10/08/22 1014  AST 16 22  ALT 10 16  ALKPHOS 55 59  BILITOT 0.5 0.4  PROT 7.2 7.3  ALBUMIN 3.9 4.0   No results for input(s): "LIPASE", "AMYLASE" in the last 8760 hours. No results for input(s): "AMMONIA" in the last 8760 hours. CBC: Recent Labs    04/01/22 1044 10/08/22 1014  WBC 6.8 5.7  NEUTROABS 2.9 2.4  HGB 11.4* 11.6*  HCT 34.7* 35.1*  MCV 88.3 88.6  PLT 273 261   Lipid Panel: No results for input(s): "CHOL", "HDL", "LDLCALC", "TRIG", "CHOLHDL", "LDLDIRECT" in the last 8760 hours. TSH: No results for input(s): "TSH" in the last 8760 hours. A1C: No results found for: "HGBA1C"   Assessment/Plan 1. Acquired hypothyroidism Continues on synthroid 88 mcg- was reduced in January from 100 mcg - TSH  2. Neuropathy -ongoing, stable at this time  3. Stage 3 chronic kidney disease, unspecified whether stage 3a or 3b CKD (HCC) -Chronic and stable Encourage proper hydration Follow metabolic panel Avoid nephrotoxic meds (NSAIDS) - BASIC METABOLIC PANEL WITH GFR  4. Mixed hyperlipidemia -continues on zetia, managed by cardiology - Lipid panel  5. Peripheral artery disease (HCC) -stable, continues on ASA  6. Essential hypertension -Blood pressure well controlled, goal bp <140/90 Continue current medications and dietary modifications follow metabolic panel  7. Primary osteoarthritis  involving multiple joints -pain in knees, continues on tylenol PRN.   8. Claustrophobia -continues to make lifestyle changes   9. Anxiety Feels like she is able to cope with lifestyle modifications.   10. Estrogen deficiency - DG Bone Density; Future   Return in about 4 months (around 05/12/2023) for routine follow up .  Janene Harvey. [facility] [phone]

## 2023-01-11 LAB — LIPID PANEL
Cholesterol: 243 mg/dL — ABNORMAL HIGH (ref <200)
HDL: 76 mg/dL (ref >=50)
LDL Cholesterol (Calc): 149 mg/dL (calc) — ABNORMAL HIGH
Non-HDL Cholesterol (Calc): 167 mg/dL (calc) — ABNORMAL HIGH (ref <130)
Total CHOL/HDL Ratio: 3.2 (calc) (ref <5.0)
Triglycerides: 79 mg/dL (ref <150)

## 2023-01-11 LAB — BASIC METABOLIC PANEL WITH GFR
BUN/Creatinine Ratio: 20 (calc) (ref 6–22)
BUN: 24 mg/dL (ref 7–25)
CO2: 29 mmol/L (ref 20–32)
Calcium: 10.2 mg/dL (ref 8.6–10.4)
Chloride: 104 mmol/L (ref 98–110)
Creat: 1.23 mg/dL — ABNORMAL HIGH (ref 0.60–0.95)
Glucose, Bld: 88 mg/dL (ref 65–99)
Potassium: 4.7 mmol/L (ref 3.5–5.3)
Sodium: 141 mmol/L (ref 135–146)
eGFR: 43 mL/min/{1.73_m2} — ABNORMAL LOW (ref >=60)

## 2023-01-11 LAB — TSH: TSH: 0.92 mIU/L (ref 0.40–4.50)

## 2023-01-13 NOTE — Progress Notes (Signed)
Shanda Bumps, will manage this, thank you for keeping me in the loop. Starting [employer]

## 2023-01-14 ENCOUNTER — Other Ambulatory Visit: Payer: Self-pay

## 2023-01-15 ENCOUNTER — Other Ambulatory Visit: Payer: Self-pay

## 2023-01-21 LAB — HM DEXA SCAN

## 2023-01-22 ENCOUNTER — Telehealth: Payer: Self-pay | Admitting: Pharmacy Technician

## 2023-01-22 NOTE — Telephone Encounter (Signed)
Thank you so much for the update.

## 2023-01-22 NOTE — Telephone Encounter (Addendum)
 Dr. Jacinto Halim, [place] note:  Patient will be scheduled as soon as possible.  Auth Submission: NO AUTH NEEDED Site of care: Site of care: [facility] Payer: MEDICARE A/B & BCBS FEP Medication & CPT/J Code(s) submitted: Leqvio (Inclisiran) [phone] Route of submission (phone, fax, portal):  Phone # Fax # Auth type: Buy/Bill Units/visits requested: X2 Reference number: [id number] - [employer]BS Approval from: 01/22/23 to 08/21/24

## 2023-01-24 ENCOUNTER — Other Ambulatory Visit: Payer: Self-pay

## 2023-01-24 MED ORDER — LEVOTHYROXINE SODIUM 88 MCG PO TABS: 88.0000 ug | ORAL_TABLET | Freq: Every day | ORAL | 1 refills | Status: DC

## 2023-01-24 MED ORDER — BUDESONIDE-FORMOTEROL FUMARATE 160-4.5 MCG/ACT IN AERO: 2.0000 | INHALATION_SPRAY | Freq: Two times a day (BID) | RESPIRATORY_TRACT | 2 refills | Status: DC

## 2023-01-27 ENCOUNTER — Other Ambulatory Visit: Payer: Self-pay

## 2023-01-27 DIAGNOSIS — D510 Vitamin B12 deficiency anemia due to intrinsic factor deficiency: Secondary | ICD-10-CM

## 2023-01-27 DIAGNOSIS — D472 Monoclonal gammopathy: Secondary | ICD-10-CM

## 2023-01-27 MED ORDER — FERROUS SULFATE 325 (65 FE) MG PO TBEC
325.0000 mg | DELAYED_RELEASE_TABLET | Freq: Two times a day (BID) | ORAL | 2 refills | Status: DC
Start: 2023-01-27 — End: 2023-04-14

## 2023-01-28 ENCOUNTER — Telehealth: Payer: Self-pay

## 2023-01-28 NOTE — Telephone Encounter (Signed)
PA request received for Brenda Barry. PA initiated through covermymeds. Waiting on reply  Information regarding your request [facility] [employer] is not able to process this request through ePA, please contact the plan at [phone] or fax in request to [phone]. NOTICE FOR OPIOID REQUESTS ONLY: Effective Jul 22, 2017 Medicare Part D plans have implemented new opioid safety edits, including a 7 day supply limit for opioid naive patients.  Tried calling waited on phone for 10 minutes with no answer. Will need to try again later.

## 2023-02-03 ENCOUNTER — Ambulatory Visit (INDEPENDENT_AMBULATORY_CARE_PROVIDER_SITE_OTHER): Payer: Medicare Other

## 2023-02-03 VITALS — BP 128/78 | HR 59 | Temp 97.8°F | Resp 18 | Ht 65.5 in | Wt 210.2 lb

## 2023-02-03 DIAGNOSIS — E782 Mixed hyperlipidemia: Secondary | ICD-10-CM | POA: Diagnosis not present

## 2023-02-03 MED ORDER — INCLISIRAN SODIUM 284 MG/1.5ML ~~LOC~~ SOSY
284.0000 mg | PREFILLED_SYRINGE | Freq: Once | SUBCUTANEOUS | Status: AC
  Administered 2023-02-03: 284 mg via SUBCUTANEOUS
  Filled 2023-02-03: qty 1.5

## 2023-02-03 NOTE — Patient Instructions (Signed)
Inclisiran Injection What is this medication? INCLISIRAN (in kli SIR an) treats high cholesterol. It works by decreasing bad cholesterol (such as LDL) in your blood. Changes to diet and exercise are often combined with this medication. This medicine may be used for other purposes; ask your health care provider or pharmacist if you have questions. COMMON BRAND NAME(S): LEQVIO What should I tell my care team before I take this medication? They need to know if you have any of these conditions: An unusual or allergic reaction to inclisiran, other medications, foods, dyes, or preservatives Pregnant or trying to get pregnant Breast-feeding How should I use this medication? This medication is injected under the skin. It is given by your care team in a hospital or clinic setting. Talk to your care team about the use of this medication in children. Special care may be needed. Overdosage: If you think you have taken too much of this medicine contact a poison control center or emergency room at once. NOTE: This medicine is only for you. Do not share this medicine with others. What if I miss a dose? Keep appointments for follow-up doses. It is important not to miss your dose. Call your care team if you are unable to keep an appointment. What may interact with this medication? Interactions are not expected. This list may not describe all possible interactions. Give your health care provider a list of all the medicines, herbs, non-prescription drugs, or dietary supplements you use. Also tell them if you smoke, drink alcohol, or use illegal drugs. Some items may interact with your medicine. What should I watch for while using this medication? Visit your care team for regular checks on your progress. Tell your care team if your symptoms do not start to get better or if they get worse. You may need blood work while you are taking this medication. What side effects may I notice from receiving this  medication? Side effects that you should report to your care team as soon as possible: Allergic reactions--skin rash, itching, hives, swelling of the face, lips, tongue, or throat Side effects that usually do not require medical attention (report these to your care team if they continue or are bothersome): Joint pain Pain, redness, or irritation at injection site This list may not describe all possible side effects. Call your doctor for medical advice about side effects. You may report side effects to FDA at 1-800-FDA-1088. Where should I keep my medication? This medication is given in a hospital or clinic. It will not be stored at home. NOTE: This sheet is a summary. It may not cover all possible information. If you have questions about this medicine, talk to your doctor, pharmacist, or health care provider.  2024 Elsevier/Gold Standard (2022-09-15 00:00:00)  

## 2023-02-03 NOTE — Progress Notes (Signed)
Diagnosis: Hyperlipidemia  Provider:  Mannam, Praveen MD  Procedure: Injection  Leqvio (inclisiran), Dose: 284 mg, Site: subcutaneous, Number of injections: 1  Post Care: Observation period completed  Discharge: Condition: Good, Destination: Home . AVS Provided  Performed by:  Sabitri  Ranabhat, RN       

## 2023-02-05 ENCOUNTER — Telehealth: Payer: Medicare Other

## 2023-02-05 ENCOUNTER — Telehealth: Payer: Self-pay

## 2023-02-05 DIAGNOSIS — J4540 Moderate persistent asthma, uncomplicated: Secondary | ICD-10-CM

## 2023-02-05 MED ORDER — FLUTICASONE FUROATE-VILANTEROL 200-25 MCG/ACT IN AEPB: 1.0000 | INHALATION_SPRAY | Freq: Every day | RESPIRATORY_TRACT | 11 refills | Status: DC

## 2023-02-05 NOTE — Telephone Encounter (Signed)
Mychart message sent to patient with providers response. Awaiting for a reply

## 2023-02-05 NOTE — Telephone Encounter (Signed)
Breo Ellipta ordered.

## 2023-02-05 NOTE — Telephone Encounter (Signed)
Patient to verify with pharmacy which alternative is covered by insurance.

## 2023-02-05 NOTE — Telephone Encounter (Signed)
Patient called to say that the Jerral Ralph is not covered by her insurance and she needs an alternative. Patient would like the alternative sent to [facility] on [address].  Please advise

## 2023-02-05 NOTE — Telephone Encounter (Signed)
Patient called and stated that she is currently on ezetimibe and was given her first Leqvio injection this past Monday and wants to know if she needs to continue taking the Ezetimibe.

## 2023-02-07 ENCOUNTER — Other Ambulatory Visit: Payer: Self-pay

## 2023-02-07 ENCOUNTER — Telehealth: Payer: Self-pay

## 2023-02-07 ENCOUNTER — Other Ambulatory Visit: Payer: Self-pay | Admitting: Family

## 2023-02-07 DIAGNOSIS — J4540 Moderate persistent asthma, uncomplicated: Secondary | ICD-10-CM

## 2023-02-07 MED ORDER — FLUTICASONE-SALMETEROL 100-50 MCG/ACT IN AEPB: 1.0000 | INHALATION_SPRAY | Freq: Two times a day (BID) | RESPIRATORY_TRACT | 1 refills | Status: DC

## 2023-02-07 NOTE — Telephone Encounter (Signed)
error 

## 2023-02-07 NOTE — Telephone Encounter (Signed)
Discontinue Breo as requested due to cost  Start on Wixela inhub 100 mcg /50 mcg inhaler one puff script send to pharmacy.

## 2023-02-07 NOTE — Telephone Encounter (Signed)
Patient called stating that the prescription for Brenda Barry is too expensive $(32.13/month) she would like to have the Wixela called in as it will cost her $5 for a 90 day supply (after researching on the computer).   Message sent to Richarda Blade, NP

## 2023-02-07 NOTE — Telephone Encounter (Signed)
Patient returned call to the office to disucuss her being denied for Brenda Barry medication. Patient states she will give the pharmacy a call after lunch to see the reason it was denied.

## 2023-02-10 ENCOUNTER — Telehealth: Payer: Self-pay

## 2023-02-10 NOTE — Transitions of Care (Post Inpatient/ED Visit) (Signed)
   02/10/2023  Name: Brenda Barry MRN: [id number] DOB: 14-Aug-1935  Today's TOC FU Call Status:    Transition Care Management Follow-up Telephone Call    Items Reviewed:    Medications Reviewed Today: Medications Reviewed Today     Reviewed by Maurice Small, CMA (Certified Medical Assistant) on 01/10/23 at 1145  Med List Status: <None>   Medication Order Taking? Sig Documenting Provider Last Dose Status Informant  albuterol (PROVENTIL HFA;VENTOLIN HFA) 108 (90 Base) MCG/ACT inhaler [id number] Yes Inhale 2 puffs into the lungs as needed. [provider] Taking Active Self  aspirin (ASPIRIN CHILDRENS) 81 MG chewable tablet [id number] Yes Chew 1 tablet (81 mg total) by mouth daily. Yates Decamp, MD Taking Active   budesonide-formoterol [facility]) 160-4.5 MCG/ACT inhaler [id number] Yes Inhale 2 puffs into the lungs 2 (two) times daily. [provider] Taking Active   Cholecalciferol 50 MCG (2000 UT) CAPS [id number] Yes Take 1 capsule by mouth daily. [provider] Taking Active   cyanocobalamin (VITAMIN B12) 1000 MCG tablet [id number] Yes Take 1,000 mcg by mouth daily. [provider] Taking Active   ezetimibe (ZETIA) 10 MG tablet [id number] Yes Take 10 mg by mouth daily. [provider] Taking Active   ferrous sulfate 325 (65 FE) MG EC tablet [id number] Yes Take 325 mg by mouth 2 (two) times daily. [provider] Taking Active   hydrALAZINE (APRESOLINE) 50 MG tablet [id number] Yes Take 50 mg by mouth 2 (two) times daily. [provider] Taking Active   ketotifen (ZADITOR) 0.035 % ophthalmic solution [id number] Yes 1 drop as needed. [provider] Taking Active   latanoprost (XALATAN) 0.005 % ophthalmic solution [id number] Yes Place 1 drop into both eyes at bedtime. [provider] Taking Active Self  levothyroxine (SYNTHROID) 88 MCG tablet [id number] Yes Take 88 mcg by mouth daily before breakfast. [provider] Taking Active   olmesartan-hydrochlorothiazide (BENICAR HCT) 40-12.5 MG tablet [id number] Yes Take 1 tablet by mouth daily. Yates Decamp, MD Taking Active             Home Care and Equipment/Supplies:    Functional Questionnaire:    Follow up appointments reviewed:  Spoke with patient and there is a mix up in her chart and she has not been hospitalized. Ticket was sent into [facility] for this to get fixed. Ticket # : [id number] Chart and tasks have now been taken care of and closed. Nothing else follows at this time    SIGNATURE: Guss Bunde [facility]

## 2023-02-28 ENCOUNTER — Telehealth: Payer: Self-pay

## 2023-02-28 NOTE — Telephone Encounter (Signed)
I would check lipid panel in 3 months. If LDL down, then can stop Zetia. Ill copy Dr. Jacinto Halim on this in case he has other thoughts. He can order lipid panel after back so that he will get the results.  Thanks MJP

## 2023-02-28 NOTE — Telephone Encounter (Signed)
Called and spoke to patient she voiced understanding

## 2023-03-14 ENCOUNTER — Telehealth: Payer: Self-pay

## 2023-03-14 ENCOUNTER — Ambulatory Visit (INDEPENDENT_AMBULATORY_CARE_PROVIDER_SITE_OTHER): Payer: Medicare Other | Admitting: Podiatry

## 2023-03-14 ENCOUNTER — Encounter: Payer: Self-pay | Admitting: Podiatry

## 2023-03-14 DIAGNOSIS — N183 Chronic kidney disease, stage 3 unspecified: Secondary | ICD-10-CM

## 2023-03-14 DIAGNOSIS — B351 Tinea unguium: Secondary | ICD-10-CM

## 2023-03-14 DIAGNOSIS — M79674 Pain in right toe(s): Secondary | ICD-10-CM

## 2023-03-14 DIAGNOSIS — M79675 Pain in left toe(s): Secondary | ICD-10-CM

## 2023-03-14 NOTE — Progress Notes (Signed)
This patient returns to my office for at risk foot care.  This patient requires this care by a professional since this patient will be at risk due to having chronic kidney disease and PAD..  This patient is unable to cut nails herself since the patient cannot reach her nails.These nails are painful walking and wearing shoes.  This patient presents for at risk foot care today.    General Appearance  Alert, conversant and in no acute stress.  Vascular  Dorsalis pedis and posterior tibial  pulses are palpable  bilaterally.  Capillary return is within normal limits  bilaterally. Temperature is within normal limits  bilaterally.  Neurologic  Senn-Weinstein monofilament wire test within normal limits  bilaterally. Muscle power within normal limits bilaterally.  Nails Thick disfigured discolored nails with subungual debris  from hallux to fifth toes bilaterally. No evidence of bacterial infection or drainage bilaterally.  Orthopedic  No limitations of motion  feet .  No crepitus or effusions noted.  No bony pathology or digital deformities noted.  HAV  B/L.  Skin  normotropic skin with no porokeratosis noted bilaterally.  No signs of infections or ulcers noted.   Asymptomatic HD 5th B/L.  Onychomycosis  Pain in right toes  Pain in left toes  Consent was obtained for treatment procedures.   Mechanical debridement of nails 1-5  bilaterally performed with a nail nipper.  Filed with dremel without incident.    Return office visit   3 months                   Told patient to return for periodic foot care and evaluation due to potential at risk complications.   Gregory Mayer DPM  

## 2023-03-14 NOTE — Telephone Encounter (Signed)
Patient called and left message on clinical intake voicemail. She is requesting a different medication for her asthma. She states that that current medication fluticasone (Wixela) doesn't seem to be helping. She states that it seems to make her tinnitus louder, her blood pressure is elevated and her breathing is no better. She states that last reaction she had was her tongue swelling. Please advise.  Message sent to Abbey Chatters, NP

## 2023-03-17 ENCOUNTER — Other Ambulatory Visit: Payer: Self-pay

## 2023-03-17 ENCOUNTER — Other Ambulatory Visit: Payer: Self-pay | Admitting: Nurse Practitioner

## 2023-03-17 ENCOUNTER — Telehealth: Payer: Self-pay

## 2023-03-17 MED ORDER — OLMESARTAN MEDOXOMIL-HCTZ 40-12.5 MG PO TABS: 1.0000 | ORAL_TABLET | Freq: Every day | ORAL | 1 refills | Status: DC

## 2023-03-17 NOTE — Telephone Encounter (Signed)
Patient requesting refill on olmesartan-hydrochlorothiazide. Medication was last filled by Dr. Jacinto Halim. Is medication ok to fill or does patient need to contact Dr. Verl Dicker office.

## 2023-03-17 NOTE — Telephone Encounter (Signed)
We can fill

## 2023-03-17 NOTE — Telephone Encounter (Signed)
Medication sent to pharmacy  

## 2023-03-17 NOTE — Telephone Encounter (Signed)
Patient requesting refill on medication.

## 2023-03-17 NOTE — Telephone Encounter (Signed)
Please have her make appt to follow up and discuss this.

## 2023-03-18 ENCOUNTER — Telehealth: Payer: Self-pay

## 2023-03-18 NOTE — Telephone Encounter (Signed)
Would not recommend using medication that caused tongue swelling (and please add to allergies)  Okay to use albuterol for shortness of breath/wheezing

## 2023-03-18 NOTE — Telephone Encounter (Signed)
I spoke with patient and informed her of response from Deputy. She verbalized her understanding and agreed.

## 2023-03-18 NOTE — Telephone Encounter (Signed)
Patient called and stated that she's is currently on ezetimibe ans was given her first Leqvio injection this past Monday and wants to know if she needs to keep taking Ezetimibe. Please advise

## 2023-03-18 NOTE — Telephone Encounter (Addendum)
Patient has made appointment to discuss this,but she states that she has been using her rescue inhaler and wants to know if this is okay to do.

## 2023-03-18 NOTE — Telephone Encounter (Signed)
She can stop Zetia

## 2023-03-25 ENCOUNTER — Ambulatory Visit (INDEPENDENT_AMBULATORY_CARE_PROVIDER_SITE_OTHER): Payer: Medicare Other | Admitting: Family

## 2023-03-25 ENCOUNTER — Encounter: Payer: Self-pay | Admitting: Family

## 2023-03-25 VITALS — BP 150/92 | HR 63 | Temp 98.5°F | Resp 16 | Ht 65.0 in | Wt 213.0 lb

## 2023-03-25 DIAGNOSIS — J4540 Moderate persistent asthma, uncomplicated: Secondary | ICD-10-CM

## 2023-03-25 MED ORDER — MONTELUKAST SODIUM 10 MG PO TABS: 10.0000 mg | ORAL_TABLET | Freq: Every day | ORAL | 3 refills | Status: DC

## 2023-03-25 MED ORDER — BUDESONIDE-FORMOTEROL FUMARATE 160-4.5 MCG/ACT IN AERO: 2.0000 | INHALATION_SPRAY | Freq: Two times a day (BID) | RESPIRATORY_TRACT | 3 refills | Status: DC

## 2023-03-25 NOTE — Progress Notes (Signed)
Provider: Ramia Sidney FNP-C  Sharon Seller, NP  Patient Care Team: Sharon Seller, NP as PCP - General (Geriatric Medicine) Helane Gunther, DPM as Consulting Physician (Podiatry) Yates Decamp, MD as Consulting Physician (Cardiology) Ernesto Rutherford, MD as Referring Physician (Ophthalmology) Estanislado Emms, MD as Consulting Physician (Nephrology)  Extended Emergency Contact Information Primary Emergency Contact: Sherryle Lis of [place] Home Phone: [phone] Mobile Phone: [phone] Relation: Daughter Secondary Emergency Contact: Streeter,Maureen Mobile Phone: [phone] Relation: Daughter Preferred language: English Interpreter needed? No  Code Status:  Full Code  Goals of care: Advanced Directive information    03/25/2023    3:51 PM  Advanced Directives  Does Patient Have a Medical Advance Directive? Yes  Type of Estate agent of [place];Living will;Out of facility DNR (pink MOST or yellow form)  Does patient want to make changes to medical advance directive? No - Patient declined  Copy of Healthcare Power of Attorney in Chart? No - copy requested     Chief Complaint  Patient presents with   Follow-up    Discuss asthma medication Wiexla     HPI:  Pt is a 87 y.o. female seen today for an acute visit for evaluation of asthma medication  States medication makes her feel bad changed her mood,agitation,with strong body odor and unsteady gait.It worsen her tinnitus.symptoms improved when she stopped. Has had similar symptoms with Prednisone.  States has used Budesonide - Formeterol 160 -45 mcg/inhlaer which helped with her symptoms but insurance stopped covering. Also took [place] which also worked well for her but was not covered.     Past Medical History:  Diagnosis Date   Anemia    Per [facility] New Patient Packet   Asthma    Per [facility] New Patient Packet   Bilateral carotid bruits 02/12/2019   Coronary artery disease     Glaucoma    Per [facility] New Patient Packet   H/O bone density study    Per [facility] New Patient Packet   H/O heart artery stent    Per [facility] New Patient Packet   H/O mammogram    Per [facility] New Patient Packet   Heart murmur    Per [facility] New Patient Packet   Hyperlipidemia    Hypertension    Hypothyroid    Per [facility] New Patient Packet   Nerve pain    Per [facility] New Patient Packet   Peripheral artery disease (HCC) 02/12/2019   Prediabetes    Per [facility] New Patient Packet   Shingles    Per [facility] New Patient Packet   Stage 3 chronic kidney disease (HCC)    Per [facility] New Patient Packet   Past Surgical History:  Procedure Laterality Date   BREAST LUMPECTOMY     1980's Per [facility] New Patient Packet   CATARACT EXTRACTION     2018, Per [facility] New Patient Packet   COLONOSCOPY     Per [facility] New Patient Packet   HIP SURGERY Right 10/05/2018   TONSILLECTOMY     1950's Per [facility] New Patient Packet    Allergies  Allergen Reactions   Amlodipine Besylate Other (See Comments)    Nightmares and nerve pain Nightmares and nerve pain   Atorvastatin Other (See Comments)    nightmare nightmare   Isosorbide Dinitrate Other (See Comments)    Nerve pain, lower back pain, feels out of it Nerve pain, lower back pain, feels out of it   [employer] [Fluticasone Furoate-Vilanterol] Other (See Comments)  Tongue swelling   Rosuvastatin Other (See Comments)    Nightmare.  Hence takes it during day    Outpatient Encounter Medications as of 03/25/2023  Medication Sig   albuterol (PROVENTIL HFA;VENTOLIN HFA) 108 (90 Base) MCG/ACT inhaler Inhale 2 puffs into the lungs as needed.   aspirin (ASPIRIN CHILDRENS) 81 MG chewable tablet Chew 1 tablet (81 mg total) by mouth daily.   Cholecalciferol 50 MCG (2000 UT) CAPS Take 1 capsule by mouth daily.   cyanocobalamin (VITAMIN B12) 1000 MCG tablet Take 1,000 mcg by mouth daily.   ezetimibe (ZETIA) 10 MG tablet Take 10 mg by mouth daily.   ferrous sulfate 325 (65 FE) MG EC tablet Take 1  tablet (325 mg total) by mouth 2 (two) times daily.   fluticasone-salmeterol (WIXELA INHUB) 100-50 MCG/ACT AEPB Inhale 1 puff into the lungs 2 (two) times daily.   hydrALAZINE (APRESOLINE) 50 MG tablet Take 50 mg by mouth 2 (two) times daily.   inclisiran (LEQVIO) 284 MG/1.5ML SOSY injection Inject 284 mg into the skin every 6 (six) months.   ketotifen (ZADITOR) 0.035 % ophthalmic solution 1 drop as needed.   latanoprost (XALATAN) 0.005 % ophthalmic solution Place 1 drop into both eyes at bedtime.   levothyroxine (SYNTHROID) 88 MCG tablet Take 1 tablet (88 mcg total) by mouth daily before breakfast.   olmesartan-hydrochlorothiazide (BENICAR HCT) 40-12.5 MG tablet Take 1 tablet by mouth daily.   No facility-administered encounter medications on file as of 03/25/2023.    Review of Systems  Constitutional:  Negative for appetite change, chills, fatigue, fever and unexpected weight change.  HENT:  Negative for congestion, dental problem, ear discharge, ear pain, facial swelling, hearing loss, nosebleeds, postnasal drip, rhinorrhea, sinus pressure, sinus pain, sneezing, sore throat, tinnitus and trouble swallowing.   Eyes:  Negative for pain, discharge, redness, itching and visual disturbance.  Respiratory:  Negative for cough, chest tightness, shortness of breath and wheezing.   Cardiovascular:  Negative for chest pain, palpitations and leg swelling.  Gastrointestinal:  Negative for abdominal distention, abdominal pain, diarrhea, nausea and vomiting.  Musculoskeletal:  Negative for arthralgias, back pain, gait problem, joint swelling, myalgias, neck pain and neck stiffness.  Skin:  Negative for color change, pallor, rash and wound.  Neurological:  Negative for dizziness, syncope, speech difficulty, weakness, light-headedness, numbness and headaches.  Hematological:  Does not bruise/bleed easily.  Psychiatric/Behavioral:  Negative for agitation, behavioral problems, confusion, hallucinations and  sleep disturbance. The patient is not nervous/anxious.     Immunization History  Administered Date(s) Administered   Influenza-Unspecified 05/15/1992, 05/03/1993, 05/17/1994, 05/19/1995, 05/07/1996, 05/13/1997, 05/08/1998, 05/18/1999, 04/30/2003   Moderna Sars-Covid-2 Vaccination 08/16/2019, 09/06/2019   Pneumococcal Conjugate-13 07/23/2015   Pneumococcal Polysaccharide-23 07/23/2013   Pertinent  Health Maintenance Due  Topic Date Due   INFLUENZA VACCINE  02/20/2023   DEXA SCAN  Completed      09/28/2019    9:22 AM 09/26/2020    9:55 AM 10/01/2021   10:59 AM 01/10/2023    9:09 AM 03/25/2023    3:51 PM  Fall Risk  Falls in the past year?    0 0  Was there an injury with Fall?    0 0  Fall Risk Category Calculator    0 0  (RETIRED) Patient Fall Risk Level Low fall risk Low fall risk Low fall risk    Patient at Risk for Falls Due to    No Fall Risks No Fall Risks  Fall risk Follow up    Falls  evaluation completed Falls evaluation completed;Education provided;Falls prevention discussed   Functional Status Survey:    Vitals:   03/25/23 1551  BP: (!) 150/92  Pulse: 63  Resp: 16  Temp: 98.5 F (36.9 C)  SpO2: 95%  Weight: 213 lb (96.6 kg)  Height: 5\' 5"  (1.651 m)   Body mass index is 35.45 kg/m. Physical Exam Vitals reviewed.  Constitutional:      General: She is not in acute distress.    Appearance: Normal appearance. She is obese. She is not ill-appearing or diaphoretic.  HENT:     Head: Normocephalic.     Right Ear: Tympanic membrane, ear canal and external ear normal. There is no impacted cerumen.     Left Ear: Tympanic membrane, ear canal and external ear normal. There is no impacted cerumen.     Nose: Nose normal. No congestion or rhinorrhea.     Mouth/Throat:     Mouth: Mucous membranes are moist.     Pharynx: Oropharynx is clear. No oropharyngeal exudate or posterior oropharyngeal erythema.  Eyes:     General: No scleral icterus.       Right eye: No discharge.         Left eye: No discharge.     Extraocular Movements: Extraocular movements intact.     Conjunctiva/sclera: Conjunctivae normal.     Pupils: Pupils are equal, round, and reactive to light.  Neck:     Vascular: No carotid bruit.  Cardiovascular:     Rate and Rhythm: Normal rate and regular rhythm.     Pulses: Normal pulses.     Heart sounds: Normal heart sounds. No murmur heard.    No friction rub. No gallop.  Pulmonary:     Effort: Pulmonary effort is normal. No respiratory distress.     Breath sounds: Wheezing present. No rhonchi or rales.  Chest:     Chest wall: No tenderness.  Abdominal:     General: Bowel sounds are normal. There is no distension.     Palpations: Abdomen is soft. There is no mass.     Tenderness: There is no abdominal tenderness. There is no right CVA tenderness, left CVA tenderness, guarding or rebound.  Musculoskeletal:        General: No swelling or tenderness. Normal range of motion.     Cervical back: Normal range of motion. No rigidity or tenderness.     Right lower leg: No edema.     Left lower leg: No edema.  Lymphadenopathy:     Cervical: No cervical adenopathy.  Skin:    General: Skin is warm and dry.     Coloration: Skin is not pale.     Findings: No bruising, erythema, lesion or rash.  Neurological:     Mental Status: She is alert and oriented to person, place, and time.     Motor: No weakness.     Gait: Gait normal.  Psychiatric:        Mood and Affect: Mood normal.        Speech: Speech normal.        Behavior: Behavior normal.    Labs reviewed: Recent Labs    04/01/22 1044 10/08/22 1014 01/10/23 0959  NA 140 141 141  K 3.4* 4.0 4.7  CL 106 106 104  CO2 30 30 29   GLUCOSE 117* 92 88  BUN 18 18 24   CREATININE 1.37* 1.31* 1.23*  CALCIUM 9.7 9.8 10.2   Recent Labs    04/01/22 1044 10/08/22 1014  AST 16 22  ALT 10 16  ALKPHOS 55 59  BILITOT 0.5 0.4  PROT 7.2 7.3  ALBUMIN 3.9 4.0   Recent Labs    04/01/22 1044  10/08/22 1014  WBC 6.8 5.7  NEUTROABS 2.9 2.4  HGB 11.4* 11.6*  HCT 34.7* 35.1*  MCV 88.3 88.6  PLT 273 261   Lab Results  Component Value Date   TSH 0.92 01/10/2023   No results found for: "HGBA1C" Lab Results  Component Value Date   CHOL 243 (H) 01/10/2023   HDL 76 01/10/2023   LDLCALC 149 (H) 01/10/2023   TRIG 79 01/10/2023   CHOLHDL 3.2 01/10/2023    Significant Diagnostic Results in last 30 days:  No results found.  Assessment/Plan  Moderate persistent asthma without complication Bilateral Expiration wheezes noted. - restart Budesonide per patient request tolerated in the past  - start on Montelukast  - refer to Pulmonologist for PFT's. - budesonide-formoterol (SYMBICORT) 160-4.5 MCG/ACT inhaler; Inhale 2 puffs into the lungs 2 (two) times daily.  Dispense: 10.2 g; Refill: 3 - montelukast (SINGULAIR) 10 MG tablet; Take 1 tablet (10 mg total) by mouth at bedtime.  Dispense: 30 tablet; Refill: 3 - Ambulatory referral to Pulmonology  Family/ staff Communication: Reviewed plan of care with patient verbalized understanding   Labs/tests ordered: None   Next Appointment: Return if symptoms worsen or fail to improve.   Caesar Bookman, NP

## 2023-03-26 NOTE — Telephone Encounter (Signed)
Patient is aware 

## 2023-03-27 ENCOUNTER — Telehealth: Payer: Self-pay

## 2023-03-27 NOTE — Telephone Encounter (Signed)
Prior authorization started through covermymeds for Budesonide 160/4.5mg . Waiting onreply

## 2023-03-28 ENCOUNTER — Other Ambulatory Visit: Payer: Self-pay

## 2023-03-28 DIAGNOSIS — E538 Deficiency of other specified B group vitamins: Secondary | ICD-10-CM

## 2023-03-28 DIAGNOSIS — D510 Vitamin B12 deficiency anemia due to intrinsic factor deficiency: Secondary | ICD-10-CM

## 2023-03-28 DIAGNOSIS — D472 Monoclonal gammopathy: Secondary | ICD-10-CM

## 2023-03-31 ENCOUNTER — Inpatient Hospital Stay: Payer: Medicare Other | Attending: Hematology

## 2023-03-31 DIAGNOSIS — D472 Monoclonal gammopathy: Secondary | ICD-10-CM | POA: Insufficient documentation

## 2023-03-31 DIAGNOSIS — E538 Deficiency of other specified B group vitamins: Secondary | ICD-10-CM

## 2023-03-31 DIAGNOSIS — D510 Vitamin B12 deficiency anemia due to intrinsic factor deficiency: Secondary | ICD-10-CM | POA: Insufficient documentation

## 2023-03-31 LAB — CBC WITH DIFFERENTIAL (CANCER CENTER ONLY)
Abs Immature Granulocytes: 0.01 10*3/uL (ref 0.00–0.07)
Basophils Absolute: 0.1 10*3/uL (ref 0.0–0.1)
Basophils Relative: 1 %
Eosinophils Absolute: 0.4 10*3/uL (ref 0.0–0.5)
Eosinophils Relative: 5 %
HCT: 37.6 % (ref 36.0–46.0)
Hemoglobin: 12.3 g/dL (ref 12.0–15.0)
Immature Granulocytes: 0 %
Lymphocytes Relative: 41 %
Lymphs Abs: 3 10*3/uL (ref 0.7–4.0)
MCH: 29.7 pg (ref 26.0–34.0)
MCHC: 32.7 g/dL (ref 30.0–36.0)
MCV: 90.8 fL (ref 80.0–100.0)
Monocytes Absolute: 0.7 10*3/uL (ref 0.1–1.0)
Monocytes Relative: 9 %
Neutro Abs: 3.2 10*3/uL (ref 1.7–7.7)
Neutrophils Relative %: 44 %
Platelet Count: 298 10*3/uL (ref 150–400)
RBC: 4.14 MIL/uL (ref 3.87–5.11)
RDW: 13.9 % (ref 11.5–15.5)
WBC Count: 7.2 10*3/uL (ref 4.0–10.5)
nRBC: 0 % (ref 0.0–0.2)

## 2023-03-31 LAB — CMP (CANCER CENTER ONLY)
ALT: 24 U/L (ref 0–44)
AST: 39 U/L (ref 15–41)
Albumin: 4.1 g/dL (ref 3.5–5.0)
Alkaline Phosphatase: 56 U/L (ref 38–126)
Anion gap: 7 (ref 5–15)
BUN: 18 mg/dL (ref 8–23)
CO2: 28 mmol/L (ref 22–32)
Calcium: 9.8 mg/dL (ref 8.9–10.3)
Chloride: 106 mmol/L (ref 98–111)
Creatinine: 1.61 mg/dL — ABNORMAL HIGH (ref 0.44–1.00)
GFR, Estimated: 31 mL/min — ABNORMAL LOW (ref >60)
Glucose, Bld: 89 mg/dL (ref 70–99)
Potassium: 3.7 mmol/L (ref 3.5–5.1)
Sodium: 141 mmol/L (ref 135–145)
Total Bilirubin: 0.4 mg/dL (ref 0.3–1.2)
Total Protein: 7.5 g/dL (ref 6.5–8.1)

## 2023-03-31 LAB — IRON AND IRON BINDING CAPACITY (CC-WL,HP ONLY)
Iron: 54 ug/dL (ref 28–170)
Saturation Ratios: 19 % (ref 10.4–31.8)
TIBC: 293 ug/dL (ref 250–450)
UIBC: 239 ug/dL (ref 148–442)

## 2023-03-31 LAB — VITAMIN B12: Vitamin B-12: 1414 pg/mL — ABNORMAL HIGH (ref 180–914)

## 2023-03-31 LAB — FERRITIN: Ferritin: 115 ng/mL (ref 11–307)

## 2023-04-08 LAB — MULTIPLE MYELOMA PANEL, SERUM
Albumin SerPl Elph-Mcnc: 3.7 g/dL (ref 2.9–4.4)
Albumin/Glob SerPl: 1.2 (ref 0.7–1.7)
Alpha 1: 0.3 g/dL (ref 0.0–0.4)
Alpha2 Glob SerPl Elph-Mcnc: 0.7 g/dL (ref 0.4–1.0)
B-Globulin SerPl Elph-Mcnc: 0.9 g/dL (ref 0.7–1.3)
Gamma Glob SerPl Elph-Mcnc: 1.3 g/dL (ref 0.4–1.8)
Globulin, Total: 3.3 g/dL (ref 2.2–3.9)
IgA: 139 mg/dL (ref 64–422)
IgG (Immunoglobin G), Serum: 1550 mg/dL (ref 586–1602)
IgM (Immunoglobulin M), Srm: 41 mg/dL (ref 26–217)
M Protein SerPl Elph-Mcnc: 1.1 g/dL — ABNORMAL HIGH
Total Protein ELP: 7 g/dL (ref 6.0–8.5)

## 2023-04-11 ENCOUNTER — Ambulatory Visit: Payer: Medicare Other | Admitting: Nurse Practitioner

## 2023-04-14 ENCOUNTER — Inpatient Hospital Stay (HOSPITAL_BASED_OUTPATIENT_CLINIC_OR_DEPARTMENT_OTHER): Payer: Medicare Other | Admitting: Hematology

## 2023-04-14 DIAGNOSIS — D510 Vitamin B12 deficiency anemia due to intrinsic factor deficiency: Secondary | ICD-10-CM | POA: Diagnosis not present

## 2023-04-14 DIAGNOSIS — D472 Monoclonal gammopathy: Secondary | ICD-10-CM | POA: Diagnosis not present

## 2023-04-14 MED ORDER — FERROUS SULFATE 325 (65 FE) MG PO TBEC: 325.0000 mg | DELAYED_RELEASE_TABLET | Freq: Every day | ORAL | Status: AC

## 2023-04-14 NOTE — Progress Notes (Signed)
HEMATOLOGY/ONCOLOGY PHONE VISIT NOTE  Date of Service: 04/14/23    Patient Care Team: Sharon Seller, NP as PCP - General (Geriatric Medicine) Helane Gunther, DPM as Consulting Physician (Podiatry) Yates Decamp, MD as Consulting Physician (Cardiology) Ernesto Rutherford, MD as Referring Physician (Ophthalmology) Estanislado Emms, MD as Consulting Physician (Nephrology)  CHIEF COMPLAINTS/PURPOSE OF CONSULTATION:  Continued follow-up for MGUS and management of pernicious anemia  HISTORY OF PRESENTING ILLNESS:  Brenda Barry is a wonderful 87 y.o. female who has been referred to [place] by Dr. Antony Haste for evaluation and management of Monoclonal Paraproteinemia. The pt reports that she is doing well overall.   The pt reports that she has had sciatica in the last year with referred pain down her leg, caused by a pinched nerve at L4/5.  The pt is intending to see Dr. Amada Jupiter in neurosurgery soon. The pt's lower back pain has been evaluated with an MRI most recently on 04/24/18. The pt is also being evaluated by cardiology.  The pt denies any neck pain whatsoever and denies any new bone pains.   The pt notes that her kidney functions have been stable recently and has been pursuing care with Dr. Casimiro Needle at [facility]. The pt notes that her blood pressure has been high, and is elevated more when she sees physicians. She has taken HCTZ, Toprol-XL, and Hydralazine. The pt notes that her thyroid medication has recently been adjusted.   Of note prior to the patient's presentation, the pt had a Bone Survey on 01/03/17 which revealed No definite lytic lesions within the axial skeleton. 2. Mixed lytic and blastic lesion posteriorly at C7. CT or MRI could be used for further evaluation of the cervical spine. 3. 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 No discrete lytic lesions are present.   Subsequently the pt then had a  CT Cervical Spine on 02/21/17 which revealed CT scan does not show a convincing abnormality within the C7 vertebral body. However, within the posterior C3 vertebral body, there is a well-circumscribed lucent area measuring 8 x 4 x 5 mm which, though not specific or conclusive, could represent a focus of myeloma.  Most recent lab results (04/21/18) of CMP is as follows: all values are WNL except for Glucose at 104, Creatinine at 1.07, GFR at 57, BUN at 10, Sodium at 145, Phosphorous at 2.2. 04/21/18 SPEP revealed all values WNL except for M-spike at 0.7, with IgG monoclonal protein with kappa light chain specificity.   On review of systems, pt reports sciatica, stable energy levels, stable lower back pain, and denies any neck pain, new bone pains, abdominal pains, chest wall pain, and any other symptoms.   On PMHx the pt reports Osteoporosis, Hypothyroidism, Hyperlipidemia, CAD with 5p stent place in late 90s, benign lumpectomy of right breast, HTN, IgG monoclonal gammopathy, CKD stage III.   Interval History:   Brenda Barry is here for her 11-month follow-up for continued evaluation and management of pernicious anemia and surveillance of MGUS.   I connected with [name] on 04/14/23 at  3:30 PM EDT by telephone visit and verified that I am speaking with the correct person using two identifiers.    I had a phone visit with the patient on 10/11/2022 and she was doing well overall.   Patient notes she has been doing well overall without any new or severe medical concerns since our last visit. Her new PCP is  NP [facility].   She has discontinued Zetia and Zaditor. She is currently taking 1 iron supplement pill a day.   She denies any new infection issues, fever, chills, night sweats, unexpected weight loss, abdominal pain, chest pain, back pain, abnormal bowel movement, or leg swelling.   Discussed lab results from 03/31/2023 in detail with the patient.    I discussed the limitations,  risks, security and privacy concerns of performing an evaluation and management service by telemedicine and the availability of in-person appointments. I also discussed with the patient that there may be a patient responsible charge related to this service. The patient expressed understanding and agreed to proceed.   Other persons participating in the visit and their role in the encounter: none   Patient's location: home  Provider's location: [facility]   Chief Complaint: Continued follow-up for MGUS and management of pernicious anemia     MEDICAL HISTORY:  HTN HLD Anxiety Sciatica  SURGICAL HISTORY: Past Surgical History:  Procedure Laterality Date   BREAST LUMPECTOMY     1980's Per [facility] New Patient Packet   CATARACT EXTRACTION     2018, Per [facility] New Patient Packet   COLONOSCOPY     Per [facility] New Patient Packet   HIP SURGERY Right 10/05/2018   TONSILLECTOMY     1950's Per [facility] New Patient Packet    SOCIAL HISTORY: Social History   Socioeconomic History   Marital status: Widowed    Spouse name: Not on file   Number of children: 3   Years of education: Not on file   Highest education level: Not on file  Occupational History   Not on file  Tobacco Use   Smoking status: Former    Current packs/day: 0.00    Average packs/day: 0.3 packs/day for 7.0 years (1.8 ttl pk-yrs)    Types: Cigarettes    Start date: 04/18/1964    Quit date: 04/19/1971    Years since quitting: 52.0   Smokeless tobacco: Never  Vaping Use   Vaping status: Never Used  Substance and Sexual Activity   Alcohol use: No   Drug use: No   Sexual activity: Not on file  Other Topics Concern   Not on file  Social History Narrative   Diet: all types of foods       Caffeine: Cola and coffee      Married, if yes what year: Widowed, 1962 year married       Do you live in a house, apartment, assisted living, condo, trailer, ect: House       Is it one or more stories: One stories       How many persons live in  your home? 1 daughter, during school 2 grandsons       Pets: None      Highest level or education completed: 12th grade       Current/Past profession: Visual merchandiser       Exercise: NO        Type and how often:          Living Will: let blank    DNR: No   POA/HPOA: Yes      Functional Status:   Do you have difficulty bathing or dressing yourself? No   Do you have difficulty preparing food or eating? No   Do you have difficulty managing your medications? No   Do you have difficulty managing your finances? No   Do you have difficulty affording your medications? No  Social Determinants of Health   Financial Resource Strain: Low Risk  (08/07/2022)   Received from [facility], [facility]   Overall Financial Resource Strain (CARDIA)    Difficulty of Paying Living Expenses: Not hard at all  Food Insecurity: No Food Insecurity (08/07/2022)   Received from [facility], [facility]   Hunger Vital Sign    Worried About Running Out of Food in the Last Year: Never true    Ran Out of Food in the Last Year: Never true  Transportation Needs: No Transportation Needs (08/07/2022)   Received from [facility], [facility]   PRAPARE - Transportation    Lack of Transportation (Medical): No    Lack of Transportation (Non-Medical): No  Physical Activity: Insufficiently Active (08/07/2022)   Received from [facility], [facility]   Exercise Vital Sign    Days of Exercise per Week: 7 days    Minutes of Exercise per Session: 20 min  Stress: No Stress Concern Present (08/07/2022)   Received from [facility], [facility] - Occupational Stress Questionnaire    Feeling of Stress : Only a little  Social Connections: Moderately Integrated (08/07/2022)   Received from [facility], [facility]   Social Network    How would you rate your social network (family, work, friends)?: Adequate participation with social networks  Intimate  Partner Violence: Not At Risk (08/07/2022)   Received from [facility], [facility]   HITS    Over the last 12 months how often did your partner physically hurt you?: 1    Over the last 12 months how often did your partner insult you or talk down to you?: 1    Over the last 12 months how often did your partner threaten you with physical harm?: 1    Over the last 12 months how often did your partner scream or curse at you?: 1    FAMILY HISTORY: Family History  Problem Relation Age of Onset   Tuberculosis Mother    Heart disease Father    Schizophrenia Daughter     ALLERGIES:  is allergic to amlodipine besylate, atorvastatin, isosorbide dinitrate, breo ellipta [fluticasone furoate-vilanterol], and rosuvastatin.  MEDICATIONS:  Current Outpatient Medications  Medication Sig Dispense Refill   albuterol (PROVENTIL HFA;VENTOLIN HFA) 108 (90 Base) MCG/ACT inhaler Inhale 2 puffs into the lungs as needed.     aspirin (ASPIRIN CHILDRENS) 81 MG chewable tablet Chew 1 tablet (81 mg total) by mouth daily.     budesonide-formoterol (SYMBICORT) 160-4.5 MCG/ACT inhaler Inhale 2 puffs into the lungs 2 (two) times daily. 10.2 g 3   Cholecalciferol 50 MCG (2000 UT) CAPS Take 1 capsule by mouth daily.     cyanocobalamin (VITAMIN B12) 1000 MCG tablet Take 1,000 mcg by mouth daily.     ezetimibe (ZETIA) 10 MG tablet Take 10 mg by mouth daily.     ferrous sulfate 325 (65 FE) MG EC tablet Take 1 tablet (325 mg total) by mouth 2 (two) times daily. 30 tablet 2   hydrALAZINE (APRESOLINE) 50 MG tablet Take 50 mg by mouth 2 (two) times daily.     inclisiran (LEQVIO) 284 MG/1.5ML SOSY injection Inject 284 mg into the skin every 6 (six) months.     ketotifen (ZADITOR) 0.035 % ophthalmic solution 1 drop as needed.     latanoprost (XALATAN) 0.005 % ophthalmic solution Place 1 drop into both eyes at bedtime.     levothyroxine (SYNTHROID) 88 MCG tablet  Take 1 tablet (88 mcg total) by mouth daily before breakfast. 90  tablet 1   montelukast (SINGULAIR) 10 MG tablet Take 1 tablet (10 mg total) by mouth at bedtime. 30 tablet 3   olmesartan-hydrochlorothiazide (BENICAR HCT) 40-12.5 MG tablet Take 1 tablet by mouth daily. 90 tablet 1   No current facility-administered medications for this visit.    REVIEW OF SYSTEMS:    10 Point review of Systems was done is negative except as noted above.   PHYSICAL EXAMINATION: Telemedicine visit   LABORATORY DATA:  I have reviewed the data as listed  .    Latest Ref Rng & Units 03/31/2023   10:15 AM 10/08/2022   10:14 AM 04/01/2022   10:44 AM  CBC  WBC 4.0 - 10.5 K/uL 7.2  5.7  6.8   Hemoglobin 12.0 - 15.0 g/dL 10.2  72.5  36.6   Hematocrit 36.0 - 46.0 % 37.6  35.1  34.7   Platelets 150 - 400 K/uL 298  261  273        Latest Ref Rng & Units 03/31/2023   10:15 AM 01/10/2023    9:59 AM 10/08/2022   10:14 AM  CMP  Glucose 70 - 99 mg/dL 89  88  92   BUN 8 - 23 mg/dL 18  24  18    Creatinine 0.44 - 1.00 mg/dL 4.40  3.47  4.25   Sodium 135 - 145 mmol/L 141  141  141   Potassium 3.5 - 5.1 mmol/L 3.7  4.7  4.0   Chloride 98 - 111 mmol/L 106  104  106   CO2 22 - 32 mmol/L 28  29  30    Calcium 8.9 - 10.3 mg/dL 9.8  95.6  9.8   Total Protein 6.5 - 8.1 g/dL 7.5   7.3   Total Bilirubin 0.3 - 1.2 mg/dL 0.4   0.4   Alkaline Phos 38 - 126 U/L 56   59   AST 15 - 41 U/L 39   22   ALT 0 - 44 U/L 24   16    . Lab Results  Component Value Date   IRON 54 03/31/2023   TIBC 293 03/31/2023   IRONPCTSAT 19 03/31/2023   (Iron and TIBC)  Lab Results  Component Value Date   FERRITIN 115 03/31/2023   B12 -743-->1414    RADIOGRAPHIC STUDIES: I have personally reviewed the radiological images as listed and agreed with the findings in the report. No results found.  ASSESSMENT & PLAN:   87 y.o. female with  1. MGUS- Monoclonal Gammopathy or undetermined significance. 01/03/17 Bone Survey revealed No definite lytic lesions within the axial skeleton. 2. Mixed lytic  and blastic lesion posteriorly at C7. CT or MRI could be used for further evaluation of the cervical spine. 3. 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 No discrete lytic lesions are present.  02/21/17 CT Cervical Spine revealed CT scan does not show a convincing abnormality within the C7 vertebral body. However, within the posterior C3 vertebral body, there is a well-circumscribed lucent area measuring 8 x 4 x 5 mm which, though not specific or conclusive, could represent a focus of myeloma.  04/24/18 MRI Lumbar Spine revealed degenerative disc disease, multilevel disc bulges and disc osteophytes and advised that the pt speak with her PCP regarding 2.9cm cystic lesion in right adnexa  Labs from initial presentation from 04/21/18: M Protein at 0.7g  2. Pernicious anemia  B12 levels today stable at 743.  3. HTN -chronically uncontrolled . Element of white coat hypertension. Patient notes BP much better at home.  PLAN: -Discussed lab results from 03/31/2023 in detail with the patient. CBC is stable. CMP shows elevated creatinine at 1.61. Ferritin at 115. Iron saturation ratio at 19%. Elevated Vitamin B-12 level at 1,414 pg/,mL. -Discussed multiple myeloma panel results from 03/31/2023 with the patient. Showed elevated M-protein at 1.1. Last M-protein was at 1.0 around 6 months ago.  -Recommend to stay well hydrated as pt's creatinine is elevated at 1.61. Recommend to follow-up with PCP.  -No lab or clinical evidence of MGUS progression. -continue f/u with PCP to optimize BP control. -continue Iron polysaccharide -RTC with labs with Dr. Candise Che in 6 months. If everything remains stable, we will alternate visit with PCP.  -Phone visit with Dr. Candise Che in 6 months -Labs 2 weeks prior to phone visit  FOLLOW-UP: Phone visit with Dr. Candise Che in 6 months Labs 2 weeks prior to phone visit  The total time spent in the appointment was 20 minutes*  .  All of the patient's questions were answered with apparent satisfaction. The patient knows to call the clinic with any problems, questions or concerns.   Wyvonnia Lora MD MS AAHIVMS [facility] Hematology/Oncology Physician [facility]  .*Total Encounter Time as defined by the Centers for Medicare and Medicaid Services includes, in addition to the face-to-face time of a patient visit (documented in the note above) non-face-to-face time: obtaining and reviewing outside history, ordering and reviewing medications, tests or procedures, care coordination (communications with other health care professionals or caregivers) and documentation in the medical record.   I,Param Shah,acting as a Neurosurgeon for Wyvonnia Lora, MD.,have documented all relevant documentation on the behalf of Wyvonnia Lora, MD,as directed by  Wyvonnia Lora, MD while in the presence of Wyvonnia Lora, MD.   .I have reviewed the above documentation for accuracy and completeness, and I agree with the above. Johney Maine MD

## 2023-04-20 ENCOUNTER — Encounter: Payer: Self-pay | Admitting: Cardiology

## 2023-05-06 ENCOUNTER — Ambulatory Visit (INDEPENDENT_AMBULATORY_CARE_PROVIDER_SITE_OTHER): Payer: Medicare Other | Admitting: *Deleted

## 2023-05-06 VITALS — BP 179/84 | HR 83 | Temp 97.7°F | Resp 16 | Ht 65.5 in | Wt 209.6 lb

## 2023-05-06 DIAGNOSIS — E782 Mixed hyperlipidemia: Secondary | ICD-10-CM

## 2023-05-06 MED ORDER — INCLISIRAN SODIUM 284 MG/1.5ML ~~LOC~~ SOSY
284.0000 mg | PREFILLED_SYRINGE | Freq: Once | SUBCUTANEOUS | Status: AC
  Administered 2023-05-06: 284 mg via SUBCUTANEOUS
  Filled 2023-05-06: qty 1.5

## 2023-05-06 NOTE — Progress Notes (Signed)
Diagnosis: Hyperlipidemia  Provider:  Chilton Greathouse MD  Procedure: Injection  Leqvio (inclisiran), Dose: 284 mg, Site: subcutaneous, Number of injections: 1  Post Care: Observation period completed  Discharge: Condition: Good, Destination: Home . AVS Provided  Performed by:  Forrest Moron, RN

## 2023-05-12 ENCOUNTER — Encounter: Payer: Self-pay | Admitting: Nurse Practitioner

## 2023-05-12 ENCOUNTER — Ambulatory Visit: Payer: Medicare Other | Admitting: Nurse Practitioner

## 2023-05-12 ENCOUNTER — Ambulatory Visit (INDEPENDENT_AMBULATORY_CARE_PROVIDER_SITE_OTHER): Payer: Medicare Other | Admitting: Nurse Practitioner

## 2023-05-12 VITALS — BP 148/76 | HR 68 | Temp 97.3°F | Ht 65.5 in | Wt 210.4 lb

## 2023-05-12 DIAGNOSIS — M8580 Other specified disorders of bone density and structure, unspecified site: Secondary | ICD-10-CM

## 2023-05-12 DIAGNOSIS — N183 Chronic kidney disease, stage 3 unspecified: Secondary | ICD-10-CM

## 2023-05-12 DIAGNOSIS — H9313 Tinnitus, bilateral: Secondary | ICD-10-CM | POA: Diagnosis not present

## 2023-05-12 DIAGNOSIS — E039 Hypothyroidism, unspecified: Secondary | ICD-10-CM | POA: Diagnosis not present

## 2023-05-12 DIAGNOSIS — I10 Essential (primary) hypertension: Secondary | ICD-10-CM

## 2023-05-12 DIAGNOSIS — J4540 Moderate persistent asthma, uncomplicated: Secondary | ICD-10-CM | POA: Diagnosis not present

## 2023-05-12 LAB — BASIC METABOLIC PANEL WITH GFR
BUN/Creatinine Ratio: 12 (calc) (ref 6–22)
BUN: 16 mg/dL (ref 7–25)
CO2: 29 mmol/L (ref 20–32)
Calcium: 10.2 mg/dL (ref 8.6–10.4)
Chloride: 106 mmol/L (ref 98–110)
Creat: 1.29 mg/dL — ABNORMAL HIGH (ref 0.60–0.95)
Glucose, Bld: 95 mg/dL (ref 65–99)
Potassium: 4.7 mmol/L (ref 3.5–5.3)
Sodium: 141 mmol/L (ref 135–146)
eGFR: 41 mL/min/{1.73_m2} — ABNORMAL LOW (ref >=60)

## 2023-05-12 NOTE — Progress Notes (Signed)
Careteam: Patient Care Team: Sharon Seller, NP as PCP - General (Geriatric Medicine) Helane Gunther, DPM as Consulting Physician (Podiatry) Yates Decamp, MD as Consulting Physician (Cardiology) Ernesto Rutherford, MD as Referring Physician (Ophthalmology) Estanislado Emms, MD as Consulting Physician (Nephrology)  PLACE OF SERVICE:  [facility]  Advanced Directive information    Allergies  Allergen Reactions   Amlodipine Besylate Other (See Comments)    Nightmares and nerve pain Nightmares and nerve pain   Atorvastatin Other (See Comments)    nightmare nightmare   Isosorbide Dinitrate Other (See Comments)    Nerve pain, lower back pain, feels out of it Nerve pain, lower back pain, feels out of it   Breo Ellipta [Fluticasone Furoate-Vilanterol] Other (See Comments)    Tongue swelling   Rosuvastatin Other (See Comments)    Nightmare.  Hence takes it during day    Chief Complaint  Patient presents with   Medical Management of Chronic Issues    Patient presents today for a 4 month follow-up   Quality Metric Gaps    TDAP, zoster, COVID#3     HPI: Patient is a 87 y.o. female for routine follow up  Bp is always high in office- reports she checks weekly at home generally sbp 120-130/80s  Urinates a lot. (On hydrochlorothiazide for bp)  She was having issues with her asthma medication so she saw Dinah and was restarted on symbicort and started singulair but read side effects on singulair and reported side effects could cause severe mental health problems and therefore she was not going to take.  She reports she felt like her breathing is controlled at this time.  Did not follow up on pulmonary referral because does not want to go to any additional appt.   She has ringing in her ears always and has to live with this.   She is followed by cardiology and got injection for cholesterol.  Had some hip pain yesterday but this subsided.   Does not get flu shot- declines  today   Review of Systems:  Review of Systems  Constitutional:  Negative for chills, fever and weight loss.  HENT:  Positive for tinnitus.   Respiratory:  Positive for shortness of breath and wheezing. Negative for cough and sputum production.        Wheezing and shortness of breath controlled on symbicort.   Cardiovascular:  Negative for chest pain, palpitations and leg swelling.  Gastrointestinal:  Negative for abdominal pain, constipation, diarrhea and heartburn.  Genitourinary:  Negative for dysuria, frequency and urgency.  Musculoskeletal:  Negative for back pain, falls, joint pain and myalgias.  Skin: Negative.   Neurological:  Negative for dizziness and headaches.  Psychiatric/Behavioral:  Negative for depression and memory loss. The patient does not have insomnia.     Past Medical History:  Diagnosis Date   Anemia    Per [facility] New Patient Packet   Asthma    Per [facility] New Patient Packet   Bilateral carotid bruits 02/12/2019   Coronary artery disease    Glaucoma    Per [facility] New Patient Packet   H/O bone density study    Per [facility] New Patient Packet   H/O heart artery stent    Per [facility] New Patient Packet   H/O mammogram    Per [facility] New Patient Packet   Heart murmur    Per [facility] New Patient Packet   Hyperlipidemia    Hypertension    Hypothyroid    Per [facility]  New Patient Packet   Nerve pain    Per [facility] New Patient Packet   Peripheral artery disease (HCC) 02/12/2019   Prediabetes    Per [facility] New Patient Packet   Shingles    Per [facility] New Patient Packet   Stage 3 chronic kidney disease (HCC)    Per [facility] New Patient Packet   Past Surgical History:  Procedure Laterality Date   BREAST LUMPECTOMY     1980's Per [facility] New Patient Packet   CATARACT EXTRACTION     2018, Per [facility] New Patient Packet   COLONOSCOPY     Per [facility] New Patient Packet   HIP SURGERY Right 10/05/2018   TONSILLECTOMY     1950's Per [facility] New Patient Packet   Social History:   reports that she quit smoking about  52 years ago. Her smoking use included cigarettes. She started smoking about 59 years ago. She has a 1.8 pack-year smoking history. She has never used smokeless tobacco. She reports that she does not drink alcohol and does not use drugs.  Family History  Problem Relation Age of Onset   Tuberculosis Mother    Heart disease Father    Schizophrenia Daughter     Medications: Patient's Medications  New Prescriptions   No medications on file  Previous Medications   ALBUTEROL (PROVENTIL HFA;VENTOLIN HFA) 108 (90 BASE) MCG/ACT INHALER    Inhale 2 puffs into the lungs as needed.   ASPIRIN (ASPIRIN CHILDRENS) 81 MG CHEWABLE TABLET    Chew 1 tablet (81 mg total) by mouth daily.   BUDESONIDE-FORMOTEROL (SYMBICORT) 160-4.5 MCG/ACT INHALER    Inhale 2 puffs into the lungs 2 (two) times daily.   CHOLECALCIFEROL 50 MCG (2000 UT) CAPS    Take 1 capsule by mouth daily.   CYANOCOBALAMIN (VITAMIN B12) 1000 MCG TABLET    Take 1,000 mcg by mouth daily.   FERROUS SULFATE 325 (65 FE) MG EC TABLET    Take 1 tablet (325 mg total) by mouth daily with breakfast.   HYDRALAZINE (APRESOLINE) 50 MG TABLET    Take 50 mg by mouth 2 (two) times daily.   INCLISIRAN (LEQVIO) 284 MG/1.5ML SOSY INJECTION    Inject 284 mg into the skin every 6 (six) months.   LATANOPROST (XALATAN) 0.005 % OPHTHALMIC SOLUTION    Place 1 drop into both eyes at bedtime.   LEVOTHYROXINE (SYNTHROID) 88 MCG TABLET    Take 1 tablet (88 mcg total) by mouth daily before breakfast.   OLMESARTAN-HYDROCHLOROTHIAZIDE (BENICAR HCT) 40-12.5 MG TABLET    Take 1 tablet by mouth daily.  Modified Medications   No medications on file  Discontinued Medications   No medications on file    Physical Exam:  Vitals:   05/12/23 1007 05/12/23 1014  BP: (!) 150/82 (!) 148/76  Pulse: 68   Temp: (!) 97.3 F (36.3 C)   SpO2: 97%   Weight: 210 lb 6.4 oz (95.4 kg)   Height: 5' 5.5" (1.664 m)    Body mass index is 34.48 kg/m. Wt Readings from Last 3 Encounters:   05/12/23 210 lb 6.4 oz (95.4 kg)  05/06/23 209 lb 9.6 oz (95.1 kg)  03/25/23 213 lb (96.6 kg)    Physical Exam Constitutional:      General: She is not in acute distress.    Appearance: She is well-developed. She is not diaphoretic.  HENT:     Head: Normocephalic and atraumatic.     Mouth/Throat:     Pharynx: No oropharyngeal  exudate.  Eyes:     Conjunctiva/sclera: Conjunctivae normal.     Pupils: Pupils are equal, round, and reactive to light.  Cardiovascular:     Rate and Rhythm: Normal rate and regular rhythm.     Heart sounds: Normal heart sounds.  Pulmonary:     Effort: Pulmonary effort is normal.     Breath sounds: Normal breath sounds.  Abdominal:     General: Bowel sounds are normal.     Palpations: Abdomen is soft.  Musculoskeletal:     Cervical back: Normal range of motion and neck supple.     Right lower leg: No edema.     Left lower leg: No edema.  Skin:    General: Skin is warm and dry.  Neurological:     Mental Status: She is alert.  Psychiatric:        Mood and Affect: Mood normal.     Labs reviewed: Basic Metabolic Panel: Recent Labs    10/08/22 1014 01/10/23 0959 03/31/23 1015  NA 141 141 141  K 4.0 4.7 3.7  CL 106 104 106  CO2 30 29 28   GLUCOSE 92 88 89  BUN 18 24 18   CREATININE 1.31* 1.23* 1.61*  CALCIUM 9.8 10.2 9.8  TSH  --  0.92  --    Liver Function Tests: Recent Labs    10/08/22 1014 03/31/23 1015  AST 22 39  ALT 16 24  ALKPHOS 59 56  BILITOT 0.4 0.4  PROT 7.3 7.5  ALBUMIN 4.0 4.1   No results for input(s): "LIPASE", "AMYLASE" in the last 8760 hours. No results for input(s): "AMMONIA" in the last 8760 hours. CBC: Recent Labs    10/08/22 1014 03/31/23 1015  WBC 5.7 7.2  NEUTROABS 2.4 3.2  HGB 11.6* 12.3  HCT 35.1* 37.6  MCV 88.6 90.8  PLT 261 298   Lipid Panel: Recent Labs    01/10/23 0959  CHOL 243*  HDL 76  LDLCALC 149*  TRIG 79  CHOLHDL 3.2   TSH: Recent Labs    01/10/23 0959  TSH 0.92    A1C: No results found for: "HGBA1C"   Assessment/Plan 1. Osteopenia, unspecified location -reviewed bone density. Recommended to take calcium 600 mg twice daily with Vitamin D 2000 units daily and weight bearing activity 30 mins/5 days a week  2. Tinnitus of both ears Ongoing, she has seen ENT in the past.   3. Acquired hypothyroidism -TSH at goal on last labs.  Continue current synthroid.   4. Moderate persistent asthma without complication Doing well on symbicort. Did not wish to take Singulair due to potential mental health side effects. She is doing well without medication. Also does not feel like she needs to follow up with pulmonary at this time. Will continue current regimen and monitor.   5. Stage 3 chronic kidney disease, unspecified whether stage 3a or 3b CKD (HCC) Worse on recent labs, will follow up today, may need to change bp medication and dc hydrochlorothiazide.  -Encouraged proper hydration and to avoid NSAIDS (Aleve, Advil, Motrin, Ibuprofen)  - Basic Metabolic Panel with eGFR  6. Essential hypertension -elevated in office but pt reports white coat hypertension. Checks at home and well controlled. Will continue current regimen.  - Basic Metabolic Panel with eGFR   Return in about 4 months (around 09/12/2023) for routine follow up.  Janene Harvey. Biagio [facility] [phone]

## 2023-05-12 NOTE — Patient Instructions (Addendum)
For osteopenia Recommended to take calcium 600 mg twice daily with Vitamin D 2000 units daily and weight bearing activity 30 mins/5 days a week  Encourage proper hydration- drink plenty of water and to avoid NSAIDS (Aleve, Advil, Motrin, Ibuprofen)

## 2023-06-16 ENCOUNTER — Encounter: Payer: Self-pay | Admitting: Podiatry

## 2023-06-16 ENCOUNTER — Ambulatory Visit (INDEPENDENT_AMBULATORY_CARE_PROVIDER_SITE_OTHER): Payer: Medicare Other | Admitting: Podiatry

## 2023-06-16 DIAGNOSIS — M79674 Pain in right toe(s): Secondary | ICD-10-CM | POA: Diagnosis not present

## 2023-06-16 DIAGNOSIS — M79675 Pain in left toe(s): Secondary | ICD-10-CM | POA: Diagnosis not present

## 2023-06-16 DIAGNOSIS — B351 Tinea unguium: Secondary | ICD-10-CM

## 2023-06-16 DIAGNOSIS — D689 Coagulation defect, unspecified: Secondary | ICD-10-CM

## 2023-06-16 NOTE — Progress Notes (Signed)
This patient returns to my office for at risk foot care.  This patient requires this care by a professional since this patient will be at risk due to having chronic kidney disease and PAD.Brenda Barry  This patient is unable to cut nails herself since the patient cannot reach her nails.These nails are painful walking and wearing shoes.  This patient presents for at risk foot care today.    General Appearance  Alert, conversant and in no acute stress.  Vascular  Dorsalis pedis and posterior tibial  pulses are palpable  bilaterally.  Capillary return is within normal limits  bilaterally. Temperature is within normal limits  bilaterally.  Neurologic  Senn-Weinstein monofilament wire test within normal limits  bilaterally. Muscle power within normal limits bilaterally.  Nails Thick disfigured discolored nails with subungual debris  from hallux to fifth toes bilaterally. No evidence of bacterial infection or drainage bilaterally.  Orthopedic  No limitations of motion  feet .  No crepitus or effusions noted.  No bony pathology or digital deformities noted.  HAV  B/L.  Skin  normotropic skin with no porokeratosis noted bilaterally.  No signs of infections or ulcers noted.   Asymptomatic HD 5th B/L.  Onychomycosis  Pain in right toes  Pain in left toes  Consent was obtained for treatment procedures.   Mechanical debridement of nails 1-5  bilaterally performed with a nail nipper.  Filed with dremel without incident.    Return office visit   3 months                   Told patient to return for periodic foot care and evaluation due to potential at risk complications.   Helane Gunther DPM

## 2023-07-02 ENCOUNTER — Ambulatory Visit (HOSPITAL_COMMUNITY)
Admission: RE | Admit: 2023-07-02 | Discharge: 2023-07-02 | Disposition: A | Discharge status: Home / Self Care | Payer: Medicare Other | Source: Ambulatory Visit | Attending: Cardiology | Admitting: Cardiology

## 2023-07-02 ENCOUNTER — Other Ambulatory Visit: Payer: Federal, State, Local not specified - PPO

## 2023-07-02 DIAGNOSIS — I6523 Occlusion and stenosis of bilateral carotid arteries: Secondary | ICD-10-CM | POA: Diagnosis present

## 2023-07-09 ENCOUNTER — Telehealth: Payer: Medicare Other

## 2023-07-09 MED ORDER — HYDRALAZINE HCL 50 MG PO TABS: 50.0000 mg | ORAL_TABLET | Freq: Two times a day (BID) | ORAL | 3 refills | Status: DC

## 2023-07-09 NOTE — Telephone Encounter (Signed)
Patient called to say she left a message last week requesting a refill on hydralazine and had not heard anything.  RX sent to pharmacy at this time

## 2023-07-21 ENCOUNTER — Other Ambulatory Visit: Payer: Self-pay | Admitting: Nurse Practitioner

## 2023-09-12 ENCOUNTER — Encounter: Payer: Self-pay | Admitting: Cardiology

## 2023-09-12 ENCOUNTER — Ambulatory Visit (INDEPENDENT_AMBULATORY_CARE_PROVIDER_SITE_OTHER): Payer: Medicare Other | Admitting: Nurse Practitioner

## 2023-09-12 ENCOUNTER — Encounter: Payer: Self-pay | Admitting: Nurse Practitioner

## 2023-09-12 VITALS — BP 127/88 | HR 83 | Temp 96.5°F | Resp 18 | Ht 65.5 in | Wt 212.6 lb

## 2023-09-12 DIAGNOSIS — R0683 Snoring: Secondary | ICD-10-CM

## 2023-09-12 DIAGNOSIS — E782 Mixed hyperlipidemia: Secondary | ICD-10-CM

## 2023-09-12 DIAGNOSIS — D649 Anemia, unspecified: Secondary | ICD-10-CM

## 2023-09-12 DIAGNOSIS — H3530 Unspecified macular degeneration: Secondary | ICD-10-CM | POA: Diagnosis not present

## 2023-09-12 DIAGNOSIS — N183 Chronic kidney disease, stage 3 unspecified: Secondary | ICD-10-CM

## 2023-09-12 DIAGNOSIS — E039 Hypothyroidism, unspecified: Secondary | ICD-10-CM | POA: Diagnosis not present

## 2023-09-12 DIAGNOSIS — J4540 Moderate persistent asthma, uncomplicated: Secondary | ICD-10-CM

## 2023-09-12 DIAGNOSIS — I10 Essential (primary) hypertension: Secondary | ICD-10-CM

## 2023-09-12 MED ORDER — HYDRALAZINE HCL 25 MG PO TABS: ORAL_TABLET | ORAL | 1 refills | Status: DC

## 2023-09-12 NOTE — Patient Instructions (Signed)
 Decrease hydralazine to 25 mg at 9 am, 25 mg at 2pm and 50 mg 8 pm  -Blood pressure elevated today but typically well controlled To check blood pressure 1 hour AFTER you have had your medication Make sure you have been sitting at least 5 mins  Record and let us know.  Goal <140/90

## 2023-09-12 NOTE — Progress Notes (Signed)
 Careteam: Patient Care Team: Sharon Seller, NP as PCP - General (Geriatric Medicine) Helane Gunther, DPM as Consulting Physician (Podiatry) Yates Decamp, MD as Consulting Physician (Cardiology) Ernesto Rutherford, MD as Referring Physician (Ophthalmology) Estanislado Emms, MD as Consulting Physician (Nephrology)  PLACE OF SERVICE:  [facility]  Advanced Directive information Does Patient Have a Medical Advance Directive?: No, Would patient like information on creating a medical advance directive?: No - Patient declined  Allergies  Allergen Reactions   Amlodipine Besylate Other (See Comments)    Nightmares and nerve pain Nightmares and nerve pain   Atorvastatin Other (See Comments)    nightmare nightmare   Isosorbide Dinitrate Other (See Comments)    Nerve pain, lower back pain, feels out of it Nerve pain, lower back pain, feels out of it   Breo Ellipta [Fluticasone Furoate-Vilanterol] Other (See Comments)    Tongue swelling   Rosuvastatin Other (See Comments)    Nightmare.  Hence takes it during day    Chief Complaint  Patient presents with   Medical Management of Chronic Issues    4 months follow-up   Immunizations    Covid and Shingrix   Health Maintenance    Medicare Annual Wellness visit   Discussed the use of AI scribe software for clinical note transcription with the patient, who gave verbal consent to proceed.  History of Present Illness   Brenda Barry is an 88 year old female with asthma and hypertension who presents for a routine follow-up.  She manages her asthma with Symbicort, two puffs twice a day, and albuterol as needed, typically once a week. She experiences occasional wheezing and voice changes but no significant shortness of breath unless after prolonged walking. No recent exacerbations, cough, or congestion.  Her blood pressure is managed with olmesartan hydrochlorothiazide and hydralazine. She feels sedated and tired due to hydralazine, impacting  her ability to stay alert, especially as she cares for her family. She has adjusted her hydralazine dose to twice a day but still experiences fatigue.  She has not been formally tested for sleep apnea but feels tired upon waking and experiences sensations of suffocation during sleep, described as feeling like she is 'in a tight area.' A retina doctor suggested she might have sleep apnea based on her conversation.  She is on an iron supplement   Synthroid 88 mcg for thyroid management.   She receives Leqvio injections every six months for cholesterol management but has not received recent updates on her cholesterol levels.  She mentions swelling in her right leg, which has been larger than the left since her hip replacement surgery. She is unsure if this is related to arthritis or venous insufficiency.      Review of Systems:  Review of Systems  Constitutional:  Positive for malaise/fatigue. Negative for chills, fever and weight loss.  HENT:  Negative for tinnitus.   Respiratory:  Positive for shortness of breath (at times). Negative for cough and sputum production.   Cardiovascular:  Negative for chest pain, palpitations and leg swelling.  Gastrointestinal:  Negative for abdominal pain, constipation, diarrhea and heartburn.  Genitourinary:  Negative for dysuria, frequency and urgency.  Musculoskeletal:  Negative for back pain, falls, joint pain and myalgias.  Skin: Negative.   Neurological:  Negative for dizziness and headaches.  Psychiatric/Behavioral:  Negative for depression and memory loss. The patient does not have insomnia.     Past Medical History:  Diagnosis Date   Anemia    Per  [facility] New Patient Packet   Asthma    Per [facility] New Patient Packet   Bilateral carotid bruits 02/12/2019   Coronary artery disease    Glaucoma    Per [facility] New Patient Packet   H/O bone density study    Per [facility] New Patient Packet   H/O heart artery stent    Per [facility] New Patient Packet   H/O mammogram     Per [facility] New Patient Packet   Heart murmur    Per [facility] New Patient Packet   Hyperlipidemia    Hypertension    Hypothyroid    Per [facility] New Patient Packet   Nerve pain    Per [facility] New Patient Packet   Peripheral artery disease (HCC) 02/12/2019   Prediabetes    Per [facility] New Patient Packet   Shingles    Per [facility] New Patient Packet   Stage 3 chronic kidney disease (HCC)    Per [facility] New Patient Packet   Past Surgical History:  Procedure Laterality Date   BREAST LUMPECTOMY     1980's Per [facility] New Patient Packet   CATARACT EXTRACTION     2018, Per [facility] New Patient Packet   COLONOSCOPY     Per [facility] New Patient Packet   HIP SURGERY Right 10/05/2018   TONSILLECTOMY     1950's Per [facility] New Patient Packet   Social History:   reports that she quit smoking about 52 years ago. Her smoking use included cigarettes. She started smoking about 59 years ago. She has a 1.8 pack-year smoking history. She has never used smokeless tobacco. She reports that she does not drink alcohol and does not use drugs.  Family History  Problem Relation Age of Onset   Tuberculosis Mother    Heart disease Father    Schizophrenia Daughter     Medications: Patient's Medications  New Prescriptions   No medications on file  Previous Medications   ALBUTEROL (PROVENTIL HFA;VENTOLIN HFA) 108 (90 BASE) MCG/ACT INHALER    Inhale 2 puffs into the lungs as needed.   ASPIRIN (ASPIRIN CHILDRENS) 81 MG CHEWABLE TABLET    Chew 1 tablet (81 mg total) by mouth daily.   BUDESONIDE-FORMOTEROL (SYMBICORT) 160-4.5 MCG/ACT INHALER    Inhale 2 puffs into the lungs 2 (two) times daily.   CHOLECALCIFEROL 50 MCG (2000 UT) CAPS    Take 1 capsule by mouth daily.   CYANOCOBALAMIN (VITAMIN B12) 1000 MCG TABLET    Take 1,000 mcg by mouth daily.   FERROUS SULFATE 325 (65 FE) MG EC TABLET    Take 1 tablet (325 mg total) by mouth daily with breakfast.   INCLISIRAN (LEQVIO) 284 MG/1.5ML SOSY INJECTION    Inject 284 mg into the skin every 6 (six)  months.   LATANOPROST (XALATAN) 0.005 % OPHTHALMIC SOLUTION    Place 1 drop into both eyes at bedtime.   LEVOTHYROXINE (SYNTHROID) 88 MCG TABLET    TAKE 1 TABLET(88 MCG) BY MOUTH DAILY BEFORE BREAKFAST   OLMESARTAN-HYDROCHLOROTHIAZIDE (BENICAR HCT) 40-12.5 MG TABLET    Take 1 tablet by mouth daily.  Modified Medications   Modified Medication Previous Medication   HYDRALAZINE (APRESOLINE) 25 MG TABLET hydrALAZINE (APRESOLINE) 50 MG tablet      25 mg by mouth at 9 am and 2pm, 50 mg at 8 pm    Take 1 tablet (50 mg total) by mouth 2 (two) times daily.  Discontinued Medications   No medications on file    Physical Exam:  Vitals:  09/12/23 0908  BP: 127/88  Pulse: 83  Resp: 18  Temp: (!) 96.5 F (35.8 C)  SpO2: 97%  Weight: 212 lb 9.6 oz (96.4 kg)  Height: 5' 5.5" (1.664 m)   Body mass index is 34.84 kg/m. Wt Readings from Last 3 Encounters:  09/12/23 212 lb 9.6 oz (96.4 kg)  05/12/23 210 lb 6.4 oz (95.4 kg)  05/06/23 209 lb 9.6 oz (95.1 kg)    Physical Exam Constitutional:      General: She is not in acute distress.    Appearance: She is well-developed. She is not diaphoretic.  HENT:     Head: Normocephalic and atraumatic.     Mouth/Throat:     Pharynx: No oropharyngeal exudate.  Eyes:     Conjunctiva/sclera: Conjunctivae normal.     Pupils: Pupils are equal, round, and reactive to light.  Cardiovascular:     Rate and Rhythm: Normal rate and regular rhythm.     Heart sounds: Normal heart sounds.  Pulmonary:     Effort: Pulmonary effort is normal.     Breath sounds: Normal breath sounds.  Abdominal:     General: Bowel sounds are normal.     Palpations: Abdomen is soft.  Musculoskeletal:     Cervical back: Normal range of motion and neck supple.     Right lower leg: No edema.     Left lower leg: No edema.  Skin:    General: Skin is warm and dry.  Neurological:     Mental Status: She is alert.  Psychiatric:        Mood and Affect: Mood normal.     Labs  reviewed: Basic Metabolic Panel: Recent Labs    01/10/23 0959 03/31/23 1015 05/12/23 1034  NA 141 141 141  K 4.7 3.7 4.7  CL 104 106 106  CO2 29 28 29   GLUCOSE 88 89 95  BUN 24 18 16   CREATININE 1.23* 1.61* 1.29*  CALCIUM 10.2 9.8 10.2  TSH 0.92  --   --    Liver Function Tests: Recent Labs    10/08/22 1014 03/31/23 1015  AST 22 39  ALT 16 24  ALKPHOS 59 56  BILITOT 0.4 0.4  PROT 7.3 7.5  ALBUMIN 4.0 4.1   No results for input(s): "LIPASE", "AMYLASE" in the last 8760 hours. No results for input(s): "AMMONIA" in the last 8760 hours. CBC: Recent Labs    10/08/22 1014 03/31/23 1015  WBC 5.7 7.2  NEUTROABS 2.4 3.2  HGB 11.6* 12.3  HCT 35.1* 37.6  MCV 88.6 90.8  PLT 261 298   Lipid Panel: Recent Labs    01/10/23 0959  CHOL 243*  HDL 76  LDLCALC 149*  TRIG 79  CHOLHDL 3.2   TSH: Recent Labs    01/10/23 0959  TSH 0.92   A1C: No results found for: "HGBA1C"   Assessment/Plan Assessment and Plan    Snoring  Patient reports feeling suffocated during sleep and waking up tired. No formal testing has been done yet. -Refer to pulmonary office for consultation with a sleep specialist and potential sleep study.  Asthma Patient reports occasional wheezing and voice changes, but no significant shortness of breath. Currently on Symbicort and Albuterol as needed. -Continue Symbicort 2 puffs twice daily and Albuterol as needed.  Hypertension Patient reports feeling drugged and tired after taking Hydralazine. Blood pressure is well-controlled on current regimen of Olmesartan/Hydrochlorothiazide and Hydralazine. -Reduce Hydralazine to 25mg  at 9 AM, 25mg  at 2 PM, and 50mg  at  8 PM. -Advise patient to monitor blood pressure at home and contact office if significant changes occur.  Hyperlipidemia Patient is on leqvio injections every six months, prescribed by Dr. Jacinto Halim. No recent lipid panel results available. -Order lipid panel today to assess efficacy.  Iron  Deficiency Patient is on iron supplements. No recent lab results available. -Order labs today to assess hgb.  Hypothyroidism Patient is on Synthroid . Last TSH was normal in June. -Order TSH today to assess thyroid function.  General Health Maintenance / Followup Plans -Schedule annual wellness visit for March 28th at 9 AM. -Schedule follow-up appointment in six months. -Advise patient to contact office if blood pressure changes significantly with new Hydralazine regimen.     Return in about 6 months (around 03/11/2024) for routine follow up, labs at time of visit.:   Shanda Bumps K. [facility]ag[facility] [phone]

## 2023-09-13 LAB — COMPLETE METABOLIC PANEL WITH GFR
AG Ratio: 1.4 (calc) (ref 1.0–2.5)
ALT: 12 U/L (ref 6–29)
AST: 19 U/L (ref 10–35)
Albumin: 4.2 g/dL (ref 3.6–5.1)
Alkaline phosphatase (APISO): 62 U/L (ref 37–153)
BUN/Creatinine Ratio: 12 (calc) (ref 6–22)
BUN: 18 mg/dL (ref 7–25)
CO2: 29 mmol/L (ref 20–32)
Calcium: 10.2 mg/dL (ref 8.6–10.4)
Chloride: 105 mmol/L (ref 98–110)
Creat: 1.52 mg/dL — ABNORMAL HIGH (ref 0.60–0.95)
Globulin: 3 g/dL (ref 1.9–3.7)
Glucose, Bld: 94 mg/dL (ref 65–99)
Potassium: 4.5 mmol/L (ref 3.5–5.3)
Sodium: 143 mmol/L (ref 135–146)
Total Bilirubin: 0.5 mg/dL (ref 0.2–1.2)
Total Protein: 7.2 g/dL (ref 6.1–8.1)
eGFR: 33 mL/min/{1.73_m2} — ABNORMAL LOW (ref >=60)

## 2023-09-13 LAB — CBC WITH DIFFERENTIAL/PLATELET
Absolute Lymphocytes: 2340 {cells}/uL (ref 850–3900)
Absolute Monocytes: 390 {cells}/uL (ref 200–950)
Basophils Absolute: 50 {cells}/uL (ref 0–200)
Basophils Relative: 1 %
Eosinophils Absolute: 270 {cells}/uL (ref 15–500)
Eosinophils Relative: 5.4 %
HCT: 38.6 % (ref 35.0–45.0)
Hemoglobin: 12.2 g/dL (ref 11.7–15.5)
MCH: 28.9 pg (ref 27.0–33.0)
MCHC: 31.6 g/dL — ABNORMAL LOW (ref 32.0–36.0)
MCV: 91.5 fL (ref 80.0–100.0)
MPV: 10.1 fL (ref 7.5–12.5)
Monocytes Relative: 7.8 %
Neutro Abs: 1950 {cells}/uL (ref 1500–7800)
Neutrophils Relative %: 39 %
Platelets: 282 10*3/uL (ref 140–400)
RBC: 4.22 10*6/uL (ref 3.80–5.10)
RDW: 13.1 % (ref 11.0–15.0)
Total Lymphocyte: 46.8 %
WBC: 5 10*3/uL (ref 3.8–10.8)

## 2023-09-13 LAB — LIPID PANEL
Cholesterol: 218 mg/dL — ABNORMAL HIGH (ref <200)
HDL: 82 mg/dL (ref >=50)
LDL Cholesterol (Calc): 115 mg/dL — ABNORMAL HIGH
Non-HDL Cholesterol (Calc): 136 mg/dL — ABNORMAL HIGH (ref <130)
Total CHOL/HDL Ratio: 2.7 (calc) (ref <5.0)
Triglycerides: 105 mg/dL (ref <150)

## 2023-09-13 LAB — TSH: TSH: 2.18 m[IU]/L (ref 0.40–4.50)

## 2023-09-15 ENCOUNTER — Encounter: Payer: Self-pay | Admitting: Podiatry

## 2023-09-15 ENCOUNTER — Ambulatory Visit (INDEPENDENT_AMBULATORY_CARE_PROVIDER_SITE_OTHER): Payer: Medicare Other | Admitting: Podiatry

## 2023-09-15 ENCOUNTER — Encounter: Payer: Self-pay | Admitting: Nurse Practitioner

## 2023-09-15 DIAGNOSIS — M79675 Pain in left toe(s): Secondary | ICD-10-CM | POA: Diagnosis not present

## 2023-09-15 DIAGNOSIS — D689 Coagulation defect, unspecified: Secondary | ICD-10-CM

## 2023-09-15 DIAGNOSIS — M79674 Pain in right toe(s): Secondary | ICD-10-CM | POA: Diagnosis not present

## 2023-09-15 DIAGNOSIS — B351 Tinea unguium: Secondary | ICD-10-CM | POA: Diagnosis not present

## 2023-09-15 NOTE — Progress Notes (Signed)
 This patient returns to my office for at risk foot care.  This patient requires this care by a professional since this patient will be at risk due to having chronic kidney disease and PAD.Brenda Barry  This patient is unable to cut nails herself since the patient cannot reach her nails.These nails are painful walking and wearing shoes.  This patient presents for at risk foot care today.    General Appearance  Alert, conversant and in no acute stress.  Vascular  Dorsalis pedis and posterior tibial  pulses are  weakly palpable  bilaterally.  Capillary return is within normal limits  bilaterally. Temperature is within normal limits  bilaterally.  Neurologic  Senn-Weinstein monofilament wire test within normal limits  bilaterally. Muscle power within normal limits bilaterally.  Nails Thick disfigured discolored nails with subungual debris  from hallux to fifth toes bilaterally. No evidence of bacterial infection or drainage bilaterally.  Orthopedic  No limitations of motion  feet .  No crepitus or effusions noted.  No bony pathology or digital deformities noted.  HAV  B/L.  Skin  normotropic skin with no porokeratosis noted bilaterally.  No signs of infections or ulcers noted.   Asymptomatic HD 5th B/L.  Onychomycosis  Pain in right toes  Pain in left toes  Consent was obtained for treatment procedures.   Mechanical debridement of nails 1-5  bilaterally performed with a nail nipper.  Filed with dremel without incident.    Return office visit   3 months                   Told patient to return for periodic foot care and evaluation due to potential at risk complications.   Helane Gunther DPM

## 2023-09-16 ENCOUNTER — Other Ambulatory Visit: Payer: Self-pay | Admitting: Nurse Practitioner

## 2023-09-19 ENCOUNTER — Other Ambulatory Visit: Payer: Self-pay

## 2023-09-19 DIAGNOSIS — D510 Vitamin B12 deficiency anemia due to intrinsic factor deficiency: Secondary | ICD-10-CM

## 2023-09-19 DIAGNOSIS — D472 Monoclonal gammopathy: Secondary | ICD-10-CM

## 2023-09-19 DIAGNOSIS — E538 Deficiency of other specified B group vitamins: Secondary | ICD-10-CM

## 2023-09-22 ENCOUNTER — Inpatient Hospital Stay: Payer: Medicare Other | Attending: Hematology

## 2023-09-22 DIAGNOSIS — E538 Deficiency of other specified B group vitamins: Secondary | ICD-10-CM

## 2023-09-22 DIAGNOSIS — Z79899 Other long term (current) drug therapy: Secondary | ICD-10-CM | POA: Diagnosis not present

## 2023-09-22 DIAGNOSIS — D472 Monoclonal gammopathy: Secondary | ICD-10-CM | POA: Insufficient documentation

## 2023-09-22 DIAGNOSIS — D510 Vitamin B12 deficiency anemia due to intrinsic factor deficiency: Secondary | ICD-10-CM | POA: Insufficient documentation

## 2023-09-22 LAB — CMP (CANCER CENTER ONLY)
ALT: 12 U/L (ref 0–44)
AST: 19 U/L (ref 15–41)
Albumin: 4.2 g/dL (ref 3.5–5.0)
Alkaline Phosphatase: 65 U/L (ref 38–126)
Anion gap: 5 (ref 5–15)
BUN: 17 mg/dL (ref 8–23)
CO2: 30 mmol/L (ref 22–32)
Calcium: 10.1 mg/dL (ref 8.9–10.3)
Chloride: 104 mmol/L (ref 98–111)
Creatinine: 1.29 mg/dL — ABNORMAL HIGH (ref 0.44–1.00)
GFR, Estimated: 41 mL/min — ABNORMAL LOW (ref >60)
Glucose, Bld: 87 mg/dL (ref 70–99)
Potassium: 3.8 mmol/L (ref 3.5–5.1)
Sodium: 139 mmol/L (ref 135–145)
Total Bilirubin: 0.4 mg/dL (ref 0.0–1.2)
Total Protein: 7.7 g/dL (ref 6.5–8.1)

## 2023-09-22 LAB — CBC WITH DIFFERENTIAL (CANCER CENTER ONLY)
Abs Immature Granulocytes: 0.01 10*3/uL (ref 0.00–0.07)
Basophils Absolute: 0.1 10*3/uL (ref 0.0–0.1)
Basophils Relative: 1 %
Eosinophils Absolute: 0.3 10*3/uL (ref 0.0–0.5)
Eosinophils Relative: 6 %
HCT: 39.9 % (ref 36.0–46.0)
Hemoglobin: 12.8 g/dL (ref 12.0–15.0)
Immature Granulocytes: 0 %
Lymphocytes Relative: 49 %
Lymphs Abs: 2.6 10*3/uL (ref 0.7–4.0)
MCH: 29.4 pg (ref 26.0–34.0)
MCHC: 32.1 g/dL (ref 30.0–36.0)
MCV: 91.5 fL (ref 80.0–100.0)
Monocytes Absolute: 0.3 10*3/uL (ref 0.1–1.0)
Monocytes Relative: 6 %
Neutro Abs: 2 10*3/uL (ref 1.7–7.7)
Neutrophils Relative %: 38 %
Platelet Count: 276 10*3/uL (ref 150–400)
RBC: 4.36 MIL/uL (ref 3.87–5.11)
RDW: 13.6 % (ref 11.5–15.5)
WBC Count: 5.3 10*3/uL (ref 4.0–10.5)
nRBC: 0 % (ref 0.0–0.2)

## 2023-09-22 LAB — VITAMIN B12: Vitamin B-12: 720 pg/mL (ref 180–914)

## 2023-09-22 LAB — IRON AND IRON BINDING CAPACITY (CC-WL,HP ONLY)
Iron: 63 ug/dL (ref 28–170)
Saturation Ratios: 21 % (ref 10.4–31.8)
TIBC: 294 ug/dL (ref 250–450)
UIBC: 231 ug/dL (ref 148–442)

## 2023-09-22 LAB — FERRITIN: Ferritin: 98 ng/mL (ref 11–307)

## 2023-09-25 LAB — MULTIPLE MYELOMA PANEL, SERUM
Albumin SerPl Elph-Mcnc: 3.7 g/dL (ref 2.9–4.4)
Albumin/Glob SerPl: 1.2 (ref 0.7–1.7)
Alpha 1: 0.2 g/dL (ref 0.0–0.4)
Alpha2 Glob SerPl Elph-Mcnc: 0.7 g/dL (ref 0.4–1.0)
B-Globulin SerPl Elph-Mcnc: 1 g/dL (ref 0.7–1.3)
Gamma Glob SerPl Elph-Mcnc: 1.4 g/dL (ref 0.4–1.8)
Globulin, Total: 3.3 g/dL (ref 2.2–3.9)
IgA: 147 mg/dL (ref 64–422)
IgG (Immunoglobin G), Serum: 1552 mg/dL (ref 586–1602)
IgM (Immunoglobulin M), Srm: 37 mg/dL (ref 26–217)
M Protein SerPl Elph-Mcnc: 0.9 g/dL — ABNORMAL HIGH
Total Protein ELP: 7 g/dL (ref 6.0–8.5)

## 2023-10-06 ENCOUNTER — Inpatient Hospital Stay (HOSPITAL_BASED_OUTPATIENT_CLINIC_OR_DEPARTMENT_OTHER): Payer: Medicare Other | Admitting: Hematology

## 2023-10-06 DIAGNOSIS — D510 Vitamin B12 deficiency anemia due to intrinsic factor deficiency: Secondary | ICD-10-CM

## 2023-10-06 DIAGNOSIS — D472 Monoclonal gammopathy: Secondary | ICD-10-CM

## 2023-10-06 NOTE — Progress Notes (Signed)
 HEMATOLOGY/ONCOLOGY PHONE VISIT NOTE  Date of Service: 10/06/23    Patient Care Team: Sharon Seller, NP as PCP - General (Geriatric Medicine) Helane Gunther, DPM as Consulting Physician (Podiatry) Yates Decamp, MD as Consulting Physician (Cardiology) Ernesto Rutherford, MD as Referring Physician (Ophthalmology) Estanislado Emms, MD as Consulting Physician (Nephrology)  CHIEF COMPLAINTS/PURPOSE OF CONSULTATION:  Continued follow-up for MGUS and management of pernicious anemia  HISTORY OF PRESENTING ILLNESS:  Brenda Barry is a wonderful 88 y.o. female who has been referred to [place] by Dr. Antony Haste for evaluation and management of Monoclonal Paraproteinemia. The pt reports that she is doing well overall.   The pt reports that she has had sciatica in the last year with referred pain down her leg, caused by a pinched nerve at L4/5.  The pt is intending to see Dr. Amada Jupiter in neurosurgery soon. The pt's lower back pain has been evaluated with an MRI most recently on 04/24/18. The pt is also being evaluated by cardiology.  The pt denies any neck pain whatsoever and denies any new bone pains.   The pt notes that her kidney functions have been stable recently and has been pursuing care with Dr. Casimiro Needle at [facility]. The pt notes that her blood pressure has been high, and is elevated more when she sees physicians. She has taken HCTZ, Toprol-XL, and Hydralazine. The pt notes that her thyroid medication has recently been adjusted.   Of note prior to the patient's presentation, the pt had a Bone Survey on 01/03/17 which revealed No definite lytic lesions within the axial skeleton. 2. Mixed lytic and blastic lesion posteriorly at C7. CT or MRI could be used for further evaluation of the cervical spine. 3. 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 No discrete lytic lesions are present.   Subsequently the pt then had a  CT Cervical Spine on 02/21/17 which revealed CT scan does not show a convincing abnormality within the C7 vertebral body. However, within the posterior C3 vertebral body, there is a well-circumscribed lucent area measuring 8 x 4 x 5 mm which, though not specific or conclusive, could represent a focus of myeloma.  Most recent lab results (04/21/18) of CMP is as follows: all values are WNL except for Glucose at 104, Creatinine at 1.07, GFR at 57, BUN at 10, Sodium at 145, Phosphorous at 2.2. 04/21/18 SPEP revealed all values WNL except for M-spike at 0.7, with IgG monoclonal protein with kappa light chain specificity.   On review of systems, pt reports sciatica, stable energy levels, stable lower back pain, and denies any neck pain, new bone pains, abdominal pains, chest wall pain, and any other symptoms.   On PMHx the pt reports Osteoporosis, Hypothyroidism, Hyperlipidemia, CAD with 5p stent place in late 90s, benign lumpectomy of right breast, HTN, IgG monoclonal gammopathy, CKD stage III.   Interval History:   Brenda Barry is here for her 44-month follow-up for continued evaluation and management of pernicious anemia and surveillance of MGUS.   I connected with [name] on 10/06/23 at  3:30 PM EDT by telephone visit and verified that I am speaking with the correct person using two identifiers.   I last connected with the patient, tele-med visit, on 04/14/2023 and she was doing well overall.   Patient notes she has been doing well overall since our last visit. She denies any new infection issues, new bone pain, fever, chills, night  sweats, unexpected weight loss, back pain, chest pain, abdominal pain, or leg swelling. She does report of losing around 5 lbs since our last visit due to diet change.   I discussed the limitations, risks, security and privacy concerns of performing an evaluation and management service by telemedicine and the availability of in-person appointments. I also  discussed with the patient that there may be a patient responsible charge related to this service. The patient expressed understanding and agreed to proceed.   Other persons participating in the visit and their role in the encounter: none   Patient's location: home  Provider's location: [facility]   Chief Complaint: Continued follow-up for MGUS and management of pernicious anemia     MEDICAL HISTORY:  HTN HLD Anxiety Sciatica  SURGICAL HISTORY: Past Surgical History:  Procedure Laterality Date   BREAST LUMPECTOMY     1980's Per [facility] New Patient Packet   CATARACT EXTRACTION     2018, Per [facility] New Patient Packet   COLONOSCOPY     Per [facility] New Patient Packet   HIP SURGERY Right 10/05/2018   TONSILLECTOMY     1950's Per [facility] New Patient Packet    SOCIAL HISTORY: Social History   Socioeconomic History   Marital status: Widowed    Spouse name: Not on file   Number of children: 3   Years of education: Not on file   Highest education level: Not on file  Occupational History   Not on file  Tobacco Use   Smoking status: Former    Current packs/day: 0.00    Average packs/day: 0.3 packs/day for 7.0 years (1.8 ttl pk-yrs)    Types: Cigarettes    Start date: 04/18/1964    Quit date: 04/19/1971    Years since quitting: 52.5   Smokeless tobacco: Never  Vaping Use   Vaping status: Never Used  Substance and Sexual Activity   Alcohol use: No   Drug use: No   Sexual activity: Not on file  Other Topics Concern   Not on file  Social History Narrative   Diet: all types of foods       Caffeine: Cola and coffee      Married, if yes what year: Widowed, 1962 year married       Do you live in a house, apartment, assisted living, condo, trailer, ect: House       Is it one or more stories: One stories       How many persons live in your home? 1 daughter, during school 2 grandsons       Pets: None      Highest level or education completed: 12th grade       Current/Past profession:  Visual merchandiser       Exercise: NO        Type and how often:          Living Will: let blank    DNR: No   POA/HPOA: Yes      Functional Status:   Do you have difficulty bathing or dressing yourself? No   Do you have difficulty preparing food or eating? No   Do you have difficulty managing your medications? No   Do you have difficulty managing your finances? No   Do you have difficulty affording your medications? No   Social Drivers of Corporate investment banker Strain: Low Risk  (08/07/2022)   [facility]   Overall Financial Resource Strain (CARDIA)  Difficulty of Paying Living Expenses: Not hard at all  Food Insecurity: No Food Insecurity (08/07/2022)   [facility], [facility]   Hunger Vital Sign    Worried About Running Out of Food in the Last Year: Never true    Ran Out of Food in the Last Year: Never true  Transportation Needs: No Transportation Needs (08/07/2022)   Received from [facility], [facility]   [facility] - Transportation    Lack of Transportation (Medical): No    Lack of Transportation (Non-Medical): No  Physical Activity: Insufficiently Active (08/07/2022)   Received from [facility], [facility]   Exercise Vital Sign    Days of Exercise per Week: 7 days    Minutes of Exercise per Session: 20 min  Stress: No Stress Concern Present (08/07/2022)   Received from [facility], [facility] - Occupational Stress Questionnaire    Feeling of Stress : Only a little  Social Connections: Moderately Integrated (08/07/2022)   Received from [facility], [facility]   Social Network    How would you rate your social network (family, work, friends)?: Adequate participation with social networks  Intimate Partner Violence: Not At Risk (08/07/2022)   Received from [facility], [facility]   HITS    Over the last 12 months how often did your partner physically hurt  you?: Never    Over the last 12 months how often did your partner insult you or talk down to you?: Never    Over the last 12 months how often did your partner threaten you with physical harm?: Never    Over the last 12 months how often did your partner scream or curse at you?: Never    FAMILY HISTORY: Family History  Problem Relation Age of Onset   Tuberculosis Mother    Heart disease Father    Schizophrenia Daughter     ALLERGIES:  is allergic to amlodipine besylate, atorvastatin, isosorbide dinitrate, breo ellipta [fluticasone furoate-vilanterol], and rosuvastatin.  MEDICATIONS:  Current Outpatient Medications  Medication Sig Dispense Refill   albuterol (PROVENTIL HFA;VENTOLIN HFA) 108 (90 Base) MCG/ACT inhaler Inhale 2 puffs into the lungs as needed.     aspirin (ASPIRIN CHILDRENS) 81 MG chewable tablet Chew 1 tablet (81 mg total) by mouth daily.     budesonide-formoterol (SYMBICORT) 160-4.5 MCG/ACT inhaler Inhale 2 puffs into the lungs 2 (two) times daily. 10.2 g 3   Cholecalciferol 50 MCG (2000 UT) CAPS Take 1 capsule by mouth daily.     cyanocobalamin (VITAMIN B12) 1000 MCG tablet Take 1,000 mcg by mouth daily.     ferrous sulfate 325 (65 FE) MG EC tablet Take 1 tablet (325 mg total) by mouth daily with breakfast.     hydrALAZINE (APRESOLINE) 25 MG tablet 25 mg by mouth at 9 am and 2pm, 50 mg at 8 pm 360 tablet 1   inclisiran (LEQVIO) 284 MG/1.5ML SOSY injection Inject 284 mg into the skin every 6 (six) months.     latanoprost (XALATAN) 0.005 % ophthalmic solution Place 1 drop into both eyes at bedtime.     levothyroxine (SYNTHROID) 88 MCG tablet TAKE 1 TABLET(88 MCG) BY MOUTH DAILY BEFORE BREAKFAST 90 tablet 1   olmesartan-hydrochlorothiazide (BENICAR HCT) 40-12.5 MG tablet TAKE 1 TABLET BY MOUTH DAILY 90 tablet 1   No current facility-administered medications for this visit.    REVIEW OF SYSTEMS:    10 Point review of Systems was done is negative  except as noted above.    PHYSICAL EXAMINATION: Telemedicine visit   LABORATORY DATA:  I have reviewed the data as listed  .    Latest Ref Rng & Units 09/22/2023   10:43 AM 09/12/2023    9:54 AM 03/31/2023   10:15 AM  CBC  WBC 4.0 - 10.5 K/uL 5.3  5.0  7.2   Hemoglobin 12.0 - 15.0 g/dL 09.8  11.9  14.7   Hematocrit 36.0 - 46.0 % 39.9  38.6  37.6   Platelets 150 - 400 K/uL 276  282  298        Latest Ref Rng & Units 09/22/2023   10:43 AM 09/12/2023    9:54 AM 05/12/2023   10:34 AM  CMP  Glucose 70 - 99 mg/dL 87  94  95   BUN 8 - 23 mg/dL 17  18  16    Creatinine 0.44 - 1.00 mg/dL 8.29  5.62  1.30   Sodium 135 - 145 mmol/L 139  143  141   Potassium 3.5 - 5.1 mmol/L 3.8  4.5  4.7   Chloride 98 - 111 mmol/L 104  105  106   CO2 22 - 32 mmol/L 30  29  29    Calcium 8.9 - 10.3 mg/dL 86.5  78.4  69.6   Total Protein 6.5 - 8.1 g/dL 7.7  7.2    Total Bilirubin 0.0 - 1.2 mg/dL 0.4  0.5    Alkaline Phos 38 - 126 U/L 65     AST 15 - 41 U/L 19  19    ALT 0 - 44 U/L 12  12     . Lab Results  Component Value Date   IRON 63 09/22/2023   TIBC 294 09/22/2023   IRONPCTSAT 21 09/22/2023   (Iron and TIBC)  Lab Results  Component Value Date   FERRITIN 98 09/22/2023   B12 -743-->1414    RADIOGRAPHIC STUDIES: I have personally reviewed the radiological images as listed and agreed with the findings in the report. No results found.  ASSESSMENT & PLAN:   88 y.o. female with  1. MGUS- Monoclonal Gammopathy or undetermined significance. 01/03/17 Bone Survey revealed No definite lytic lesions within the axial skeleton. 2. Mixed lytic and blastic lesion posteriorly at C7. CT or MRI could be used for further evaluation of the cervical spine. 3. 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 No discrete lytic lesions are present.  02/21/17 CT Cervical Spine revealed CT scan does not show a convincing abnormality within the C7 vertebral body. However,  within the posterior C3 vertebral body, there is a well-circumscribed lucent area measuring 8 x 4 x 5 mm which, though not specific or conclusive, could represent a focus of myeloma.  04/24/18 MRI Lumbar Spine revealed degenerative disc disease, multilevel disc bulges and disc osteophytes and advised that the pt speak with her PCP regarding 2.9cm cystic lesion in right adnexa  Labs from initial presentation from 04/21/18: M Protein at 0.7g  2. Pernicious anemia B12 levels today stable at 743.  3. HTN -chronically uncontrolled . Element of white coat hypertension. Patient notes BP much better at home.  PLAN: -Discussed lab results from 09/22/2023 in detail with the patient. CBC stable. CMP stable with slightly elevated creatinine of 1.29. Iron saturation of 21 with ferritin of 98. Vitamin B-12 level of 720.  -Discussed multiple myeloma panel result from 09/22/2023 in detail with the patient. Showed improved M-protein from 1.1 to  0.9.   -No lab or clinical evidence of MGUS progression. -continue f/u with PCP to optimize BP control. -continue Iron polysaccharide 150mg  po daily -RTC with labs with Dr. Candise Che in 6 months. If everything remains stable, we will alternate visit with PCP.  -Phone visit with Dr. Candise Che in 6 months -Labs 2 weeks prior to phone visit  FOLLOW-UP: Phone visit with Dr. Candise Che in 6 months Labs 2 weeks prior to phone visit   The total time spent in the appointment was 20 minutes* .  All of the patient's questions were answered with apparent satisfaction. The patient knows to call the clinic with any problems, questions or concerns.   Wyvonnia Lora MD MS AAHIVMS [facility] Hematology/Oncology Physician [facility]  .*Total Encounter Time as defined by the Centers for Medicare and Medicaid Services includes, in addition to the face-to-face time of a patient visit (documented in the note above) non-face-to-face time: obtaining and reviewing outside history, ordering  and reviewing medications, tests or procedures, care coordination (communications with other health care professionals or caregivers) and documentation in the medical record.   I,Param Shah,acting as a Neurosurgeon for Wyvonnia Lora, MD.,have documented all relevant documentation on the behalf of Wyvonnia Lora, MD,as directed by  Wyvonnia Lora, MD while in the presence of Wyvonnia Lora, MD.  .I have reviewed the above documentation for accuracy and completeness, and I agree with the above. Johney Maine MD

## 2023-10-07 ENCOUNTER — Telehealth: Payer: Self-pay | Admitting: Hematology

## 2023-10-07 NOTE — Telephone Encounter (Signed)
 Left patient a vm regarding upcoming appointment

## 2023-10-13 ENCOUNTER — Encounter: Payer: Self-pay | Admitting: Cardiology

## 2023-10-14 ENCOUNTER — Other Ambulatory Visit: Payer: Self-pay | Admitting: Nurse Practitioner

## 2023-10-14 DIAGNOSIS — J4540 Moderate persistent asthma, uncomplicated: Secondary | ICD-10-CM

## 2023-10-14 NOTE — Telephone Encounter (Signed)
 High risk warning populated when attempting to refill medication  Please review and approval if appropriate   Thanks,  [facility] W/CMA

## 2023-10-16 ENCOUNTER — Other Ambulatory Visit: Payer: Self-pay | Admitting: Nurse Practitioner

## 2023-10-16 DIAGNOSIS — J4540 Moderate persistent asthma, uncomplicated: Secondary | ICD-10-CM

## 2023-10-16 NOTE — Telephone Encounter (Signed)
 Please call patient to confirm she has been taking this medication currently

## 2023-10-16 NOTE — Telephone Encounter (Signed)
 Forwarded to [place] to address.      Pharmacy comment: Patient has recently indicated to [place] that they are either allergic or sensitive to Corticosteroids. There is possible cross-allergy. Christina Gintz we dispense? Please respond with appropriate changes or comment to Pharmacy. Patient has recently indicated to [place] that they are either allergic or sensitive to Corticosteroids. There is possible cross-allergy. Sharron Petruska we dispense? Please respond with appropriate changes or comment to [employer].

## 2023-10-17 ENCOUNTER — Encounter: Payer: Self-pay | Admitting: Nurse Practitioner

## 2023-10-17 ENCOUNTER — Ambulatory Visit (INDEPENDENT_AMBULATORY_CARE_PROVIDER_SITE_OTHER): Payer: Medicare Other | Admitting: Nurse Practitioner

## 2023-10-17 VITALS — BP 127/78 | HR 72 | Temp 96.5°F | Resp 18 | Ht 65.5 in | Wt 211.4 lb

## 2023-10-17 DIAGNOSIS — Z0000 Encounter for general adult medical examination without abnormal findings: Secondary | ICD-10-CM | POA: Diagnosis not present

## 2023-10-17 NOTE — Telephone Encounter (Signed)
 Mychart message to to patient to pose the question that provider asked. Awaiting reply

## 2023-10-17 NOTE — Patient Instructions (Signed)
  Brenda Barry , Thank you for taking time to come for your Medicare Wellness Visit. I appreciate your ongoing commitment to your health goals. Please review the following plan we discussed and let me know if I can assist you in the future.   COVID booster at the pharmacy.   This is a list of the screening recommended for you and due dates:  Health Maintenance  Topic Date Due   Flu Shot  10/20/2023*   COVID-19 Vaccine (3 - Moderna risk series) 10/16/2024*   Zoster (Shingles) Vaccine (1 of 2) 10/16/2024*   Medicare Annual Wellness Visit  10/16/2024   DTaP/Tdap/Td vaccine (2 - Tdap) 05/14/2033   Pneumonia Vaccine  Completed   DEXA scan (bone density measurement)  Completed   HPV Vaccine  Aged Out  *Topic was postponed. The date shown is not the original due date.

## 2023-10-17 NOTE — Progress Notes (Signed)
 Subjective:   Brenda Barry is a 88 y.o. female who presents for Medicare Annual (Subsequent) preventive examination.  Visit Complete: In person @ [facility]   Cardiac Risk Factors include: advanced age (>39men, >54 women);hypertension;obesity (BMI >30kg/m2);dyslipidemia;family history of premature cardiovascular disease     Objective:    Today's Vitals   10/17/23 0903 10/17/23 0926  BP: 127/78   Pulse: 72   Resp: 18   Temp: (!) 96.5 F (35.8 C)   SpO2: 98%   Weight: 211 lb 6.4 oz (95.9 kg)   Height: 5' 5.5" (1.664 m)   PainSc: 0-No pain 0-No pain   Body mass index is 34.64 kg/m.     10/17/2023    9:11 AM 10/17/2023    9:03 AM 09/12/2023    9:09 AM 03/25/2023    3:51 PM 01/10/2023    9:10 AM 09/28/2019    9:19 AM 05/19/2018   10:03 AM  Advanced Directives  Does Patient Have a Medical Advance Directive? No No No Yes Yes No No  Type of Aeronautical engineer of [place];Living will;Out of facility DNR (pink MOST or yellow form) Healthcare Power of [place];Living will    Does patient want to make changes to medical advance directive?    No - Patient declined No - Patient declined    Copy of Healthcare Power of Attorney in Chart?    No - copy requested No - copy requested    Would patient like information on creating a medical advance directive? No - Patient declined No - Patient declined No - Patient declined    No - Patient declined    Current Medications (verified) Outpatient Encounter Medications as of 10/17/2023  Medication Sig   albuterol (PROVENTIL HFA;VENTOLIN HFA) 108 (90 Base) MCG/ACT inhaler Inhale 2 puffs into the lungs as needed.   aspirin (ASPIRIN CHILDRENS) 81 MG chewable tablet Chew 1 tablet (81 mg total) by mouth daily.   BREYNA 160-4.5 MCG/ACT inhaler USE 2 INHALATIONS ORALLY   TWICE DAILY   Cholecalciferol 50 MCG (2000 UT) CAPS Take 1 capsule by mouth daily.   cyanocobalamin (VITAMIN B12) 1000 MCG tablet Take 1,000 mcg by mouth daily.   ferrous  sulfate 325 (65 FE) MG EC tablet Take 1 tablet (325 mg total) by mouth daily with breakfast.   hydrALAZINE (APRESOLINE) 25 MG tablet 25 mg by mouth at 9 am and 2pm, 50 mg at 8 pm   inclisiran (LEQVIO) 284 MG/1.5ML SOSY injection Inject 284 mg into the skin every 6 (six) months.   latanoprost (XALATAN) 0.005 % ophthalmic solution Place 1 drop into both eyes at bedtime.   levothyroxine (SYNTHROID) 88 MCG tablet TAKE 1 TABLET(88 MCG) BY MOUTH DAILY BEFORE BREAKFAST   olmesartan-hydrochlorothiazide (BENICAR HCT) 40-12.5 MG tablet TAKE 1 TABLET BY MOUTH DAILY   No facility-administered encounter medications on file as of 10/17/2023.    Allergies (verified) Amlodipine besylate, Atorvastatin, Isosorbide dinitrate, Breo ellipta [fluticasone furoate-vilanterol], and Rosuvastatin   History: Past Medical History:  Diagnosis Date   Anemia    Per [facility] New Patient Packet   Asthma    Per [facility] New Patient Packet   Bilateral carotid bruits 02/12/2019   Coronary artery disease    Glaucoma    Per [facility] New Patient Packet   H/O bone density study    Per [facility] New Patient Packet   H/O heart artery stent    Per [facility] New Patient Packet   H/O mammogram    Per  [facility] New Patient Packet   Heart murmur    Per [facility] New Patient Packet   Hyperlipidemia    Hypertension    Hypothyroid    Per [facility] New Patient Packet   Nerve pain    Per [facility] New Patient Packet   Peripheral artery disease (HCC) 02/12/2019   Prediabetes    Per [facility] New Patient Packet   Shingles    Per [facility] New Patient Packet   Stage 3 chronic kidney disease (HCC)    Per [facility] New Patient Packet   Past Surgical History:  Procedure Laterality Date   BREAST LUMPECTOMY     1980's Per [facility] New Patient Packet   CATARACT EXTRACTION     2018, Per [facility] New Patient Packet   COLONOSCOPY     Per [facility] New Patient Packet   HIP SURGERY Right 10/05/2018   TONSILLECTOMY     1950's Per [facility] New Patient Packet   Family History  Problem Relation Age of Onset    Tuberculosis Mother    Heart disease Father    Schizophrenia Daughter    Social History   Socioeconomic History   Marital status: Widowed    Spouse name: Not on file   Number of children: 3   Years of education: Not on file   Highest education level: Not on file  Occupational History   Not on file  Tobacco Use   Smoking status: Former    Current packs/day: 0.00    Average packs/day: 0.3 packs/day for 7.0 years (1.8 ttl pk-yrs)    Types: Cigarettes    Start date: 04/18/1964    Quit date: 04/19/1971    Years since quitting: 52.5   Smokeless tobacco: Never  Vaping Use   Vaping status: Never Used  Substance and Sexual Activity   Alcohol use: No   Drug use: No   Sexual activity: Not on file  Other Topics Concern   Not on file  Social History Narrative   Diet: all types of foods       Caffeine: Cola and coffee      Married, if yes what year: Widowed, 1962 year married       Do you live in a house, apartment, assisted living, condo, trailer, ect: House       Is it one or more stories: One stories       How many persons live in your home? 1 daughter, during school 2 grandsons       Pets: None      Highest level or education completed: 12th grade       Current/Past profession: Visual merchandiser       Exercise: NO        Type and how often:          Living Will: let blank    DNR: No   POA/HPOA: Yes      Functional Status:   Do you have difficulty bathing or dressing yourself? No   Do you have difficulty preparing food or eating? No   Do you have difficulty managing your medications? No   Do you have difficulty managing your finances? No   Do you have difficulty affording your medications? No   Social Drivers of Corporate investment banker Strain: Low Risk  (08/07/2022)   Received from [facility], [facility]   Overall Financial Resource Strain (CARDIA)    Difficulty of Paying Living Expenses: Not hard at all  Food Insecurity: No Food Insecurity (08/07/2022)  Received from [facility], [facility]   Hunger Vital Sign    Worried About Running Out of Food in the Last Year: Never true    Ran Out of Food in the Last Year: Never true  Transportation Needs: No Transportation Needs (08/07/2022)   Received from [facility], [facility]   [facility] - Transportation    Lack of Transportation (Medical): No    Lack of Transportation (Non-Medical): No  Physical Activity: Insufficiently Active (08/07/2022)   Received from [facility], [facility]   Exercise Vital Sign    Days of Exercise per Week: 7 days    Minutes of Exercise per Session: 20 min  Stress: No Stress Concern Present (08/07/2022)   Received from [facility], [facility] - Occupational Stress Questionnaire    Feeling of Stress : Only a little  Social Connections: Moderately Integrated (08/07/2022)   Received from [facility], [facility]   Social Network    How would you rate your social network (family, work, friends)?: Adequate participation with social networks    Tobacco Counseling Counseling given: Not Answered   Clinical Intake:  Pre-visit preparation completed: Yes  Pain : No/denies pain Pain Score: 0-No pain     BMI - recorded: 34 Nutritional Status: BMI > 30  Obese Nutritional Risks: None Diabetes: No  How often do you need to have someone help you when you read instructions, pamphlets, or other written materials from your doctor or pharmacy?: 1 - Never         Activities of Daily Living    10/17/2023    9:00 AM  In your present state of health, do you have any difficulty performing the following activities:  Hearing? 1  Vision? 1  Difficulty concentrating or making decisions? 0  Walking or climbing stairs? 1  Dressing or bathing? 0  Doing errands, shopping? 0  Preparing Food and eating ? N  Using the Toilet? N  In the past six months, have you accidently leaked urine? N  Do you have problems  with loss of bowel control? N  Managing your Medications? Y  Managing your Finances? Y  Housekeeping or managing your Housekeeping? Y    Patient Care Team: Sharon Seller, NP as PCP - General (Geriatric Medicine) Helane Gunther, DPM as Consulting Physician (Podiatry) Yates Decamp, MD as Consulting Physician (Cardiology) Ernesto Rutherford, MD as Referring Physician (Ophthalmology) Estanislado Emms, MD as Consulting Physician (Nephrology)  Indicate any recent Medical Services you may have received from other than [facility] providers in the past year (date may be approximate).     Assessment:   This is a routine wellness examination for [facility].  Hearing/Vision screen Hearing Screening - Comments:: Patient has difficult with hearing Vision Screening - Comments:: Patient has difficulty with vision but does wear glasses.   Goals Addressed   None    Depression Screen    10/17/2023    9:09 AM 09/12/2023    9:12 AM 01/10/2023    9:09 AM  PHQ 2/9 Scores  PHQ - 2 Score 0 0 0  PHQ- 9 Score 2      Fall Risk    10/17/2023    9:05 AM 09/12/2023    9:12 AM 05/12/2023   10:10 AM 03/25/2023    3:51 PM 01/10/2023    9:09 AM  Fall Risk   Falls in the past year? 0 0 0 0 0  Number falls in past yr: 0 0  0 0 0  Injury with Fall? 0 0 0 0 0  Risk for fall due to : No Fall Risks No Fall Risks No Fall Risks No Fall Risks No Fall Risks  Follow up Falls evaluation completed Falls evaluation completed Falls evaluation completed Falls evaluation completed;Education provided;Falls prevention discussed Falls evaluation completed    MEDICARE RISK AT HOME: Medicare Risk at Home Any stairs in or around the home?: Yes If so, are there any without handrails?: No Home free of loose throw rugs in walkways, pet beds, electrical cords, etc?: Yes Adequate lighting in your home to reduce risk of falls?: Yes Life alert?: No Use of a cane, walker or w/c?: Yes Grab bars in the bathroom?: Yes Shower chair or bench in  shower?: Yes Elevated toilet seat or a handicapped toilet?: No  TIMED UP AND GO:  Was the test performed?  No    Cognitive Function:        10/17/2023    8:53 AM  6CIT Screen  What Year? 0 points  What month? 0 points  What time? 0 points  Count back from 20 0 points  Months in reverse 0 points  Repeat phrase 2 points  Total Score 2 points    Immunizations Immunization History  Administered Date(s) Administered   DTaP 05/15/2023   Influenza-Unspecified 05/15/1992, 05/03/1993, 05/17/1994, 05/19/1995, 05/07/1996, 05/13/1997, 05/08/1998, 05/18/1999, 04/30/2003   Moderna Sars-Covid-2 Vaccination 08/16/2019, 09/06/2019   Pneumococcal Conjugate-13 07/23/2015   Pneumococcal Polysaccharide-23 07/23/2013    TDAP status: Up to date  Flu Vaccine status: Declined, Education has been provided regarding the importance of this vaccine but patient still declined. Advised may receive this vaccine at local pharmacy or Health Dept. Aware to provide a copy of the vaccination record if obtained from local pharmacy or Health Dept. Verbalized acceptance and understanding.  Pneumococcal vaccine status: Up to date  Covid-19 vaccine status: Information provided on how to obtain vaccines.   Qualifies for Shingles Vaccine? Yes   Zostavax completed No   Shingrix Completed?: No.    Education has been provided regarding the importance of this vaccine. Patient has been advised to call insurance company to determine out of pocket expense if they have not yet received this vaccine. Advised may also receive vaccine at local pharmacy or Health Dept. Verbalized acceptance and understanding.  Screening Tests Health Maintenance  Topic Date Due   INFLUENZA VACCINE  10/20/2023 (Originally 02/20/2023)   COVID-19 Vaccine (3 - Moderna risk series) 10/16/2024 (Originally 10/04/2019)   Zoster Vaccines- Shingrix (1 of 2) 10/16/2024 (Originally 02/11/1955)   Medicare Annual Wellness (AWV)  10/16/2024   DTaP/Tdap/Td  (2 - Tdap) 05/14/2033   Pneumonia Vaccine 37+ Years old  Completed   DEXA SCAN  Completed   HPV VACCINES  Aged Out    Health Maintenance  There are no preventive care reminders to display for this patient.   Colorectal cancer screening: No longer required.   Mammogram status: No longer required due to age.  Bone Density status: Completed 01/21/2023. Results reflect: Bone density results: OSTEOPENIA. Repeat every 2 years.  Lung Cancer Screening: (Low Dose CT Chest recommended if Age 39-80 years, 20 pack-year currently smoking OR have quit w/in 15years.) does not qualify.   Lung Cancer Screening Referral: na  Additional Screening:  Hepatitis C Screening: does not qualify  Vision Screening: Recommended annual ophthalmology exams for early detection of glaucoma and other disorders of the eye. Is the patient up to date with their annual eye exam?  Yes  Who is the provider or what is the name of the office in which the patient attends annual eye exams? [facility] If pt is not established with a provider, would they like to be referred to a provider to establish care? No .   Dental Screening: Recommended annual dental exams for proper oral hygiene  Community Resource Referral / Chronic Care Management: CRR required this visit?  No   CCM required this visit?  No     Plan:     I have personally reviewed and noted the following in the patient's chart:   Medical and social history Use of alcohol, tobacco or illicit drugs  Current medications and supplements including opioid prescriptions. Patient is not currently taking opioid prescriptions. Functional ability and status Nutritional status Physical activity Advanced directives List of other physicians Hospitalizations, surgeries, and ER visits in previous 12 months Vitals Screenings to include cognitive, depression, and falls Referrals and appointments  In addition, I have reviewed and discussed with patient certain preventive  protocols, quality metrics, and best practice recommendations. A written personalized care plan for preventive services as well as general preventive health recommendations were provided to patient.     Sharon Seller, NP   10/17/2023

## 2023-10-24 ENCOUNTER — Encounter: Payer: Self-pay | Admitting: Primary Care

## 2023-10-24 ENCOUNTER — Ambulatory Visit: Payer: Medicare Other | Admitting: Primary Care

## 2023-10-24 VITALS — BP 143/84 | HR 73 | Temp 96.9°F | Ht 65.5 in | Wt 208.8 lb

## 2023-10-24 DIAGNOSIS — R0683 Snoring: Secondary | ICD-10-CM

## 2023-10-24 NOTE — Patient Instructions (Signed)
 -SUSPECTED OBSTRUCTIVE SLEEP APNEA (OSA): Obstructive Sleep Apnea (OSA) is a condition where the airway becomes blocked during sleep, causing breathing to stop and start repeatedly. We discussed the risks of untreated OSA and various treatment options. You will undergo a home sleep study for three nights to evaluate the severity of OSA. Depending on the results, we may discuss CPAP therapy, positional therapy, weight management, oral appliances, or surgical options.  -ASTHMA: Asthma is a condition where your airways narrow and swell, producing extra mucus, which can make breathing difficult. Your asthma is well-controlled with your current inhaler.  -HYPERTENSION: Hypertension, or high blood pressure, is a condition where the force of the blood against your artery walls is too high. No specific details were discussed today.  -HYPERLIPIDEMIA: Hyperlipidemia is a condition where there are high levels of fats (lipids) in your blood. No specific details were discussed today.  -CHRONIC KIDNEY DISEASE STAGE 3: Chronic Kidney Disease Stage 3 is a condition where the kidneys are moderately damaged and are not working as well as they should. No specific details were discussed today.  INSTRUCTIONS: You will undergo a home sleep study for three nights to evaluate for Obstructive Sleep Apnea (OSA). We will review the results and discuss treatment options based on the severity of OSA.  Follow-up Please call 2-3 weeks after completing sleep study for results/treatment recommendations       Sleep Apnea  Sleep apnea is a condition that affects your breathing while you are sleeping. Your tongue or soft tissue in your throat may block the flow of air while you sleep. You may have shallow breathing or stop breathing for short periods of time. People with sleep apnea may snore loudly. There are three kinds of sleep apnea: Obstructive sleep apnea. This kind is caused by a blocked or collapsed airway. This is  the most common. Central sleep apnea. This kind happens when the part of the brain that controls breathing does not send the correct signals to the muscles that control breathing. Mixed sleep apnea. This is a combination of obstructive and central sleep apnea. What are the causes? The most common cause of sleep apnea is a collapsed or blocked airway. What increases the risk? Being very overweight. Having family members with sleep apnea. Having a tongue or tonsils that are larger than normal. Having a small airway or jaw problems. Being older. What are the signs or symptoms? Loud snoring. Restless sleep. Trouble staying asleep. Being sleepy or tired during the day. Waking up gasping or choking. Having a headache in the morning. Mood swings. Having a hard time remembering things and concentrating. How is this diagnosed? A medical history. A physical exam. A sleep study. This is also called a polysomnography test. This test is done at a sleep lab or in your home while you are sleeping. How is this treated? Treatment may include: Sleeping on your side. Losing weight if you're overweight. Wearing an oral appliance. This is a mouthpiece that moves your lower jaw forward. Using a positive airway pressure (PAP) device to keep your airways open while you sleep, such as: A continuous positive airway pressure (CPAP) device. This device gives forced air through a mask when you breathe out. This keeps your airways open. A bilevel positive airway pressure (BIPAP) device. This device gives forced air through a mask when you breathe in and when you breathe out to keep your airways open. Having surgery if other treatments do not work. If your sleep apnea is not treated, you  may be at risk for: Heart failure. Heart attack. Stroke. Type 2 diabetes or a problem with your blood sugar called insulin resistance. Follow these instructions at home: Medicines Take your medicines only as told by your  health care provider. Avoid alcohol, medicines to help you relax, and certain pain medicines. These may make sleep apnea worse. General instructions Do not smoke, vape, or use products with nicotine or tobacco in them. If you need help quitting, talk with your provider. If you were given a PAP device to open your airway while you sleep, use it as told by your provider. If you're having surgery, make sure to tell your provider you have sleep apnea. You may need to bring your PAP device with you. Contact a health care provider if: The PAP device that you were given to use during sleep bothers you or does not seem to be working. You do not feel better or you feel worse. Get help right away if: You have trouble breathing. You have chest pain. You have trouble talking. One side of your body feels weak. A part of your face is hanging down. These symptoms may be an emergency. Call 911 right away. Do not wait to see if the symptoms will go away. Do not drive yourself to the hospital. This information is not intended to replace advice given to you by your health care provider. Make sure you discuss any questions you have with your health care provider. Document Revised: 04/10/2023 Document Reviewed: 09/12/2022 Elsevier Patient Education  2024 [employer].

## 2023-10-24 NOTE — Progress Notes (Signed)
 @Patient  ID: Brenda Barry, female    DOB: [birth year], 88 y.o.   MRN: [id number]  Chief Complaint  Patient presents with   Follow-up    Sleep Consult - Snoring     Referring provider: Verma Gobble, NP  HPI: 88 year old female, former smoker quit in 1972. PMH significant for HTN, CAD, hypothyroidism, asthma, CKD, hearty murmur, hyperlipidemia.   10/24/2023 Discussed the use of AI scribe software for clinical note transcription with the patient, who gave verbal consent to proceed.  History of Present Illness   Brenda Barry is an 88 year old female who presents for a sleep consult. She was referred by [facility] for evaluation of potential sleep apnea due to retinal issues.  She is concerned about sleep apnea, initially raised by her ophthalmologist due to retinal issues and potential oxygenation problems. Although not formally diagnosed, she suspects sleep apnea due to symptoms such as snoring and episodes where she feels she stops breathing during sleep. She experiences dreams where she feels suffocated, leading to awakening, but does not wake up short of breath or in a panic. These episodes occur approximately once a month. Her family has noted that she snores.  Her sleep routine includes going to bed between 8 and 9 PM, taking about 30 minutes to fall asleep, and waking up 3 to 4 times a night. She typically starts her day between 7:30 and 8:30 AM. No significant fatigue or daytime sleepiness, although she feels tired.  Her medical history includes high blood pressure, high cholesterol, stage 3 kidney disease, and asthma, which is managed with an inhaler. She has not had a sleep study before. No concern for narcolepsy, cataplexy or sleep walking.   Socially, she quit smoking in 1972, does not consume alcohol, is retired, widowed, and has children. She has experienced weight loss over the past two years.      Allergies  Allergen Reactions   Amlodipine Besylate Other  (See Comments)    Nightmares and nerve pain Nightmares and nerve pain   Atorvastatin Other (See Comments)    nightmare nightmare   Isosorbide Dinitrate Other (See Comments)    Nerve pain, lower back pain, feels out of it Nerve pain, lower back pain, feels out of it   [employer] [Fluticasone Furoate-Vilanterol] Other (See Comments)    Tongue swelling   Rosuvastatin Other (See Comments)    Nightmare.  Hence takes it during day    Immunization History  Administered Date(s) Administered   DTaP 05/15/2023   Influenza-Unspecified 05/15/1992, 05/03/1993, 05/17/1994, 05/19/1995, 05/07/1996, 05/13/1997, 05/08/1998, 05/18/1999, 04/30/2003   Moderna Sars-Covid-2 Vaccination 08/16/2019, 09/06/2019   Pneumococcal Conjugate-13 07/23/2015   Pneumococcal Polysaccharide-23 07/23/2013    Past Medical History:  Diagnosis Date   Anemia    Per [facility] New Patient Packet   Asthma    Per [facility] Patient Packet   Bilateral carotid bruits 02/12/2019   Coronary artery disease    Glaucoma    Per [facility] New Patient Packet   H/O bone density study    Per [facility]   H/O heart artery stent    Per [facility] Packet   H/O mammogram    Per [facility] Packet   Heart murmur    Per [facility] New Patient Packet   Hyperlipidemia    Hypertension    Hypothyroid    Per [facility] Patient Packet   Nerve pain    Per [facility] New Patient Packet  Peripheral artery disease (HCC) 02/12/2019   Prediabetes    Per [facility] New Patient Packet   Shingles    Per [facility] New Patient Packet   Stage 3 chronic kidney disease (HCC)    Per [facility] New Patient Packet    Tobacco History: Social History   Tobacco Use  Smoking Status Former   Current packs/day: 0.00   Average packs/day: 0.3 packs/day for 7.0 years (1.8 ttl pk-yrs)   Types: Cigarettes   Start date: 04/18/1964   Quit date: 04/19/1971   Years since quitting: 52.5  Smokeless Tobacco Never   Counseling given: Not Answered   Outpatient Medications Prior  to Visit  Medication Sig Dispense Refill   albuterol (PROVENTIL HFA;VENTOLIN HFA) 108 (90 Base) MCG/ACT inhaler Inhale 2 puffs into the lungs as needed.     aspirin (ASPIRIN CHILDRENS) 81 MG chewable tablet Chew 1 tablet (81 mg total) by mouth daily.     budesonide-formoterol (BREYNA) 160-4.5 MCG/ACT inhaler USE 2 INHALATIONS ORALLY   TWICE DAILY. 6 g 2   calcium carbonate (OSCAL) 1500 (600 Ca) MG TABS tablet Take 600 mg by mouth daily with breakfast.     Cholecalciferol 50 MCG (2000 UT) CAPS Take 1 capsule by mouth daily.     cyanocobalamin (VITAMIN B12) 1000 MCG tablet Take 1,000 mcg by mouth daily.     ferrous sulfate 325 (65 FE) MG EC tablet Take 1 tablet (325 mg total) by mouth daily with breakfast.     hydrALAZINE (APRESOLINE) 25 MG tablet 25 mg by mouth at 9 am and 2pm, 50 mg at 8 pm 360 tablet 1   inclisiran (LEQVIO) 284 MG/1.5ML SOSY injection Inject 284 mg into the skin every 6 (six) months.     latanoprost (XALATAN) 0.005 % ophthalmic solution Place 1 drop into both eyes at bedtime.     levothyroxine (SYNTHROID) 88 MCG tablet TAKE 1 TABLET(88 MCG) BY MOUTH DAILY BEFORE BREAKFAST 90 tablet 1   olmesartan-hydrochlorothiazide (BENICAR HCT) 40-12.5 MG tablet TAKE 1 TABLET BY MOUTH DAILY 90 tablet 1   No facility-administered medications prior to visit.   Physical Exam  BP (!) 143/84 (BP Location: Right Arm, Patient Position: Sitting, Cuff Size: Large)   Pulse 73   Temp (!) 96.9 F (36.1 C) (Temporal)   Ht 5' 5.5" (1.664 m)   Wt 208 lb 12.8 oz (94.7 kg)   LMP  (LMP Unknown)   SpO2 93%   BMI 34.22 kg/m  Physical Exam Constitutional:      General: She is not in acute distress.    Appearance: Normal appearance. She is not ill-appearing.  HENT:     Head: Normocephalic and atraumatic.  Cardiovascular:     Rate and Rhythm: Normal rate and regular rhythm.     Heart sounds: Murmur heard.  Pulmonary:     Effort: Pulmonary effort is normal.     Breath sounds: Normal breath sounds.   Musculoskeletal:        General: Normal range of motion.  Skin:    General: Skin is warm and dry.  Neurological:     General: No focal deficit present.     Mental Status: She is alert and oriented to person, place, and time. Mental status is at baseline.  Psychiatric:        Mood and Affect: Mood normal.        Behavior: Behavior normal.        Thought Content: Thought content normal.  Judgment: Judgment normal.      Lab Results:  CBC    Component Value Date/Time   WBC 5.3 09/22/2023 1043   WBC 5.0 09/12/2023 0954   RBC 4.36 09/22/2023 1043   HGB 12.8 09/22/2023 1043   HGB 11.6 07/31/2020 0925   HCT 39.9 09/22/2023 1043   HCT 36.6 07/31/2020 0925   PLT 276 09/22/2023 1043   PLT 307 07/31/2020 0925   MCV 91.5 09/22/2023 1043   MCV 89 07/31/2020 0925   MCH 29.4 09/22/2023 1043   MCHC 32.1 09/22/2023 1043   RDW 13.6 09/22/2023 1043   RDW 13.8 07/31/2020 0925   LYMPHSABS 2.6 09/22/2023 1043   MONOABS 0.3 09/22/2023 1043   EOSABS 0.3 09/22/2023 1043   BASOSABS 0.1 09/22/2023 1043    BMET    Component Value Date/Time   NA 139 09/22/2023 1043   NA 141 07/31/2020 0917   K 3.8 09/22/2023 1043   CL 104 09/22/2023 1043   CO2 30 09/22/2023 1043   GLUCOSE 87 09/22/2023 1043   BUN 17 09/22/2023 1043   BUN 16 07/31/2020 0917   CREATININE 1.29 (H) 09/22/2023 1043   CREATININE 1.52 (H) 09/12/2023 0954   CALCIUM 10.1 09/22/2023 1043   GFRNONAA 41 (L) 09/22/2023 1043   GFRAA 46 (L) 07/31/2020 0917   GFRAA 48 (L) 03/22/2020 0907    BNP No results found for: "BNP"  ProBNP No results found for: "PROBNP"  Imaging: No results found.   Assessment & Plan:   No problem-specific Assessment & Plan notes found for this encounter.  1. Loud snoring (Primary) - Home sleep test; Future  Assessment and Plan    Suspected Obstructive Sleep Apnea (OSA) Suspected OSA due to snoring, apneic events, and potential nocturnal hypoxemia. Discussed risks of untreated OSA and  treatment options. She expressed concerns about CPAP use. - Order home sleep study for three nights. - Discuss CPAP therapy if OSA is moderate or severe. - Educate on positional therapy and weight management. - Discuss oral appliance and surgical options if CPAP is not tolerated.  Asthma Well-controlled with inhaler (budesonide-formoterol).  Follow-up Will undergo home sleep study for OSA evaluation. - Review home sleep study results and discuss treatment options based on OSA severity.        Antonio Baumgarten, NP 10/24/2023

## 2023-11-01 ENCOUNTER — Encounter

## 2023-11-01 DIAGNOSIS — R0683 Snoring: Secondary | ICD-10-CM

## 2023-11-05 ENCOUNTER — Ambulatory Visit: Payer: Medicare Other

## 2023-11-05 VITALS — BP 196/123 | HR 78 | Temp 98.1°F | Resp 20 | Ht 65.5 in | Wt 208.8 lb

## 2023-11-05 DIAGNOSIS — E782 Mixed hyperlipidemia: Secondary | ICD-10-CM

## 2023-11-05 MED ORDER — INCLISIRAN SODIUM 284 MG/1.5ML ~~LOC~~ SOSY
284.0000 mg | PREFILLED_SYRINGE | Freq: Once | SUBCUTANEOUS | Status: AC
  Administered 2023-11-05: 284 mg via SUBCUTANEOUS
  Filled 2023-11-05: qty 1.5

## 2023-11-05 NOTE — Progress Notes (Signed)
 Diagnosis: Hyperlipidemia  Provider:  Mannam, Praveen MD  Procedure: Injection  Leqvio (inclisiran), Dose: 284 mg, Site: subcutaneous, Number of injections: 1  Injection Site(s): right lower quad. abdomen  Post Care:  right lower quad abdominal injection  Discharge: Condition: Good, Destination: Home . AVS Provided  Performed by:  Lauran Pollard, LPN

## 2023-11-21 DIAGNOSIS — G4733 Obstructive sleep apnea (adult) (pediatric): Secondary | ICD-10-CM | POA: Diagnosis not present

## 2023-12-17 ENCOUNTER — Encounter: Payer: Self-pay | Admitting: Podiatry

## 2023-12-17 ENCOUNTER — Ambulatory Visit (INDEPENDENT_AMBULATORY_CARE_PROVIDER_SITE_OTHER): Payer: Medicare Other | Admitting: Podiatry

## 2023-12-17 DIAGNOSIS — B351 Tinea unguium: Secondary | ICD-10-CM

## 2023-12-17 DIAGNOSIS — D689 Coagulation defect, unspecified: Secondary | ICD-10-CM | POA: Diagnosis not present

## 2023-12-17 DIAGNOSIS — M79674 Pain in right toe(s): Secondary | ICD-10-CM | POA: Diagnosis not present

## 2023-12-17 DIAGNOSIS — M79675 Pain in left toe(s): Secondary | ICD-10-CM | POA: Diagnosis not present

## 2023-12-17 NOTE — Progress Notes (Signed)
 This patient returns to my office for at risk foot care.  This patient requires this care by a professional since this patient will be at risk due to having chronic kidney disease and PAD.Marland Kitchen  This patient is unable to cut nails herself since the patient cannot reach her nails.These nails are painful walking and wearing shoes.  This patient presents for at risk foot care today.    General Appearance  Alert, conversant and in no acute stress.  Vascular  Dorsalis pedis and posterior tibial  pulses are  weakly palpable  bilaterally.  Capillary return is within normal limits  bilaterally. Temperature is within normal limits  bilaterally.  Neurologic  Senn-Weinstein monofilament wire test within normal limits  bilaterally. Muscle power within normal limits bilaterally.  Nails Thick disfigured discolored nails with subungual debris  from hallux to fifth toes bilaterally. No evidence of bacterial infection or drainage bilaterally.  Orthopedic  No limitations of motion  feet .  No crepitus or effusions noted.  No bony pathology or digital deformities noted.  HAV  B/L.  Skin  normotropic skin with no porokeratosis noted bilaterally.  No signs of infections or ulcers noted.   Asymptomatic HD 5th B/L.  Onychomycosis  Pain in right toes  Pain in left toes  Consent was obtained for treatment procedures.   Mechanical debridement of nails 1-5  bilaterally performed with a nail nipper.  Filed with dremel without incident.    Return office visit   3 months                   Told patient to return for periodic foot care and evaluation due to potential at risk complications.   Helane Gunther DPM

## 2024-01-02 ENCOUNTER — Other Ambulatory Visit: Payer: Self-pay | Admitting: Nurse Practitioner

## 2024-01-02 MED ORDER — ALBUTEROL SULFATE HFA 108 (90 BASE) MCG/ACT IN AERS: 2.0000 | INHALATION_SPRAY | RESPIRATORY_TRACT | 1 refills | Status: AC | PRN

## 2024-01-02 NOTE — Telephone Encounter (Signed)
 Copied from CRM [id number]. Topic: Clinical - Medication Refill >> Jan 02, 2024 12:22 PM Adrianna P wrote: Medication: albuterol (PROVENTIL HFA;VENTOLIN HFA) 108 (90 Base) MCG/ACT inhaler   Has the patient contacted their pharmacy? No (Agent: If no, request that the patient contact the pharmacy for the refill. If patient does not wish to contact the pharmacy document the reason why and proceed with request.) (Agent: If yes, when and what did the pharmacy advise?)  This is the patient's preferred pharmacy:  [facility] [place], [place] - [address] AT Portal to Registered [facility] [address] [place] [address] Phone: [phone] Fax: [phone]  [id number] [place], [place] - [address] AT [address] [place] [address] Phone: [phone] Fax: [phone]  Is this the correct pharmacy for this prescription? Yes If no, delete pharmacy and type the correct one.   Has the prescription been filled recently? No  Is the patient out of the medication? Yes  Has the patient been seen for an appointment in the last year OR does the patient have an upcoming appointment? Yes  Can we respond through MyChart? Yes  Agent: Please be advised that Rx refills may take up to 3 business days. We ask that you follow-up with your pharmacy.

## 2024-01-02 NOTE — Addendum Note (Signed)
 Addended by: Rayelle Armor K on: 01/02/2024 02:58 PM   Modules accepted: Orders

## 2024-01-02 NOTE — Telephone Encounter (Signed)
 Patient hasn't had medication refilled since 2019. Medication pend and sent to PCP Roselie Conger Jessica K, NP for approval.

## 2024-01-07 ENCOUNTER — Other Ambulatory Visit: Payer: Self-pay | Admitting: Nurse Practitioner

## 2024-01-07 MED ORDER — LEVOTHYROXINE SODIUM 88 MCG PO TABS: 88.0000 ug | ORAL_TABLET | Freq: Every day | ORAL | 1 refills | Status: DC

## 2024-01-07 NOTE — Telephone Encounter (Signed)
 Copied from CRM [id number]. Topic: Clinical - Medication Refill >> Jan 07, 2024  3:11 PM Ary Bitter R wrote: Medication: levothyroxine  (SYNTHROID ) 88 MCG tablet  Has the patient contacted their pharmacy? No, but she is switching pharmacies and needs a new rx.  This is the patient's preferred pharmacy:  [facility] - [place], [place] - [address] AT Portal to Registered [address] Sites [address] [place] [address] Phone: [phone] Fax: [phone]  Is this the correct pharmacy for this prescription? Yes If no, delete pharmacy and type the correct one.   Has the prescription been filled recently? Yes  Is the patient out of the medication? No  Has the patient been seen for an appointment in the last year OR does the patient have an upcoming appointment? Yes  Can we respond through MyChart? Yes  Agent: Please be advised that Rx refills may take up to 3 business days. We ask that you follow-up with your pharmacy.

## 2024-01-08 ENCOUNTER — Ambulatory Visit: Payer: Federal, State, Local not specified - PPO | Admitting: Cardiology

## 2024-01-13 ENCOUNTER — Telehealth

## 2024-01-13 ENCOUNTER — Other Ambulatory Visit: Payer: Self-pay | Admitting: Nurse Practitioner

## 2024-01-13 NOTE — Telephone Encounter (Signed)
 Spoke with pt regarding a refill being requested for Synthroid  , the last medication was ordered on 01/07/2024 , I was calling her in regards of seeing if she had received her medication before refilling her request. Spoke with patient and she stated that the medication has been mailed out to her.    Copied from CRM [id number]. Topic: Clinical - Prescription Issue >> Jan 13, 2024  9:43 AM Rosaria A wrote: Reason for CRM: Patient states that she has been speaking with [place] from the office and she was asking the patient if her medication was [facility]. Patient states that she checked and they are mailing the medication to her but she has not gotten it yet.  levothyroxine  (SYNTHROID ) 88 MCG tablet

## 2024-02-03 ENCOUNTER — Encounter: Payer: Self-pay | Admitting: Primary Care

## 2024-02-03 ENCOUNTER — Ambulatory Visit (INDEPENDENT_AMBULATORY_CARE_PROVIDER_SITE_OTHER): Admitting: Primary Care

## 2024-02-03 VITALS — BP 138/70 | HR 76 | Temp 97.8°F | Ht 65.5 in | Wt 210.4 lb

## 2024-02-03 DIAGNOSIS — G4730 Sleep apnea, unspecified: Secondary | ICD-10-CM | POA: Diagnosis not present

## 2024-02-03 DIAGNOSIS — Z87891 Personal history of nicotine dependence: Secondary | ICD-10-CM

## 2024-02-03 NOTE — Progress Notes (Signed)
 @Patient  ID: Brenda Barry, female    DOB: October 01, 1935, 88 y.o.   MRN: [id number]  Chief Complaint  Patient presents with   Follow-up    asthma    Referring provider: [name], NP  HPI: 88 year old female, former smoker quit in 1972. PMH significant for HTN, CAD, hypothyroidism, asthma, CKD, hearty murmur, hyperlipidemia.   Previous [facility] pulmonary encounter: 10/24/2023 Discussed the use of AI scribe software for clinical note transcription with the patient, who gave verbal consent to proceed.  History of Present Illness   Brenda Barry is an 88 year old female who presents for a sleep consult. She was referred by Harlene Brenda for evaluation of potential sleep apnea due to retinal issues.  She is concerned about sleep apnea, initially raised by her ophthalmologist due to retinal issues and potential oxygenation problems. Although not formally diagnosed, she suspects sleep apnea due to symptoms such as snoring and episodes where she feels she stops breathing during sleep. She experiences dreams where she feels suffocated, leading to awakening, but does not wake up short of breath or in a panic. These episodes occur approximately once a month. Her family has noted that she snores.  Her sleep routine includes going to bed between 8 and 9 PM, taking about 30 minutes to fall asleep, and waking up 3 to 4 times a night. She typically starts her day between 7:30 and 8:30 AM. No significant fatigue or daytime sleepiness, although she feels tired.  Her medical history includes high blood pressure, high cholesterol, stage 3 kidney disease, and asthma, which is managed with an inhaler. She has not had a sleep study before. No concern for narcolepsy, cataplexy or sleep walking.   Socially, she quit smoking in 1972, does not consume alcohol, is retired, widowed, and has children. She has experienced weight loss over the past two years.      02/03/2024- Interim hx  Brenda Barry is  a 88 year old female who presents for a follow-up regarding her sleep study results. She was referred by Harlene Brenda for potential sleep apnea due to retinal issues.  She experiences symptoms of snoring, feeling like she stops breathing during sleep, and having dreams where she feels suffocated, leading to awakening. These symptoms occur about once a month. Her family has noted her snoring. She goes to bed between 8 and 9 PM, takes 30 minutes to fall asleep, and wakes up 3 to 4 times a night. She starts her day between 7:30 and 8:30 AM. No significant daytime sleepiness, but she feels tired during the day.  A home sleep study in April 2025 showed 8.5 pauses per hour. Her oxygen levels dropped as low as 81%, and she spent about 13 minutes or 6% of her sleep time with low oxygen levels.  Her past medical history includes high cholesterol, high blood pressure, kidney disease, and asthma. She quit smoking in 1972.   Allergies  Allergen Reactions   Amlodipine Besylate Other (See Comments)    Nightmares and nerve pain Nightmares and nerve pain   Atorvastatin Other (See Comments)    nightmare nightmare   Isosorbide  Dinitrate Other (See Comments)    Nerve pain, lower back pain, feels out of it Nerve pain, lower back pain, feels out of it   Breo Ellipta  [Fluticasone  Furoate-Vilanterol] Other (See Comments)    Tongue swelling   Rosuvastatin Other (See Comments)    Nightmare.  Hence takes it during day    Immunization  History  Administered Date(s) Administered   DTaP 05/15/2023   Influenza-Unspecified 05/15/1992, 05/03/1993, 05/17/1994, 05/19/1995, 05/07/1996, 05/13/1997, 05/08/1998, 05/18/1999, 04/30/2003   Moderna Sars-Covid-2 Vaccination 08/16/2019, 09/06/2019   Pneumococcal Conjugate-13 07/23/2015   Pneumococcal Polysaccharide-23 07/23/2013    Past Medical History:  Diagnosis Date   Anemia    Per [facility] New Patient Packet   Asthma    Per [facility] New Patient Packet   Bilateral carotid  bruits 02/12/2019   Coronary artery disease    Glaucoma    Per [facility] New Patient Packet   H/O bone density study    Per [facility] New Patient Packet   H/O heart artery stent    Per [facility] New Patient Packet   H/O mammogram    Per [facility] New Patient Packet   Heart murmur    Per [facility] New Patient Packet   Hyperlipidemia    Hypertension    Hypothyroid    Per [facility] New Patient Packet   Nerve pain    Per [facility] New Patient Packet   Peripheral artery disease (HCC) 02/12/2019   Prediabetes    Per [facility] New Patient Packet   Shingles    Per [facility] New Patient Packet   Stage 3 chronic kidney disease (HCC)    Per [facility] New Patient Packet    Tobacco History: Social History   Tobacco Use  Smoking Status Former   Current packs/day: 0.00   Average packs/day: 0.3 packs/day for 7.0 years (1.8 ttl pk-yrs)   Types: Cigarettes   Start date: 04/18/1964   Quit date: 04/19/1971   Years since quitting: 52.8  Smokeless Tobacco Never   Counseling given: Not Answered   Outpatient Medications Prior to Visit  Medication Sig Dispense Refill   albuterol  (VENTOLIN  HFA) 108 (90 Base) MCG/ACT inhaler Inhale 2 puffs into the lungs as needed. 6.7 g 1   aspirin  (ASPIRIN  CHILDRENS) 81 MG chewable tablet Chew 1 tablet (81 mg total) by mouth daily.     budesonide -formoterol  (BREYNA ) 160-4.5 MCG/ACT inhaler USE 2 INHALATIONS ORALLY   TWICE DAILY. 6 g 2   calcium carbonate (OSCAL) 1500 (600 Ca) MG TABS tablet Take 600 mg by mouth daily with breakfast.     Cholecalciferol 50 MCG (2000 UT) CAPS Take 1 capsule by mouth daily.     cyanocobalamin  (VITAMIN B12) 1000 MCG tablet Take 1,000 mcg by mouth daily.     ferrous sulfate  325 (65 FE) MG EC tablet Take 1 tablet (325 mg total) by mouth daily with breakfast.     hydrALAZINE  (APRESOLINE ) 25 MG tablet 25 mg by mouth at 9 am and 2pm, 50 mg at 8 pm 360 tablet 1   inclisiran (LEQVIO ) 284 MG/1.5ML SOSY injection Inject 284 mg into the skin every 6 (six) months.     latanoprost (XALATAN) 0.005  % ophthalmic solution Place 1 drop into both eyes at bedtime.     levothyroxine  (SYNTHROID ) 88 MCG tablet Take 1 tablet (88 mcg total) by mouth daily before breakfast. 90 tablet 1   olmesartan -hydrochlorothiazide (BENICAR  HCT) 40-12.5 MG tablet TAKE 1 TABLET BY MOUTH DAILY 90 tablet 1   No facility-administered medications prior to visit.    Review of Systems  Review of Systems  Constitutional: Negative.   HENT: Negative.    Respiratory: Negative.    Cardiovascular: Negative.   Psychiatric/Behavioral:  Positive for sleep disturbance.     Physical Exam  BP 138/70 (BP Location: Left Arm, Patient Position: Sitting, Cuff Size: Normal)   Pulse 76   Temp  97.8 F (36.6 C) (Temporal)   Ht 5' 5.5 (1.664 m)   Wt 210 lb 6.4 oz (95.4 kg)   LMP  (LMP Unknown)   SpO2 98%   BMI 34.48 kg/m  Physical Exam Constitutional:      Appearance: Normal appearance.  HENT:     Head: Normocephalic and atraumatic.  Cardiovascular:     Rate and Rhythm: Normal rate and regular rhythm.  Pulmonary:     Effort: Pulmonary effort is normal.     Breath sounds: Normal breath sounds.  Musculoskeletal:        General: Normal range of motion.  Skin:    General: Skin is warm and dry.  Neurological:     General: No focal deficit present.     Mental Status: She is alert and oriented to person, place, and time. Mental status is at baseline.  Psychiatric:        Mood and Affect: Mood normal.        Behavior: Behavior normal.        Thought Content: Thought content normal.        Judgment: Judgment normal.      Lab Results:  CBC    Component Value Date/Time   WBC 5.3 09/22/2023 1043   WBC 5.0 09/12/2023 0954   RBC 4.36 09/22/2023 1043   HGB 12.8 09/22/2023 1043   HGB 11.6 07/31/2020 0925   HCT 39.9 09/22/2023 1043   HCT 36.6 07/31/2020 0925   PLT 276 09/22/2023 1043   PLT 307 07/31/2020 0925   MCV 91.5 09/22/2023 1043   MCV 89 07/31/2020 0925   MCH 29.4 09/22/2023 1043   MCHC 32.1 09/22/2023  1043   RDW 13.6 09/22/2023 1043   RDW 13.8 07/31/2020 0925   LYMPHSABS 2.6 09/22/2023 1043   MONOABS 0.3 09/22/2023 1043   EOSABS 0.3 09/22/2023 1043   BASOSABS 0.1 09/22/2023 1043    BMET    Component Value Date/Time   NA 139 09/22/2023 1043   NA 141 07/31/2020 0917   K 3.8 09/22/2023 1043   CL 104 09/22/2023 1043   CO2 30 09/22/2023 1043   GLUCOSE 87 09/22/2023 1043   BUN 17 09/22/2023 1043   BUN 16 07/31/2020 0917   CREATININE 1.29 (H) 09/22/2023 1043   CREATININE 1.52 (H) 09/12/2023 0954   CALCIUM 10.1 09/22/2023 1043   GFRNONAA 41 (L) 09/22/2023 1043   GFRAA 46 (L) 07/31/2020 0917   GFRAA 48 (L) 03/22/2020 0907    BNP No results found for: BNP  ProBNP No results found for: PROBNP  Imaging: No results found.   Assessment & Plan:   1. Mild sleep apnea (Primary) - AMB REFERRAL FOR DME  Mild Obstructive Sleep Apnea Mild obstructive sleep apnea with 8.5 apneic events per hour and oxygen desaturation to 81% for 13 minutes (6% of sleep time). Symptoms include snoring, waking up due to suffocation dreams, and daytime fatigue. Retinal changes noted, possibly related to oxygen desaturation. - Order auto CPAP 5-15cm h20 with nasal cradle mask - Instruct on CPAP maintenance and educated on CPAP mask options and usage - Schedule follow-up in 6-8 weeks for compliance check  Almarie LELON Ferrari, NP 02/03/2024

## 2024-02-03 NOTE — Patient Instructions (Addendum)
 Today, we reviewed the results of your sleep study and discussed your symptoms of snoring, feeling like you stop breathing during sleep, and waking up due to suffocation dreams. We also reviewed your past medical history, including high cholesterol, high blood pressure, kidney disease, and asthma.  YOUR PLAN:  -MILD OBSTRUCTIVE SLEEP APNEA: Mild obstructive sleep apnea is a condition where your breathing stops and starts during sleep, leading to low oxygen levels. Your sleep study showed 8.5 pauses per hour and oxygen levels dropping to 81%. We will start you on a CPAP machine with a nasal cradle mask to help keep your airway open during sleep. Please use distilled water in the CPAP machine and follow the maintenance instructions. We will check your progress in 6-8 weeks. If you have any issues with the mask, contact the medical supply store.  INSTRUCTIONS: Please schedule a follow-up appointment in 6-8 weeks to check your compliance with the CPAP machine. If you experience any issues with the CPAP mask, contact the medical supply store for assistance.  Orders: Auto CPAP 5-15cm h20 with mask of choice   Follow-up 8 weeks with Beth NP for CPAP compliance    CPAP and BIPAP Information CPAP and BIPAP use air pressure to keep your airways open and help you breathe well. CPAP and BIPAP use different amounts of pressure. Your health care provider will tell you whether CPAP or BIPAP would be best for you. CPAP stands for continuous positive airway pressure. With CPAP, the amount of pressure stays the same while you breathe in and out. BIPAP stands for bi-level positive airway pressure. With BIPAP, the amount of pressure will be higher when you breathe in and lower when you breathe out. This allows you to take bigger breaths. CPAP or BIPAP may be used in the hospital or at home. You may need to have a sleep study before your provider can order a device for you to use at home. What are the  advantages? CPAP and BIPAP are most often used for obstructive sleep apnea to keep the airways from collapsing when the muscles relax during sleep. CPAP or BIPAP can be used if you have: Chronic obstructive pulmonary disease. Heart failure. Medical conditions that cause muscle weakness. Other problems that cause breathing to be shallow, weak, or difficult. What are the risks? Your provider will talk with you about risks. These may include: Sores on your nose or face caused from the mask, prongs, or nasal pillows. Dry or stuffy nose or nosebleeds. Feeling gassy or bloated. Sinus or lung infection if the equipment is not cleaned well. When should CPAP or BIPAP be used? In most cases, CPAP or BIPAP is used during sleep at night or whenever the main sleep time happens. It's also used during naps. People with some medical conditions may need to wear the mask when they're awake. Follow instructions from your provider about when to use your CPAP or BIPAP. What happens during CPAP or BIPAP?  Both CPAP and BIPAP use a small machine that uses electricity to create air pressure. A long tube connects the device to a plastic mask. Air is blown through the mask into your nose or mouth. The amount of pressure that's used to blow the air can be adjusted. Your provider will set the pressure setting and help you find the best mask for you. Tips for using the mask There are different types and sizes of masks. If your mask does not fit well, talk with your provider about getting a  different one. Some common types of masks include: Full face masks, which fit over the mouth and nose. Nasal masks, which fit over the nose. Nasal pillow or prong masks, which fit into the nostrils. The mask needs to be snug to your face, so some people feel trapped or closed in at first. If you feel this way, you may need to get used to the mask. Hold the mask loosely over your nose or mouth and then gradually put the the mask on more  snugly. Slowly increase the amount of time you use the mask. If you have trouble with your mask not fitting well or leaking, talk with your provider. Do not stop using the mask. Tips for using the device Follow instructions from your provider about how to and how often to use the device. For home use, CPAP and BIPAP devices come from home health care companies. There are many different brands. Your health insurance company will help to decide which device you get. Keep the CPAP or BIPAP device and attachments clean. Ask your home health care company or check the instruction book for cleaning instructions. Make sure the humidifier is filled with germ-free (sterile) water and is working correctly. This will help prevent a dry or stuffy nose or nosebleeds. A nasal saline mist or spray may keep your nose from getting dry and sore. Do not eat or drink while the CPAP or BIPAP device is on. Food or drinks could get pushed into your lungs by the pressure of the CPAP or BIPAP. Follow these instructions at home: Take over-the-counter and prescription medicines only as told by your provider. Do not smoke, vape, or use nicotine or tobacco. Contact a health care provider if: You have redness or pressure sores on your head, face, mouth, or nose from the mask or headgear. You have trouble using the CPAP or BIPAP device. You have trouble going to sleep or staying asleep. Someone tells you that you snore even when wearing your CPAP or BIPAP device. Get help right away if: You have trouble breathing. You feel confused. These symptoms may be an emergency. Get help right away. Call 911. Do not wait to see if the symptoms will go away. Do not drive yourself to the hospital. This information is not intended to replace advice given to you by your health care provider. Make sure you discuss any questions you have with your health care provider. Document Revised: 10/30/2022 Document Reviewed: 10/30/2022 Elsevier  Patient Education  2024 [employer].

## 2024-02-25 ENCOUNTER — Telehealth: Payer: Self-pay | Admitting: *Deleted

## 2024-02-25 NOTE — Telephone Encounter (Signed)
 Please advise with suggestions for issue with new cpap.

## 2024-02-25 NOTE — Telephone Encounter (Signed)
 What does that mean? Is he having issues with a new CPAP or is he having trouble getting a new machine?  This should go to the clinical pool not me, I put an order in for new machine in July

## 2024-02-26 NOTE — Telephone Encounter (Signed)
 We can lower humidity settings and pressure if she would like to try that before sending it back. Otherwise her sleep apnea is mild and if she can not tolerate we can discontinue.

## 2024-02-26 NOTE — Telephone Encounter (Signed)
 Called and spoke with the patient, pt states she was using nasal mask and could not get comfortable using it. She then contacted DME company regarding issues and she then got sent a full face mask. After using new mask pt states breathing got better but she felt like she was suffocating and it was to humid. Pt got a call from [employer] to see how CPAP was going and after pt explained to dme what was going on, she came to the conclusion that she is sending CPAP machine back and would not like to continue using.

## 2024-02-26 NOTE — Telephone Encounter (Signed)
 I called and spoke with the pt and notified of response from [facility]  She does not wish to change settings  She has already called [employer] and they will be mailing her a box to send her machine back  She states that it was making her mentally ill and wanted to completely d/c asap  Cancelled her upcoming appt to discuss compliance  She will f/u with PCP and call here as needed

## 2024-03-12 ENCOUNTER — Encounter: Payer: Self-pay | Admitting: Nurse Practitioner

## 2024-03-12 ENCOUNTER — Ambulatory Visit: Payer: Medicare Other | Admitting: Nurse Practitioner

## 2024-03-12 VITALS — BP 150/80 | HR 60 | Temp 97.5°F | Ht 65.5 in | Wt 212.2 lb

## 2024-03-12 DIAGNOSIS — D649 Anemia, unspecified: Secondary | ICD-10-CM

## 2024-03-12 DIAGNOSIS — J4540 Moderate persistent asthma, uncomplicated: Secondary | ICD-10-CM | POA: Diagnosis not present

## 2024-03-12 DIAGNOSIS — E782 Mixed hyperlipidemia: Secondary | ICD-10-CM | POA: Diagnosis not present

## 2024-03-12 DIAGNOSIS — M8580 Other specified disorders of bone density and structure, unspecified site: Secondary | ICD-10-CM

## 2024-03-12 DIAGNOSIS — I10 Essential (primary) hypertension: Secondary | ICD-10-CM

## 2024-03-12 DIAGNOSIS — E039 Hypothyroidism, unspecified: Secondary | ICD-10-CM

## 2024-03-12 DIAGNOSIS — G4733 Obstructive sleep apnea (adult) (pediatric): Secondary | ICD-10-CM

## 2024-03-12 DIAGNOSIS — N183 Chronic kidney disease, stage 3 unspecified: Secondary | ICD-10-CM

## 2024-03-12 DIAGNOSIS — I2510 Atherosclerotic heart disease of native coronary artery without angina pectoris: Secondary | ICD-10-CM

## 2024-03-12 LAB — LIPID PANEL
Cholesterol: 234 mg/dL — ABNORMAL HIGH (ref <200)
HDL: 79 mg/dL (ref >=50)
LDL Cholesterol (Calc): 139 mg/dL — ABNORMAL HIGH
Non-HDL Cholesterol (Calc): 155 mg/dL — ABNORMAL HIGH (ref <130)
Total CHOL/HDL Ratio: 3 (calc) (ref <5.0)
Triglycerides: 68 mg/dL (ref <150)

## 2024-03-12 LAB — COMPREHENSIVE METABOLIC PANEL WITH GFR
AG Ratio: 1.5 (calc) (ref 1.0–2.5)
ALT: 14 U/L (ref 6–29)
AST: 20 U/L (ref 10–35)
Albumin: 4.2 g/dL (ref 3.6–5.1)
Alkaline phosphatase (APISO): 60 U/L (ref 37–153)
BUN/Creatinine Ratio: 14 (calc) (ref 6–22)
BUN: 19 mg/dL (ref 7–25)
CO2: 28 mmol/L (ref 20–32)
Calcium: 10 mg/dL (ref 8.6–10.4)
Chloride: 105 mmol/L (ref 98–110)
Creat: 1.37 mg/dL — ABNORMAL HIGH (ref 0.60–0.95)
Globulin: 2.8 g/dL (ref 1.9–3.7)
Glucose, Bld: 86 mg/dL (ref 65–99)
Potassium: 4.5 mmol/L (ref 3.5–5.3)
Sodium: 141 mmol/L (ref 135–146)
Total Bilirubin: 0.3 mg/dL (ref 0.2–1.2)
Total Protein: 7 g/dL (ref 6.1–8.1)
eGFR: 38 mL/min/1.73m2 — ABNORMAL LOW (ref >=60)

## 2024-03-12 LAB — CBC WITH DIFFERENTIAL/PLATELET
Absolute Lymphocytes: 2616 {cells}/uL (ref 850–3900)
Absolute Monocytes: 496 {cells}/uL (ref 200–950)
Basophils Absolute: 40 {cells}/uL (ref 0–200)
Basophils Relative: 0.7 %
Eosinophils Absolute: 239 {cells}/uL (ref 15–500)
Eosinophils Relative: 4.2 %
HCT: 37.1 % (ref 35.0–45.0)
Hemoglobin: 11.5 g/dL — ABNORMAL LOW (ref 11.7–15.5)
MCH: 28.9 pg (ref 27.0–33.0)
MCHC: 31 g/dL — ABNORMAL LOW (ref 32.0–36.0)
MCV: 93.2 fL (ref 80.0–100.0)
MPV: 9.7 fL (ref 7.5–12.5)
Monocytes Relative: 8.7 %
Neutro Abs: 2309 {cells}/uL (ref 1500–7800)
Neutrophils Relative %: 40.5 %
Platelets: 279 Thousand/uL (ref 140–400)
RBC: 3.98 Million/uL (ref 3.80–5.10)
RDW: 13.6 % (ref 11.0–15.0)
Total Lymphocyte: 45.9 %
WBC: 5.7 Thousand/uL (ref 3.8–10.8)

## 2024-03-12 MED ORDER — BUDESONIDE-FORMOTEROL FUMARATE 160-4.5 MCG/ACT IN AERO: 2.0000 | INHALATION_SPRAY | Freq: Two times a day (BID) | RESPIRATORY_TRACT | 5 refills | Status: AC

## 2024-03-12 MED ORDER — HYDRALAZINE HCL 25 MG PO TABS: ORAL_TABLET | ORAL | 1 refills | Status: DC

## 2024-03-12 NOTE — Assessment & Plan Note (Signed)
 Stable, continues follow up with cardiologist, continues on asa

## 2024-03-12 NOTE — Assessment & Plan Note (Signed)
 TSH at goal on last labs, continues on levothyroxine  88 mcg daily

## 2024-03-12 NOTE — Patient Instructions (Signed)
 To check blood pressure 1 hour AFTER you have had your medication Make sure you have been sitting at least 5 mins  Record and let us  know- BRING to next appt in 4 weeks.  Goal <140/90

## 2024-03-12 NOTE — Assessment & Plan Note (Signed)
 Could not tolerate CPAP due to claustrophobia, has tried 2 different mask.

## 2024-03-12 NOTE — Assessment & Plan Note (Signed)
 On leqvio  per cardiology Follow up lipids

## 2024-03-12 NOTE — Assessment & Plan Note (Signed)
Recommended to take calcium 600 mg twice daily with Vitamin D 2000 units daily and weight bearing activity 30 mins/5 days a week   

## 2024-03-12 NOTE — Assessment & Plan Note (Signed)
 Chronic and stable Encourage proper hydration Follow metabolic panel Avoid nephrotoxic meds (NSAIDS)

## 2024-03-12 NOTE — Assessment & Plan Note (Signed)
-  Blood pressure elevated today but typically well controlled Has had changes in diet with higher sodium encouraged to decrease sodium intake.  -she will work on dietary modfications  -will have pt continue to monitor home bp goal <140/90, to notify if readings remain high on 3 different days  -will have her follow up in 4 weeks with bp readings -follow metabolic panel

## 2024-03-12 NOTE — Assessment & Plan Note (Signed)
 Controlled, no current regimen, no recent exacerbations.

## 2024-03-12 NOTE — Assessment & Plan Note (Signed)
 Stable on recent labs, continues on iron  supplement Follow cbc

## 2024-03-12 NOTE — Progress Notes (Signed)
 Careteam: Patient Care Team: [name], NP as PCP - General (Geriatric Medicine) Loreda Hacker, DPM as Consulting Physician (Podiatry) Ladona Heinz, MD as Consulting Physician (Cardiology) Octavia Charleston, MD as Referring Physician (Ophthalmology) Jerrye Katheryn BROCKS, MD as Consulting Physician (Nephrology)  PLACE OF SERVICE:  [facility]  Advanced Directive information    Allergies  Allergen Reactions   Amlodipine Besylate Other (See Comments)    Nightmares and nerve pain Nightmares and nerve pain   Atorvastatin Other (See Comments)    nightmare nightmare   Isosorbide  Dinitrate Other (See Comments)    Nerve pain, lower back pain, feels out of it Nerve pain, lower back pain, feels out of it   Breo Ellipta  [Fluticasone  Furoate-Vilanterol] Other (See Comments)    Tongue swelling   Rosuvastatin Other (See Comments)    Nightmare.  Hence takes it during day    Chief Complaint  Patient presents with   Follow-up    6 month follow up    HPI:  Discussed the use of AI scribe software for clinical note transcription with the patient, who gave verbal consent to proceed.  History of Present Illness Brenda Barry is an 88 year old female with obstructive sleep apnea and hypertension who presents for a six-month follow-up.  She attempted CPAP therapy for obstructive sleep apnea but was unable to tolerate it due to claustrophobia and difficulty breathing. She tried two different masks without success and has since discontinued its use. She was following up with a pulmonologist but has stopped visits after discussing her experience with CPAP.  She requires a refill for her asthma medication and hydralazine  for blood pressure management. There have been no recent asthma exacerbations. She attributes her recent blood pressure issues to increased salt intake from potato chips. She notes that her blood pressure readings at home are typically better, although she has not checked them  recently. She is currently taking olmesartan  hydrochlorothiazide and hydralazine  for hypertension. No headaches, blurred vision, chest pains, shortness of breath or LE edema.   She is on Synthroid  (levothyroxine  88 mcg) for thyroid  management, with her last thyroid  function test showing a TSH of 2.18. She continues to take an iron  supplement, with no anemia noted on her last blood work. She is also on calcium and vitamin D supplementation for osteopenia.  She inquires about her cholesterol management, mentioning that she is on Lexvio injections. She has not had recent cholesterol levels checked but has an upcoming appointment. No chest pain or shortness of breath. She reports sleeping well at night and her mood has been stable.    Review of Systems:  Review of Systems  Constitutional:  Negative for chills, fever and weight loss.  HENT:  Negative for tinnitus.   Respiratory:  Negative for cough, sputum production and shortness of breath.   Cardiovascular:  Negative for chest pain, palpitations and leg swelling.  Gastrointestinal:  Negative for abdominal pain, constipation, diarrhea and heartburn.  Genitourinary:  Negative for dysuria, frequency and urgency.  Musculoskeletal:  Negative for back pain, falls, joint pain and myalgias.  Skin: Negative.   Neurological:  Negative for dizziness and headaches.  Psychiatric/Behavioral:  Negative for depression and memory loss. The patient does not have insomnia.     Past Medical History:  Diagnosis Date   Anemia    Per [facility] New Patient Packet   Asthma    Per [facility] New Patient Packet   Bilateral carotid bruits 02/12/2019   Coronary artery disease  Glaucoma    Per [facility] New Patient Packet   H/O bone density study    Per [facility] New Patient Packet   H/O heart artery stent    Per [facility] New Patient Packet   H/O mammogram    Per [facility] New Patient Packet   Heart murmur    Per [facility] New Patient Packet   Hyperlipidemia    Hypertension    Hypothyroid    Per  [facility] New Patient Packet   Nerve pain    Per [facility] New Patient Packet   Peripheral artery disease (HCC) 02/12/2019   Prediabetes    Per [facility] New Patient Packet   Shingles    Per [facility] New Patient Packet   Stage 3 chronic kidney disease (HCC)    Per [facility] New Patient Packet   Past Surgical History:  Procedure Laterality Date   BREAST LUMPECTOMY     1980's Per [facility] New Patient Packet   CATARACT EXTRACTION     2018, Per [facility] New Patient Packet   COLONOSCOPY     Per [facility] New Patient Packet   HIP SURGERY Right 10/05/2018   TONSILLECTOMY     1950's Per [facility] New Patient Packet   Social History:   reports that she quit smoking about 52 years ago. Her smoking use included cigarettes. She started smoking about 59 years ago. She has a 1.8 pack-year smoking history. She has never used smokeless tobacco. She reports that she does not drink alcohol and does not use drugs.  Family History  Problem Relation Age of Onset   Tuberculosis Mother    Heart disease Father    Schizophrenia Daughter     Medications: Patient's Medications  New Prescriptions   No medications on file  Previous Medications   ALBUTEROL  (VENTOLIN  HFA) 108 (90 BASE) MCG/ACT INHALER    Inhale 2 puffs into the lungs as needed.   ASPIRIN  (ASPIRIN  CHILDRENS) 81 MG CHEWABLE TABLET    Chew 1 tablet (81 mg total) by mouth daily.   CALCIUM CARBONATE (OSCAL) 1500 (600 CA) MG TABS TABLET    Take 600 mg by mouth daily with breakfast.   CHOLECALCIFEROL 50 MCG (2000 UT) CAPS    Take 1 capsule by mouth daily.   CYANOCOBALAMIN  (VITAMIN B12) 1000 MCG TABLET    Take 1,000 mcg by mouth daily.   FERROUS SULFATE  325 (65 FE) MG EC TABLET    Take 1 tablet (325 mg total) by mouth daily with breakfast.   INCLISIRAN (LEQVIO ) 284 MG/1.5ML SOSY INJECTION    Inject 284 mg into the skin every 6 (six) months.   LATANOPROST (XALATAN) 0.005 % OPHTHALMIC SOLUTION    Place 1 drop into both eyes at bedtime.   LEVOTHYROXINE  (SYNTHROID ) 88 MCG TABLET    Take 1 tablet  (88 mcg total) by mouth daily before breakfast.   OLMESARTAN -HYDROCHLOROTHIAZIDE (BENICAR  HCT) 40-12.5 MG TABLET    TAKE 1 TABLET BY MOUTH DAILY  Modified Medications   Modified Medication Previous Medication   BUDESONIDE -FORMOTEROL  (BREYNA ) 160-4.5 MCG/ACT INHALER budesonide -formoterol  (BREYNA ) 160-4.5 MCG/ACT inhaler      Inhale 2 puffs into the lungs 2 (two) times daily.    USE 2 INHALATIONS ORALLY   TWICE DAILY.   HYDRALAZINE  (APRESOLINE ) 25 MG TABLET hydrALAZINE  (APRESOLINE ) 25 MG tablet      25 mg by mouth at 9 am and 2pm, 50 mg at 8 pm    25 mg by mouth at 9 am and 2pm, 50 mg at 8 pm  Discontinued  Medications   No medications on file    Physical Exam:  Vitals:   03/12/24 0931 03/12/24 0940  BP: (!) 146/80 (!) 150/80  Pulse: 60   Temp: (!) 97.5 F (36.4 C)   SpO2: 98%   Weight: 212 lb 3.2 oz (96.3 kg)   Height: 5' 5.5 (1.664 m)    Body mass index is 34.77 kg/m. Wt Readings from Last 3 Encounters:  03/12/24 212 lb 3.2 oz (96.3 kg)  02/03/24 210 lb 6.4 oz (95.4 kg)  11/05/23 208 lb 12.8 oz (94.7 kg)    Physical Exam Constitutional:      General: She is not in acute distress.    Appearance: She is well-developed. She is not diaphoretic.  HENT:     Head: Normocephalic and atraumatic.     Mouth/Throat:     Pharynx: No oropharyngeal exudate.  Eyes:     Conjunctiva/sclera: Conjunctivae normal.     Pupils: Pupils are equal, round, and reactive to light.  Cardiovascular:     Rate and Rhythm: Normal rate and regular rhythm.     Heart sounds: Normal heart sounds.  Pulmonary:     Effort: Pulmonary effort is normal.     Breath sounds: Normal breath sounds.  Abdominal:     General: Bowel sounds are normal.     Palpations: Abdomen is soft.  Musculoskeletal:     Cervical back: Normal range of motion and neck supple.     Right lower leg: No edema.     Left lower leg: No edema.  Skin:    General: Skin is warm and dry.  Neurological:     Mental Status: She is alert.   Psychiatric:        Mood and Affect: Mood normal.     Labs reviewed: Basic Metabolic Panel: Recent Labs    05/12/23 1034 09/12/23 0954 09/22/23 1043  NA 141 143 139  K 4.7 4.5 3.8  CL 106 105 104  CO2 29 29 30   GLUCOSE 95 94 87  BUN 16 18 17   CREATININE 1.29* 1.52* 1.29*  CALCIUM 10.2 10.2 10.1  TSH  --  2.18  --    Liver Function Tests: Recent Labs    03/31/23 1015 09/12/23 0954 09/22/23 1043  AST 39 19 19  ALT 24 12 12   ALKPHOS 56  --  65  BILITOT 0.4 0.5 0.4  PROT 7.5 7.2 7.7  ALBUMIN 4.1  --  4.2   No results for input(s): LIPASE, AMYLASE in the last 8760 hours. No results for input(s): AMMONIA in the last 8760 hours. CBC: Recent Labs    03/31/23 1015 09/12/23 0954 09/22/23 1043  WBC 7.2 5.0 5.3  NEUTROABS 3.2 1,950 2.0  HGB 12.3 12.2 12.8  HCT 37.6 38.6 39.9  MCV 90.8 91.5 91.5  PLT 298 282 276   Lipid Panel: Recent Labs    09/12/23 0954  CHOL 218*  HDL 82  LDLCALC 115*  TRIG 105  CHOLHDL 2.7   TSH: Recent Labs    09/12/23 0954  TSH 2.18   A1C: No results found for: HGBA1C   Assessment/Plan Acquired hypothyroidism Assessment & Plan: TSH at goal on last labs, continues on levothyroxine  88 mcg daily    Moderate persistent asthma without complication Assessment & Plan: Controlled, no current regimen, no recent exacerbations.   Orders: -     Budesonide -Formoterol  Fumarate; Inhale 2 puffs into the lungs 2 (two) times daily.  Dispense: 6 g; Refill: 5  Essential hypertension  Assessment & Plan: -Blood pressure elevated today but typically well controlled Has had changes in diet with higher sodium encouraged to decrease sodium intake.  -she will work on dietary modfications  -will have pt continue to monitor home bp goal <140/90, to notify if readings remain high on 3 different days  -will have her follow up in 4 weeks with bp readings -follow metabolic panel   Orders: -     hydrALAZINE  HCl; 25 mg by mouth at 9 am and  2pm, 50 mg at 8 pm  Dispense: 360 tablet; Refill: 1 -     Comprehensive metabolic panel with GFR -     CBC with Differential/Platelet  Mixed hyperlipidemia Assessment & Plan: On leqvio  per cardiology Follow up lipids  Orders: -     Lipid panel -     Comprehensive metabolic panel with GFR  Anemia, unspecified type Assessment & Plan: Stable on recent labs, continues on iron  supplement Follow cbc   Osteopenia, unspecified location Assessment & Plan: Recommended to take calcium 600 mg twice daily with Vitamin D 2000 units daily and weight bearing activity 30 mins/5 days a week   Stage 3 chronic kidney disease, unspecified whether stage 3a or 3b CKD (HCC) Assessment & Plan: Chronic and stable Encourage proper hydration Follow metabolic panel Avoid nephrotoxic meds (NSAIDS)    OSA (obstructive sleep apnea) Assessment & Plan: Could not tolerate CPAP due to claustrophobia, has tried 2 different mask.    Coronary artery disease involving native coronary artery of native heart without angina pectoris Assessment & Plan: Stable, continues follow up with cardiologist, continues on asa    Return in about 4 weeks (around 04/09/2024) for blood pressure.  Adama Ivins K. Caro BODILY [facility] [phone]

## 2024-03-13 ENCOUNTER — Ambulatory Visit: Payer: Self-pay | Admitting: Nurse Practitioner

## 2024-03-15 NOTE — Progress Notes (Signed)
 Please make sure she has a follow up with me at some point

## 2024-03-17 ENCOUNTER — Encounter: Payer: Self-pay | Admitting: Podiatry

## 2024-03-17 ENCOUNTER — Ambulatory Visit (INDEPENDENT_AMBULATORY_CARE_PROVIDER_SITE_OTHER): Admitting: Podiatry

## 2024-03-17 DIAGNOSIS — M79674 Pain in right toe(s): Secondary | ICD-10-CM

## 2024-03-17 DIAGNOSIS — M79675 Pain in left toe(s): Secondary | ICD-10-CM | POA: Diagnosis not present

## 2024-03-17 DIAGNOSIS — N183 Chronic kidney disease, stage 3 unspecified: Secondary | ICD-10-CM

## 2024-03-17 DIAGNOSIS — D689 Coagulation defect, unspecified: Secondary | ICD-10-CM | POA: Diagnosis not present

## 2024-03-17 DIAGNOSIS — B351 Tinea unguium: Secondary | ICD-10-CM

## 2024-03-17 NOTE — Progress Notes (Signed)
 This patient returns to my office for at risk foot care.  This patient requires this care by a professional since this patient will be at risk due to having chronic kidney disease and PAD.Marland Kitchen  This patient is unable to cut nails herself since the patient cannot reach her nails.These nails are painful walking and wearing shoes.  This patient presents for at risk foot care today.    General Appearance  Alert, conversant and in no acute stress.  Vascular  Dorsalis pedis and posterior tibial  pulses are  weakly palpable  bilaterally.  Capillary return is within normal limits  bilaterally. Temperature is within normal limits  bilaterally.  Neurologic  Senn-Weinstein monofilament wire test within normal limits  bilaterally. Muscle power within normal limits bilaterally.  Nails Thick disfigured discolored nails with subungual debris  from hallux to fifth toes bilaterally. No evidence of bacterial infection or drainage bilaterally.  Orthopedic  No limitations of motion  feet .  No crepitus or effusions noted.  No bony pathology or digital deformities noted.  HAV  B/L.  Skin  normotropic skin with no porokeratosis noted bilaterally.  No signs of infections or ulcers noted.   Asymptomatic HD 5th B/L.  Onychomycosis  Pain in right toes  Pain in left toes  Consent was obtained for treatment procedures.   Mechanical debridement of nails 1-5  bilaterally performed with a nail nipper.  Filed with dremel without incident.    Return office visit   3 months                   Told patient to return for periodic foot care and evaluation due to potential at risk complications.   Helane Gunther DPM

## 2024-03-18 ENCOUNTER — Other Ambulatory Visit: Payer: Self-pay | Admitting: Nurse Practitioner

## 2024-03-23 ENCOUNTER — Other Ambulatory Visit: Payer: Self-pay

## 2024-03-23 DIAGNOSIS — D510 Vitamin B12 deficiency anemia due to intrinsic factor deficiency: Secondary | ICD-10-CM

## 2024-03-23 DIAGNOSIS — D472 Monoclonal gammopathy: Secondary | ICD-10-CM

## 2024-03-23 DIAGNOSIS — E538 Deficiency of other specified B group vitamins: Secondary | ICD-10-CM

## 2024-03-24 ENCOUNTER — Inpatient Hospital Stay: Attending: Hematology

## 2024-03-24 DIAGNOSIS — E538 Deficiency of other specified B group vitamins: Secondary | ICD-10-CM

## 2024-03-24 DIAGNOSIS — D510 Vitamin B12 deficiency anemia due to intrinsic factor deficiency: Secondary | ICD-10-CM | POA: Insufficient documentation

## 2024-03-24 DIAGNOSIS — D472 Monoclonal gammopathy: Secondary | ICD-10-CM | POA: Insufficient documentation

## 2024-03-24 DIAGNOSIS — M810 Age-related osteoporosis without current pathological fracture: Secondary | ICD-10-CM | POA: Insufficient documentation

## 2024-03-24 DIAGNOSIS — Z79899 Other long term (current) drug therapy: Secondary | ICD-10-CM | POA: Diagnosis not present

## 2024-03-24 LAB — CBC WITH DIFFERENTIAL (CANCER CENTER ONLY)
Abs Immature Granulocytes: 0.01 K/uL (ref 0.00–0.07)
Basophils Absolute: 0.1 K/uL (ref 0.0–0.1)
Basophils Relative: 1 %
Eosinophils Absolute: 0.2 K/uL (ref 0.0–0.5)
Eosinophils Relative: 4 %
HCT: 37 % (ref 36.0–46.0)
Hemoglobin: 12.1 g/dL (ref 12.0–15.0)
Immature Granulocytes: 0 %
Lymphocytes Relative: 51 %
Lymphs Abs: 2.4 K/uL (ref 0.7–4.0)
MCH: 29.4 pg (ref 26.0–34.0)
MCHC: 32.7 g/dL (ref 30.0–36.0)
MCV: 89.8 fL (ref 80.0–100.0)
Monocytes Absolute: 0.4 K/uL (ref 0.1–1.0)
Monocytes Relative: 8 %
Neutro Abs: 1.7 K/uL (ref 1.7–7.7)
Neutrophils Relative %: 36 %
Platelet Count: 270 K/uL (ref 150–400)
RBC: 4.12 MIL/uL (ref 3.87–5.11)
RDW: 14.1 % (ref 11.5–15.5)
WBC Count: 4.7 K/uL (ref 4.0–10.5)
nRBC: 0 % (ref 0.0–0.2)

## 2024-03-24 LAB — CMP (CANCER CENTER ONLY)
ALT: 14 U/L (ref 0–44)
AST: 21 U/L (ref 15–41)
Albumin: 4.1 g/dL (ref 3.5–5.0)
Alkaline Phosphatase: 62 U/L (ref 38–126)
Anion gap: 6 (ref 5–15)
BUN: 24 mg/dL — ABNORMAL HIGH (ref 8–23)
CO2: 30 mmol/L (ref 22–32)
Calcium: 10 mg/dL (ref 8.9–10.3)
Chloride: 104 mmol/L (ref 98–111)
Creatinine: 1.37 mg/dL — ABNORMAL HIGH (ref 0.44–1.00)
GFR, Estimated: 38 mL/min — ABNORMAL LOW (ref >60)
Glucose, Bld: 93 mg/dL (ref 70–99)
Potassium: 4.1 mmol/L (ref 3.5–5.1)
Sodium: 140 mmol/L (ref 135–145)
Total Bilirubin: 0.4 mg/dL (ref 0.0–1.2)
Total Protein: 7.7 g/dL (ref 6.5–8.1)

## 2024-03-24 LAB — IRON AND IRON BINDING CAPACITY (CC-WL,HP ONLY)
Iron: 58 ug/dL (ref 28–170)
Saturation Ratios: 20 % (ref 10.4–31.8)
TIBC: 286 ug/dL (ref 250–450)
UIBC: 228 ug/dL (ref 148–442)

## 2024-03-24 LAB — FERRITIN: Ferritin: 184 ng/mL (ref 11–307)

## 2024-03-24 LAB — VITAMIN B12: Vitamin B-12: 1795 pg/mL — ABNORMAL HIGH (ref 180–914)

## 2024-03-29 LAB — MULTIPLE MYELOMA PANEL, SERUM
Albumin SerPl Elph-Mcnc: 3.5 g/dL (ref 2.9–4.4)
Albumin/Glob SerPl: 1.1 (ref 0.7–1.7)
Alpha 1: 0.3 g/dL (ref 0.0–0.4)
Alpha2 Glob SerPl Elph-Mcnc: 0.8 g/dL (ref 0.4–1.0)
B-Globulin SerPl Elph-Mcnc: 1.1 g/dL (ref 0.7–1.3)
Gamma Glob SerPl Elph-Mcnc: 1.3 g/dL (ref 0.4–1.8)
Globulin, Total: 3.4 g/dL (ref 2.2–3.9)
IgA: 146 mg/dL (ref 64–422)
IgG (Immunoglobin G), Serum: 1514 mg/dL (ref 586–1602)
IgM (Immunoglobulin M), Srm: 43 mg/dL (ref 26–217)
M Protein SerPl Elph-Mcnc: 0.9 g/dL — ABNORMAL HIGH
Total Protein ELP: 6.9 g/dL (ref 6.0–8.5)

## 2024-04-06 ENCOUNTER — Ambulatory Visit: Admitting: Primary Care

## 2024-04-07 ENCOUNTER — Telehealth: Admitting: Hematology

## 2024-04-12 ENCOUNTER — Encounter: Payer: Self-pay | Admitting: Nurse Practitioner

## 2024-04-12 ENCOUNTER — Ambulatory Visit (INDEPENDENT_AMBULATORY_CARE_PROVIDER_SITE_OTHER): Admitting: Nurse Practitioner

## 2024-04-12 VITALS — BP 148/90 | HR 59 | Temp 97.3°F | Ht 65.5 in | Wt 205.0 lb

## 2024-04-12 DIAGNOSIS — I10 Essential (primary) hypertension: Secondary | ICD-10-CM

## 2024-04-12 MED ORDER — ADULT BLOOD PRESSURE CUFF LG KIT
PACK | 0 refills | Status: AC
Start: 2024-04-12 — End: ?

## 2024-04-12 MED ORDER — BLOOD PRESSURE CUFF MISC: 0 refills | Status: DC

## 2024-04-12 NOTE — Progress Notes (Signed)
 Careteam: Patient Care Team: [name], NP as PCP - General (Geriatric Medicine) Loreda Hacker, DPM as Consulting Physician (Podiatry) Ladona Heinz, MD as Consulting Physician (Cardiology) Octavia Charleston, MD as Referring Physician (Ophthalmology) Jerrye Katheryn BROCKS, MD as Consulting Physician (Nephrology)  PLACE OF SERVICE:  [facility]  Advanced Directive information    Allergies  Allergen Reactions   Amlodipine Besylate Other (See Comments)    Nightmares and nerve pain Nightmares and nerve pain   Atorvastatin Other (See Comments)    nightmare nightmare   Isosorbide  Dinitrate Other (See Comments)    Nerve pain, lower back pain, feels out of it Nerve pain, lower back pain, feels out of it   Breo Ellipta  [Fluticasone  Furoate-Vilanterol] Other (See Comments)    Tongue swelling   Rosuvastatin Other (See Comments)    Nightmare.  Hence takes it during day    Chief Complaint  Patient presents with   Medical Management of Chronic Issues    4 week follow-up    HPI:  Patient is a 88 y.o. female   with a history of CKD 3b, OSA, hyperilipdemia, CAD, osteopenia, anemia , and asthma seen in today for a blood pressure follow up.   Patient associates elevated blood pressures with office visits (white coat syndrome). States any time she visits the doctor she gets nervous. She has not been checking her blood pressures at home due to having the wrong cuff size. She denies chest pain, shortness of breath, palpitations, dizziness, and swelling.   She expresses external stress due to role responsibility and limited support.   She only eats 2 meals a day, breakfast and dinner, with a light snack of trail mix and fruit.  She has limited salt intake. She reports being active around the house.   She is adherent with all of her medications. However, reports nightmares, muscle pain, and nerve pain following the use of hydralazine  however does not wish to change or switch medication at this  time.   Review of Systems:  Review of Systems  Constitutional:  Negative for malaise/fatigue and weight loss.  Eyes:  Negative for blurred vision, double vision and photophobia.  Respiratory:  Negative for shortness of breath and wheezing.   Cardiovascular:  Negative for chest pain, palpitations, orthopnea and leg swelling.  Gastrointestinal:  Negative for abdominal pain, diarrhea, nausea and vomiting.  Genitourinary:  Negative for dysuria, frequency and urgency.  Skin:  Negative for itching and rash.  Neurological:  Negative for dizziness, tingling, weakness and headaches.    Past Medical History:  Diagnosis Date   Anemia    Per [facility] New Patient Packet   Asthma    Per [facility] New Patient Packet   Bilateral carotid bruits 02/12/2019   Coronary artery disease    Glaucoma    Per [facility] New Patient Packet   H/O bone density study    Per [facility]   H/O heart artery stent    Per [facility] Packet   H/O mammogram    Per [facility]   Heart murmur    Per [facility] Patient Packet   Hyperlipidemia    Hypertension    Hypothyroid    Per [facility] New Patient Packet   Nerve pain    Per [facility] New Patient Packet   Peripheral artery disease (HCC) 02/12/2019   Prediabetes    Per [facility] New Patient Packet   Shingles    Per [facility] New Patient Packet  Stage 3 chronic kidney disease (HCC)    Per [facility] New Patient Packet   Past Surgical History:  Procedure Laterality Date   BREAST LUMPECTOMY     1980's Per [facility] New Patient Packet   CATARACT EXTRACTION     2018, Per [facility] New Patient Packet   COLONOSCOPY     Per [facility] New Patient Packet   HIP SURGERY Right 10/05/2018   TONSILLECTOMY     1950's Per [facility] New Patient Packet   Social History:   reports that she quit smoking about 53 years ago. Her smoking use included cigarettes. She started smoking about 60 years ago. She has a 1.8 pack-year smoking history. She has never used smokeless tobacco. She reports that she does not drink  alcohol and does not use drugs.  Family History  Problem Relation Age of Onset   Tuberculosis Mother    Heart disease Father    Schizophrenia Daughter     Medications: Patient's Medications  New Prescriptions   No medications on file  Previous Medications   ALBUTEROL  (VENTOLIN  HFA) 108 (90 BASE) MCG/ACT INHALER    Inhale 2 puffs into the lungs as needed.   ASPIRIN  (ASPIRIN  CHILDRENS) 81 MG CHEWABLE TABLET    Chew 1 tablet (81 mg total) by mouth daily.   BUDESONIDE -FORMOTEROL  (BREYNA ) 160-4.5 MCG/ACT INHALER    Inhale 2 puffs into the lungs 2 (two) times daily.   CALCIUM CARBONATE (OSCAL) 1500 (600 CA) MG TABS TABLET    Take 600 mg by mouth daily with breakfast.   CHOLECALCIFEROL 50 MCG (2000 UT) CAPS    Take 1 capsule by mouth daily.   CYANOCOBALAMIN  (VITAMIN B12) 1000 MCG TABLET    Take 1,000 mcg by mouth daily.   FERROUS SULFATE  325 (65 FE) MG EC TABLET    Take 1 tablet (325 mg total) by mouth daily with breakfast.   HYDRALAZINE  (APRESOLINE ) 25 MG TABLET    25 mg by mouth at 9 am and 2pm, 50 mg at 8 pm   INCLISIRAN (LEQVIO ) 284 MG/1.5ML SOSY INJECTION    Inject 284 mg into the skin every 6 (six) months.   LATANOPROST (XALATAN) 0.005 % OPHTHALMIC SOLUTION    Place 1 drop into both eyes at bedtime.   LEVOTHYROXINE  (SYNTHROID ) 88 MCG TABLET    Take 1 tablet (88 mcg total) by mouth daily before breakfast.   OLMESARTAN -HYDROCHLOROTHIAZIDE (BENICAR  HCT) 40-12.5 MG TABLET    TAKE 1 TABLET BY MOUTH DAILY   UNABLE TO FIND    Place 1 drop into both eyes as needed. Med Name: Always Eye Drops  Modified Medications   No medications on file  Discontinued Medications   No medications on file    Physical Exam:  Vitals:   04/12/24 0811 04/12/24 0916  BP: (!) 150/92 (!) 148/90  Pulse: (!) 59   Temp: (!) 97.3 F (36.3 C)   SpO2: 99%   Weight: 93 kg   Height: 5' 5.5 (1.664 m)    Body mass index is 33.59 kg/m. Wt Readings from Last 3 Encounters:  04/12/24 93 kg  03/12/24 96.3 kg   02/03/24 95.4 kg    Physical Exam Constitutional:      General: She is not in acute distress.    Appearance: She is not ill-appearing or diaphoretic.  HENT:     Head: Normocephalic.  Eyes:     Pupils: Pupils are equal, round, and reactive to light.  Cardiovascular:     Rate and Rhythm: Normal rate and  regular rhythm.     Pulses: Normal pulses.     Heart sounds: Normal heart sounds.  Pulmonary:     Effort: Pulmonary effort is normal. No respiratory distress.     Breath sounds: No wheezing.  Abdominal:     General: Bowel sounds are normal.     Palpations: Abdomen is soft.  Musculoskeletal:        General: Normal range of motion.     Cervical back: Normal range of motion and neck supple.  Skin:    General: Skin is warm and dry.  Neurological:     General: No focal deficit present.     Mental Status: She is alert and oriented to person, place, and time. Mental status is at baseline.     Labs reviewed: Basic Metabolic Panel: Recent Labs    09/12/23 0954 09/22/23 1043 03/12/24 1000 03/24/24 1021  NA 143 139 141 140  K 4.5 3.8 4.5 4.1  CL 105 104 105 104  CO2 29 30 28 30   GLUCOSE 94 87 86 93  BUN 18 17 19  24*  CREATININE 1.52* 1.29* 1.37* 1.37*  CALCIUM 10.2 10.1 10.0 10.0  TSH 2.18  --   --   --    Liver Function Tests: Recent Labs    09/22/23 1043 03/12/24 1000 03/24/24 1021  AST 19 20 21   ALT 12 14 14   ALKPHOS 65  --  62  BILITOT 0.4 0.3 0.4  PROT 7.7 7.0 7.7  ALBUMIN 4.2  --  4.1   No results for input(s): LIPASE, AMYLASE in the last 8760 hours. No results for input(s): AMMONIA in the last 8760 hours. CBC: Recent Labs    09/22/23 1043 03/12/24 1000 03/24/24 1021  WBC 5.3 5.7 4.7  NEUTROABS 2.0 2,309 1.7  HGB 12.8 11.5* 12.1  HCT 39.9 37.1 37.0  MCV 91.5 93.2 89.8  PLT 276 279 270   Lipid Panel: Recent Labs    09/12/23 0954 03/12/24 1000  CHOL 218* 234*  HDL 82 79  LDLCALC 115* 139*  TRIG 105 68  CHOLHDL 2.7 3.0    TSH: Recent Labs    09/12/23 0954  TSH 2.18   A1C: No results found for: HGBA1C   Assessment/Plan  1. Essential hypertension (Primary) - continues to be elevated, likely some white coat syndrome. Ordered cuff so she can take bp at home.   - Monitor blood pressures daily with goal <140/90 - Discussed discussed reducing external stressors as tolerates Will continue current regimen- she does not wish change medications this time.  - Maintain low sodium diet  - New blood pressure cuff order   Chiquasia Morris, FNP-S -I personally was present during the history, physical exam and medical decision-making activities of this service and have verified that the service and findings are accurately documented in the student's note Jahmari Esbenshade K. Caro BODILY [facility] [phone]

## 2024-04-14 ENCOUNTER — Inpatient Hospital Stay (HOSPITAL_BASED_OUTPATIENT_CLINIC_OR_DEPARTMENT_OTHER): Admitting: Hematology

## 2024-04-14 DIAGNOSIS — D510 Vitamin B12 deficiency anemia due to intrinsic factor deficiency: Secondary | ICD-10-CM

## 2024-04-14 DIAGNOSIS — D472 Monoclonal gammopathy: Secondary | ICD-10-CM

## 2024-04-14 DIAGNOSIS — E538 Deficiency of other specified B group vitamins: Secondary | ICD-10-CM

## 2024-04-14 NOTE — Progress Notes (Signed)
 HEMATOLOGY ONCOLOGY PHONE VISIT NOTE  Date of service: 04/14/2024  Patient Care Team: Brenda Harlene POUR, NP as PCP - General (Geriatric Medicine) Brenda Barry, DPM as Consulting Physician (Podiatry) Brenda Heinz, MD as Consulting Physician (Cardiology) Brenda Charleston, MD as Referring Physician (Ophthalmology) Brenda Katheryn BROCKS, MD as Consulting Physician (Nephrology)  CHIEF COMPLAINTS/PURPOSE OF CONSULTATION:  Continued follow-up for MGUS and management of pernicious anemia  HISTORY OF PRESENTING ILLNESS:  Brenda Barry is a wonderful 88 y.o. female who has been referred to us  by Dr. Ozell Reilly for evaluation and management of Monoclonal Paraproteinemia. The pt reports that she is doing well overall.    The pt reports that she has had sciatica in the last year with referred pain down her leg, caused by a pinched nerve at L4/5.  The pt is intending to see Dr. Michaela in neurosurgery soon. The pt's lower back pain has been evaluated with an MRI most recently on 04/24/18. The pt is also being evaluated by cardiology.   The pt denies any neck pain whatsoever and denies any new bone pains.    The pt notes that her kidney functions have been stable recently and has been pursuing care with [facility]. Starleen Monte at [facility]. The pt notes that her blood pressure has been high, and is elevated more when she sees physicians. She has taken HCTZ, Toprol -XL, and Hydralazine . The pt notes that her thyroid  medication has recently been adjusted.    Of note prior to the patient's presentation, the pt had a Bone Survey on 01/03/17 which revealed No definite lytic lesions within the axial skeleton. 2. Mixed lytic and blastic lesion posteriorly at C7. CT or MRI could be used for further evaluation of the cervical spine. 3. 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 No discrete lytic lesions are present.    Subsequently the pt then had a  CT Cervical Spine on 02/21/17 which revealed CT scan does not show a convincing abnormality within the C7 vertebral body. However, within the posterior C3 vertebral body, there is a well-circumscribed lucent area measuring 8 x 4 x 5 mm which, though not specific or conclusive, could represent a focus of myeloma.   Most recent lab results (04/21/18) of CMP is as follows: all values are WNL except for Glucose at 104, Creatinine at 1.07, GFR at 57, BUN at 10, Sodium at 145, Phosphorous at 2.2. 04/21/18 SPEP revealed all values WNL except for M-spike at 0.7, with IgG monoclonal protein with kappa light chain specificity.    On review of systems, pt reports sciatica, stable energy levels, stable lower back pain, and denies any neck pain, new bone pains, abdominal pains, chest wall pain, and any other symptoms.    On PMHx the pt reports Osteoporosis, Hypothyroidism, Hyperlipidemia, CAD with 5p stent place in late 90s, benign lumpectomy of right breast, HTN, IgG monoclonal gammopathy, CKD stage III.    INTERVAL HISTORY: Brenda Barry is here for her 33-month follow-up for continued evaluation and management of pernicious anemia and surveillance of MGUS.   I connected with  Brenda Barry Sharps on 04/14/24 at 3:30 PM by telephone visit and verified that I am speaking with the correct person using two identifiers. Other persons involved in this visit: medical scribe, Brenda Barry.  Patient's location: home  Provider's location: [facility]   I last connected with the patient, tele-med visit, on 10/06/2023, and she was doing well overall.   Today, she endorses  doing and feeling well overall since we last connected. She has been compliant with her daily iron  supplementation Ferrous Sulfate  325 mg daily and Vitamin-B12 1,000 mcg daily.  REVIEW OF SYSTEMS:    10 Point review of systems of done and is negative except as noted above.  Past Medical History:  Diagnosis Date   Anemia    Per [facility] New Patient Packet    Asthma    Per [facility] New Patient Packet   Bilateral carotid bruits 02/12/2019   Coronary artery disease    Glaucoma    Per [facility] New Patient Packet   H/O bone density study    Per [facility] New Patient Packet   H/O heart artery stent    Per [facility] New Patient Packet   H/O mammogram    Per [facility] New Patient Packet   Heart murmur    Per [facility] New Patient Packet   Hyperlipidemia    Hypertension    Hypothyroid    Per [facility] New Patient Packet   Nerve pain    Per [facility] New Patient Packet   Peripheral artery disease 02/12/2019   Prediabetes    Per [facility] New Patient Packet   Shingles    Per [facility] New Patient Packet   Stage 3 chronic kidney disease (HCC)    Per [facility] New Patient Packet     Past Surgical History:  Procedure Laterality Date   BREAST LUMPECTOMY     1980's Per [facility] New Patient Packet   CATARACT EXTRACTION     2018, Per [facility] New Patient Packet   COLONOSCOPY     Per [facility] New Patient Packet   HIP SURGERY Right 10/05/2018   TONSILLECTOMY     1950's Per [facility] New Patient Packet    Social History   Tobacco Use   Smoking status: Former    Current packs/day: 0.00    Average packs/day: 0.3 packs/day for 7.0 years (1.8 ttl pk-yrs)    Types: Cigarettes    Start date: 04/18/1964    Quit date: 04/19/1971    Years since quitting: 53.0   Smokeless tobacco: Never  Vaping Use   Vaping status: Never Used  Substance Use Topics   Alcohol use: No   Drug use: No   Social History   Social History Narrative   Diet: all types of foods       Caffeine: Cola and coffee      Married, if yes what year: Widowed, 1962 year married       Do you live in a house, apartment, assisted living, condo, trailer, ect: House       Is it one or more stories: One stories       How many persons live in your home? 1 daughter, during school 2 grandsons       Pets: None      Highest level or education completed: 12th grade       Current/Past profession: Visual merchandiser       Exercise: NO        Type and how often:           Living Will: let blank    DNR: No   POA/HPOA: Yes      Functional Status:   Do you have difficulty bathing or dressing yourself? No   Do you have difficulty preparing food or eating? No   Do you have difficulty managing your medications? No   Do you have difficulty managing your finances? No   Do  you have difficulty affording your medications? No   ALLERGIES:  is allergic to amlodipine besylate, atorvastatin, isosorbide  dinitrate, breo ellipta  [fluticasone  furoate-vilanterol], and rosuvastatin.  MEDICATIONS:  Current Outpatient Medications  Medication Sig Dispense Refill   albuterol  (VENTOLIN  HFA) 108 (90 Base) MCG/ACT inhaler Inhale 2 puffs into the lungs as needed. 6.7 g 1   aspirin  (ASPIRIN  CHILDRENS) 81 MG chewable tablet Chew 1 tablet (81 mg total) by mouth daily.     Blood Pressure Monitoring (ADULT BLOOD PRESSURE CUFF LG) KIT To check blood pressure 1 hour AFTER you have had your medication Make sure you have been sitting at least 5 mins Record and let us  know. Goal <140/90 1 kit 0   budesonide -formoterol  (BREYNA ) 160-4.5 MCG/ACT inhaler Inhale 2 puffs into the lungs 2 (two) times daily. 6 g 5   calcium carbonate (OSCAL) 1500 (600 Ca) MG TABS tablet Take 600 mg by mouth daily with breakfast.     Cholecalciferol 50 MCG (2000 UT) CAPS Take 1 capsule by mouth daily.     cyanocobalamin  (VITAMIN B12) 1000 MCG tablet Take 1,000 mcg by mouth daily.     ferrous sulfate  325 (65 FE) MG EC tablet Take 1 tablet (325 mg total) by mouth daily with breakfast.     hydrALAZINE  (APRESOLINE ) 25 MG tablet 25 mg by mouth at 9 am and 2pm, 50 mg at 8 pm 360 tablet 1   inclisiran (LEQVIO ) 284 MG/1.5ML SOSY injection Inject 284 mg into the skin every 6 (six) months.     latanoprost (XALATAN) 0.005 % ophthalmic solution Place 1 drop into both eyes at bedtime.     levothyroxine  (SYNTHROID ) 88 MCG tablet Take 1 tablet (88 mcg total) by mouth daily before breakfast. 90 tablet 1    olmesartan -hydrochlorothiazide (BENICAR  HCT) 40-12.5 MG tablet TAKE 1 TABLET BY MOUTH DAILY 90 tablet 1   UNABLE TO FIND Place 1 drop into both eyes as needed. Med Name: Always Eye Drops     No current facility-administered medications for this visit.    PHYSICAL EXAMINATION: Telemedicine visit LABORATORY DATA:   I have reviewed the data as listed     Latest Ref Rng & Units 03/24/2024   10:21 AM 03/12/2024   10:00 AM 09/22/2023   10:43 AM  CBC  WBC 4.0 - 10.5 K/uL 4.7  5.7  5.3   Hemoglobin 12.0 - 15.0 g/dL 87.8  88.4  87.1   Hematocrit 36.0 - 46.0 % 37.0  37.1  39.9   Platelets 150 - 400 K/uL 270  279  276      Iron  and Iron  Binding Capacity  03/31/2023 09/22/2023 03/24/2024  Iron      28 - 170 ug/dL 54  63  58   TIBC     749 - 450 ug/dL 706  705  713   Saturation Ratios     10.4 - 31.8 % 19  21  20    UIBC     148 - 442 ug/dL 760  768  771    Ferritin     11 - 307 ng/mL 03/31/2023 09/22/2023 03/24/2024   115  98  184      Multiple Myeloma Panel  03/31/2023 09/22/2023 03/24/2024  IgG (Immunoglobin G), Serum     586 - 1,602 mg/dL 8,449  8,447  8,485   IgA     64 - 422 mg/dL 860  852  853   IgM (Immunoglobulin M), Srm     26 - 217 mg/dL 41  37  43   Total Protein ELP     6.0 - 8.5 g/dL 7.0 (C) 7.0 (C) 6.9 (C)  Albumin SerPl Elph-Mcnc     2.9 - 4.4 g/dL 3.7 (C) 3.7 (C) 3.5 (C)  Alpha 1     0.0 - 0.4 g/dL 0.3 (C) 0.2 (C) 0.3 (C)  Alpha2 Glob SerPl Elph-Mcnc     0.4 - 1.0 g/dL 0.7 (C) 0.7 (C) 0.8 (C)  B-Globulin SerPl Elph-Mcnc     0.7 - 1.3 g/dL 0.9 (C) 1.0 (C) 1.1 (C)  Gamma Glob SerPl Elph-Mcnc     0.4 - 1.8 g/dL 1.3 (C) 1.4 (C) 1.3 (C)  M Protein SerPl Elph-Mcnc     Not Observed g/dL 1.1 (H) (C) 0.9 (H) (C) 0.9 (H) (C)  Globulin, Total     2.2 - 3.9 g/dL 3.3 (C) 3.3 (C) 3.4 (C)  Albumin/Glob SerPl     0.7 - 1.7  1.2 (C) 1.2 (C) 1.1 (C)  IFE 1 Comment ! (C) Comment ! (C) Comment ! (C)  Please Note (HCV): Comment (C) Comment (C) Comment (C)        Latest Ref Rng & Units  03/24/2024   10:21 AM 03/12/2024   10:00 AM 09/22/2023   10:43 AM  CMP  Glucose 70 - 99 mg/dL 93  86  87   BUN 8 - 23 mg/dL 24  19  17    Creatinine 0.44 - 1.00 mg/dL 8.62  8.62  8.70   Sodium 135 - 145 mmol/L 140  141  139   Potassium 3.5 - 5.1 mmol/L 4.1  4.5  3.8   Chloride 98 - 111 mmol/L 104  105  104   CO2 22 - 32 mmol/L 30  28  30    Calcium 8.9 - 10.3 mg/dL 89.9  89.9  89.8   Total Protein 6.5 - 8.1 g/dL 7.7  7.0  7.7   Total Bilirubin 0.0 - 1.2 mg/dL 0.4  0.3  0.4   Alkaline Phos 38 - 126 U/L 62   65   AST 15 - 41 U/L 21  20  19    ALT 0 - 44 U/L 14  14  12     Lab Results  Component Value Date   VITAMINB12 1,795 (H) 03/24/2024   VITAMINB12 720 09/22/2023   VITAMINB12 1,414 (H) 03/31/2023   RADIOGRAPHIC STUDIES: I have personally reviewed the radiological images as listed and agreed with the findings in the report. No results found.  ASSESSMENT & PLAN:  88 y.o. female with   1. MGUS- Monoclonal Gammopathy or undetermined significance. 01/03/17 Bone Survey revealed No definite lytic lesions within the axial skeleton. 2. Mixed lytic and blastic lesion posteriorly at C7. CT or MRI could be used for further evaluation of the cervical spine. 3. 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 No discrete lytic lesions are present.  02/21/17 CT Cervical Spine revealed CT scan does not show a convincing abnormality within the C7 vertebral body. However, within the posterior C3 vertebral body, there is a well-circumscribed lucent area measuring 8 x 4 x 5 mm which, though not specific or conclusive, could represent a focus of myeloma.  04/24/18 MRI Lumbar Spine revealed degenerative disc disease, multilevel disc bulges and disc osteophytes and advised that the pt speak with her PCP regarding 2.9cm cystic lesion in right adnexa  Labs from initial presentation from 04/21/18: M Protein at 0.7g   2. Pernicious anemia B12 levels today  elevated 9/03 at 1795.   PLAN:  Reviewed lab results from 03/24/2024 extensively with patient: CBC stable along with IronSat% 19 and Ferritin 115, CMP stable overall with Creatinine elevated at 1.37 from 1.29 in March. Also reviewed Multiple Myeloma Panel results: M-protein remains stable at 0.9 since March. Thus no lab/clinical evidence of MGUS progression.  Based off stable labs, have instructed patient to decrease her Ferrous Sulfate  325 mg to 3 days/ week and  Vitamin B12 1000 mcg to 5 days/ week Discussed deferring condition monitoring to PCP with as-needed follow-ups with myself, which patient is agreeable with.  Follow-up Return to clinic with PCP  The total time spent in the appointment was 20 minutes*.  All of the patient's questions were answered with apparent satisfaction. The patient knows to call the clinic with any problems, questions or concerns.   Emaline Saran MD MS AAHIVMS [facility] Hematology/Oncology Physician [facility]  .*Total Encounter Time as defined by the Centers for Medicare and Medicaid Services includes, in addition to the face-to-face time of a patient visit (documented in the note above) non-face-to-face time: obtaining and reviewing outside history, ordering and reviewing medications, tests or procedures, care coordination (communications with other health care professionals or caregivers) and documentation in the medical record.   I,Emily Lagle,acting as a Neurosurgeon for Emaline Saran, MD.,have documented all relevant documentation on the behalf of Emaline Saran, MD,as directed by  Emaline Saran, MD while in the presence of Emaline Saran, MD.   .I have reviewed the above documentation for accuracy and completeness, and I agree with the above. .Mukund Weinreb Kishore Kamori Barbier MD

## 2024-04-21 ENCOUNTER — Ambulatory Visit: Admitting: Primary Care

## 2024-04-21 ENCOUNTER — Encounter: Payer: Self-pay | Admitting: Cardiology

## 2024-05-06 ENCOUNTER — Ambulatory Visit

## 2024-05-06 VITALS — BP 188/71 | HR 68 | Temp 97.8°F | Resp 18 | Ht 65.0 in | Wt 204.4 lb

## 2024-05-06 DIAGNOSIS — E782 Mixed hyperlipidemia: Secondary | ICD-10-CM

## 2024-05-06 MED ORDER — INCLISIRAN SODIUM 284 MG/1.5ML ~~LOC~~ SOSY
284.0000 mg | PREFILLED_SYRINGE | Freq: Once | SUBCUTANEOUS | Status: AC
  Administered 2024-05-06: 284 mg via SUBCUTANEOUS
  Filled 2024-05-06: qty 1.5

## 2024-05-06 NOTE — Progress Notes (Signed)
 Diagnosis: Hyperlipidemia  Provider:  Mannam, Praveen MD  Procedure: Injection  Leqvio  (inclisiran), Dose: 284 mg, Site: subcutaneous, Number of injections: 1  Injection Site(s): Left lower quad. abdomen  Post Care: left lower quad abdominal injection  Discharge: Condition: Good, Destination: Home . AVS Provided  Performed by:  Lauran Pollard, LPN

## 2024-05-10 ENCOUNTER — Ambulatory Visit: Attending: Cardiology | Admitting: Cardiology

## 2024-05-10 ENCOUNTER — Encounter: Payer: Self-pay | Admitting: Cardiology

## 2024-05-10 VITALS — BP 140/70 | HR 96 | Resp 16 | Ht 65.0 in

## 2024-05-10 DIAGNOSIS — I6523 Occlusion and stenosis of bilateral carotid arteries: Secondary | ICD-10-CM | POA: Diagnosis not present

## 2024-05-10 DIAGNOSIS — I2510 Atherosclerotic heart disease of native coronary artery without angina pectoris: Secondary | ICD-10-CM | POA: Diagnosis not present

## 2024-05-10 DIAGNOSIS — E7800 Pure hypercholesterolemia, unspecified: Secondary | ICD-10-CM | POA: Diagnosis not present

## 2024-05-10 MED ORDER — EZETIMIBE 10 MG PO TABS: 10.0000 mg | ORAL_TABLET | Freq: Every day | ORAL | 3 refills | Status: DC

## 2024-05-10 NOTE — Patient Instructions (Addendum)
 Medication Instructions:  Your physician has recommended you make the following change in your medication:  Start zetia  10mg  daily  Lab Work: None ordered.  You may go to any [facility] Location for your lab work:  [facility] - [address] ([facility]) - [address] [phone] [address]  [place] - [address]   [place]  - [address]   [place] - [address] - [address] [employer]  - [address] [place] [address] ([facility]   If you have labs (blood work) drawn today and your tests are completely normal, you will receive your results only by: [employer] (if you have MyChart)  If you have any lab test that is abnormal or we need to change your treatment, we will call you or send a MyChart message to review the results.  Testing/Procedures: Carotid duplex in a year before next appointment  Follow-Up: At [facility], you and your health needs are our priority.  As part of our continuing mission to provide you with exceptional heart care, we have created designated Provider Care Teams.  These Care Teams include your primary Cardiologist (physician) and Advanced Practice Providers (APPs -  Physician Assistants and Nurse Practitioners) who all work together to provide you with the care you need, when you need it.  Your next appointment:   1 year(s)  The format for your next appointment:   In Person  Provider:   Gordy Bergamo, MD

## 2024-05-10 NOTE — Progress Notes (Signed)
 Cardiology Office Note:  .   Date:  05/10/2024  ID:  Brenda Barry, DOB April 16, 1936, MRN [id number] PCP: [name], NP  [facility] Providers Cardiologist:  None   History of Present Illness: Brenda   SULTANA Barry is a 88 y.o. African-American female with known coronary artery disease with remote coronary stenting 2001 and history of brachytherapy due to restenosis, difficult to control hypertension, asymptomatic bilateral carotid artery disease, PAD with both neurogenic claudication and vascular claudication, she has known right SFA occlusion, being managed medically, MGUS, stage III chronic kidney disease due to hypertension, hyperlipidemia.  Renal artery duplex on 10/27/2019 revealed no evidence of renal artery stenosis.  Cardiac Studies relevent.    Renal artery duplex  10/27/2019:  No evidence of renal artery occlusive disease in either renal artery.   Echocardiogram 07/07/2020:  Normal LV systolic function with EF 55%. Left ventricle cavity is normal in size. Normal global wall motion. Doppler evidence of grade I (impaired) diastolic dysfunction. Calculated EF 55%. Left atrial cavity is mildly dilated.    Discussed the use of AI scribe software for clinical note transcription with the patient, who gave verbal consent to proceed.  History of Present Illness Brenda Barry is an 88 year old female with carotid artery stenosis and coronary artery disease who presents for cardiovascular follow-up.  She has left carotid artery stenosis, stable at around 50% since 2020, and experiences fatigue when walking uphill without new symptoms like chest pain or dyspnea. She has coronary artery disease with a stent placed in 2001. Her last echocardiogram was in 2021. She takes Leqvio  biannually for cholesterol management and cannot tolerate statins due to myalgia. Her blood pressure was initially high but decreased after resting. She takes olmesartan  40 mg and hydrochlorothiazide  25 mg, adjusted by her nephrologist, and previously stopped hydralazine  due to fatigue and back pain. She monitors her blood pressure at home.  Labs   Lab Results  Component Value Date   CHOL 234 (H) 03/12/2024   HDL 79 03/12/2024   LDLCALC 139 (H) 03/12/2024   TRIG 68 03/12/2024   CHOLHDL 3.0 03/12/2024   No results found for: LIPOA  Recent Labs    09/22/23 1043 03/12/24 1000 03/24/24 1021  NA 139 141 140  K 3.8 4.5 4.1  CL 104 105 104  CO2 30 28 30   GLUCOSE 87 86 93  BUN 17 19 24*  CREATININE 1.29* 1.37* 1.37*  CALCIUM 10.1 10.0 10.0  GFRNONAA 41*  --  38*    Lab Results  Component Value Date   ALT 14 03/24/2024   AST 21 03/24/2024   ALKPHOS 62 03/24/2024   BILITOT 0.4 03/24/2024      Latest Ref Rng & Units 03/24/2024   10:21 AM 03/12/2024   10:00 AM 09/22/2023   10:43 AM  CBC  WBC 4.0 - 10.5 K/uL 4.7  5.7  5.3   Hemoglobin 12.0 - 15.0 g/dL 87.8  88.4  87.1   Hematocrit 36.0 - 46.0 % 37.0  37.1  39.9   Platelets 150 - 400 K/uL 270  279  276    No results found for: HGBA1C  Lab Results  Component Value Date   TSH 2.18 09/12/2023     ROS  Review of Systems  Cardiovascular:  Negative for chest pain, dyspnea on exertion and leg swelling.   Physical Exam:   VS:  BP (!) 140/70 (BP Location: Left Arm, Patient Position: Sitting, Cuff Size: Normal)  Pulse 96   Resp 16   Ht 5' 5 (1.651 m)   LMP  (LMP Unknown)   SpO2 95%   BMI 34.01 kg/m    Wt Readings from Last 3 Encounters:  05/06/24 204 lb 6.4 oz (92.7 kg)  04/12/24 205 lb (93 kg)  03/12/24 212 lb 3.2 oz (96.3 kg)    BP Readings from Last 3 Encounters:  05/10/24 (!) 140/70  05/06/24 (!) 188/71  04/12/24 (!) 148/90   Physical Exam Neck:     Vascular: Carotid bruit (left) present. No JVD.  Cardiovascular:     Rate and Rhythm: Normal rate and regular rhythm.     Pulses: Intact distal pulses.     Heart sounds: S1 normal and S2 normal. Murmur heard.     Early systolic murmur is present with a  grade of 2/6 at the upper right sternal border.     No gallop.  Pulmonary:     Effort: Pulmonary effort is normal.     Breath sounds: Normal breath sounds.  Abdominal:     General: Bowel sounds are normal.     Palpations: Abdomen is soft.  Musculoskeletal:     Right lower leg: No edema.     Left lower leg: No edema.    EKG:    EKG Interpretation Date/Time:  Monday May 10 2024 10:26:37 EDT Ventricular Rate:  71 PR Interval:  178 QRS Duration:  78 QT Interval:  374 QTC Calculation: 406 R Axis:   6  Text Interpretation: EKG 05/10/2024: Normal sinus rhythm at rate of 71 bpm, anteroseptal infarct old.  Single PVC.  Compared to 04/11/2018, no change. Confirmed by Earleen Aoun, Jagadeesh ([id number]) on 05/10/2024 11:03:43 AM    ASSESSMENT AND PLAN: .      ICD-10-CM   1. Asymptomatic bilateral carotid artery stenosis  I65.23 VAS US  CAROTID    2. Coronary artery disease involving native coronary artery of native heart without angina pectoris  I25.10 EKG 12-Lead    ezetimibe  (ZETIA ) 10 MG tablet    3. Hypercholesteremia  E78.00 ezetimibe  (ZETIA ) 10 MG tablet     Assessment & Plan Left carotid artery stenosis, stable The left carotid artery stenosis has remained stable at approximately 50% since 2020, with no symptoms or significant changes. - Schedule carotid artery duplex in one year. - Consider discontinuation of carotid surveillance if carotid stenosis has remained stable given her advanced age of 47 years.  Atherosclerotic heart disease of native coronary artery without angina pectoris, status post coronary stent Status post coronary stent placement in 2001. No current symptoms such as chest pain or dyspnea. Echocardiogram from 2021 showed aortic sclerotic murmur, but no heart failure. Lungs are clear and vascular examination is normal.  Essential hypertension Blood pressure was elevated upon arrival but normalized after resting. Current regimen includes olmesartan  40 mg and  hydrochlorothiazide 25 mg. Hydralazine  was discontinued due to fatigue and leg pain. Blood pressure is 140/70 mmHg however this is being monitored and managed by nephrology as well hence no changes were done by me today.  Hyperlipidemia on inclisiran and ezetimibe , statin intolerant Management includes inclisiran and ezetimibe . Statins are not tolerated due to myalgia. Cholesterol levels have fluctuated, possibly due to discontinuation of ezetimibe  after starting inclisiran. - Reintroduce ezetimibe  for cholesterol management. - Use [facility] for medication delivery.   Follow up: 1 year for carotid stenosis, hyperlipidemia and hypertension  Signed,  Gordy Bergamo, MD, [facility] 05/10/2024, 7:45 PM [facility] [address] [place], [place]  [address] Phone: [phone]. Fax:  [phone]

## 2024-06-16 ENCOUNTER — Encounter: Payer: Self-pay | Admitting: Podiatry

## 2024-06-16 ENCOUNTER — Ambulatory Visit (INDEPENDENT_AMBULATORY_CARE_PROVIDER_SITE_OTHER): Admitting: Podiatry

## 2024-06-16 DIAGNOSIS — M79674 Pain in right toe(s): Secondary | ICD-10-CM

## 2024-06-16 DIAGNOSIS — N183 Chronic kidney disease, stage 3 unspecified: Secondary | ICD-10-CM | POA: Diagnosis not present

## 2024-06-16 DIAGNOSIS — M79675 Pain in left toe(s): Secondary | ICD-10-CM

## 2024-06-16 DIAGNOSIS — B351 Tinea unguium: Secondary | ICD-10-CM

## 2024-06-16 DIAGNOSIS — D689 Coagulation defect, unspecified: Secondary | ICD-10-CM | POA: Diagnosis not present

## 2024-06-16 NOTE — Progress Notes (Signed)
 This patient returns to my office for at risk foot care.  This patient requires this care by a professional since this patient will be at risk due to having chronic kidney disease and PAD.Marland Kitchen  This patient is unable to cut nails herself since the patient cannot reach her nails.These nails are painful walking and wearing shoes.  This patient presents for at risk foot care today.    General Appearance  Alert, conversant and in no acute stress.  Vascular  Dorsalis pedis and posterior tibial  pulses are  weakly palpable  bilaterally.  Capillary return is within normal limits  bilaterally. Temperature is within normal limits  bilaterally.  Neurologic  Senn-Weinstein monofilament wire test within normal limits  bilaterally. Muscle power within normal limits bilaterally.  Nails Thick disfigured discolored nails with subungual debris  from hallux to fifth toes bilaterally. No evidence of bacterial infection or drainage bilaterally.  Orthopedic  No limitations of motion  feet .  No crepitus or effusions noted.  No bony pathology or digital deformities noted.  HAV  B/L.  Skin  normotropic skin with no porokeratosis noted bilaterally.  No signs of infections or ulcers noted.   Asymptomatic HD 5th B/L.  Onychomycosis  Pain in right toes  Pain in left toes  Consent was obtained for treatment procedures.   Mechanical debridement of nails 1-5  bilaterally performed with a nail nipper.  Filed with dremel without incident.    Return office visit   3 months                   Told patient to return for periodic foot care and evaluation due to potential at risk complications.   Helane Gunther DPM

## 2024-06-18 ENCOUNTER — Other Ambulatory Visit: Payer: Self-pay | Admitting: Nurse Practitioner

## 2024-07-12 ENCOUNTER — Ambulatory Visit (INDEPENDENT_AMBULATORY_CARE_PROVIDER_SITE_OTHER): Payer: Self-pay | Admitting: Nurse Practitioner

## 2024-07-12 ENCOUNTER — Encounter: Payer: Self-pay | Admitting: Nurse Practitioner

## 2024-07-12 DIAGNOSIS — D649 Anemia, unspecified: Secondary | ICD-10-CM | POA: Diagnosis not present

## 2024-07-12 DIAGNOSIS — M545 Low back pain, unspecified: Secondary | ICD-10-CM | POA: Diagnosis not present

## 2024-07-12 DIAGNOSIS — E782 Mixed hyperlipidemia: Secondary | ICD-10-CM | POA: Diagnosis not present

## 2024-07-12 DIAGNOSIS — G4733 Obstructive sleep apnea (adult) (pediatric): Secondary | ICD-10-CM

## 2024-07-12 DIAGNOSIS — G8929 Other chronic pain: Secondary | ICD-10-CM

## 2024-07-12 DIAGNOSIS — I10 Essential (primary) hypertension: Secondary | ICD-10-CM | POA: Diagnosis not present

## 2024-07-12 DIAGNOSIS — E039 Hypothyroidism, unspecified: Secondary | ICD-10-CM | POA: Diagnosis not present

## 2024-07-12 DIAGNOSIS — I2510 Atherosclerotic heart disease of native coronary artery without angina pectoris: Secondary | ICD-10-CM

## 2024-07-12 DIAGNOSIS — M170 Bilateral primary osteoarthritis of knee: Secondary | ICD-10-CM

## 2024-07-12 DIAGNOSIS — M8580 Other specified disorders of bone density and structure, unspecified site: Secondary | ICD-10-CM | POA: Diagnosis not present

## 2024-07-12 DIAGNOSIS — G629 Polyneuropathy, unspecified: Secondary | ICD-10-CM | POA: Diagnosis not present

## 2024-07-12 DIAGNOSIS — J4540 Moderate persistent asthma, uncomplicated: Secondary | ICD-10-CM | POA: Diagnosis not present

## 2024-07-12 MED ORDER — DICLOFENAC SODIUM 1 % EX GEL: 4.0000 g | Freq: Four times a day (QID) | CUTANEOUS | 2 refills | Status: AC

## 2024-07-12 NOTE — Progress Notes (Signed)
 "   Careteam: Patient Care Team: [name], NP as PCP - General (Geriatric Medicine) Loreda Hacker, DPM as Consulting Physician (Podiatry) Ladona Heinz, MD as Consulting Physician (Cardiology) Octavia Charleston, MD as Referring Physician (Ophthalmology) Jerrye Katheryn BROCKS, MD as Consulting Physician (Nephrology)  PLACE OF SERVICE:  [facility]  Advanced Directive information    Allergies[1]  Chief Complaint  Patient presents with   Medical Management of Chronic Issues    3 month follow-up. Patient has zetia  rx but did not get it filled.     HPI:  Discussed the use of AI scribe software for clinical note transcription with the patient, who gave verbal consent to proceed.  History of Present Illness Brenda MALLOZZI is an 88 year old female who presents for a three-month follow-up.  She monitors her blood pressure at home, with readings such as 124/83 mmHg and 114/71 mmHg, which are lower than those recorded in the clinic. She attributes higher clinic readings to anxiety. She is currently taking Benicar  and hydrochlorothiazide for blood pressure management and has discontinued hydralazine  after discussing symptoms of back pain and daytime sleepiness with her kidney doctor.  For hyperlipidemia, she has not started Zetia , preferring dietary changes to manage her cholesterol. She is currently taking Lovaza for cholesterol management.  She has a history of osteopenia and is taking calcium and vitamin D supplements.   She also has asthma, for which she uses inhalers, including municinib, two puffs twice daily, and occasionally albuterol , about once a month.  She has a history of sleep apnea but could not tolerate CPAP therapy. She has adjusted her sleeping position with additional pillows and reports no recent episodes.  She experiences low back pain, particularly at night, and uses rice socks for relief. The pain is localized to the right side, without radiation down the leg. She also  reports knee pain, more pronounced in the right knee, and uses Tylenol  for relief.  She describes symptoms of neuropathy, including tingling and numbness in both feet, primarily at night. The symptoms improve upon walking. She has discussed these symptoms with her podiatrist but has not pursued further treatment.   Review of Systems:  Review of Systems  Constitutional:  Negative for chills, fever and weight loss.  HENT:  Negative for tinnitus.   Respiratory:  Negative for cough, sputum production and shortness of breath.   Cardiovascular:  Negative for chest pain, palpitations and leg swelling.  Gastrointestinal:  Negative for abdominal pain, constipation, diarrhea and heartburn.  Genitourinary:  Negative for dysuria, frequency and urgency.  Musculoskeletal:  Positive for back pain and joint pain. Negative for falls and myalgias.  Skin: Negative.   Neurological:  Negative for dizziness and headaches.  Psychiatric/Behavioral:  Negative for depression and memory loss. The patient does not have insomnia.     Past Medical History:  Diagnosis Date   Anemia    Per [facility] New Patient Packet   Asthma    Per [facility] New Patient Packet   Bilateral carotid bruits 02/12/2019   Coronary artery disease    Glaucoma    Per [facility] New Patient Packet   H/O bone density study    Per [facility] New Patient Packet   H/O heart artery stent    Per [facility] New Patient Packet   H/O mammogram    Per [facility] New Patient Packet   Heart murmur    Per [facility] New Patient Packet   Hyperlipidemia    Hypertension    Hypothyroid  Per [facility] New Patient Packet   Nerve pain    Per [facility] New Patient Packet   Peripheral artery disease 02/12/2019   Prediabetes    Per [facility] New Patient Packet   Shingles    Per [facility] New Patient Packet   Stage 3 chronic kidney disease (HCC)    Per [facility] New Patient Packet   Past Surgical History:  Procedure Laterality Date   BREAST LUMPECTOMY     1980's Per [facility] New Patient Packet   CATARACT EXTRACTION      2018, Per [facility] New Patient Packet   COLONOSCOPY     Per [facility] New Patient Packet   HIP SURGERY Right 10/05/2018   TONSILLECTOMY     1950's Per [facility] New Patient Packet   Social History:   reports that she quit smoking about 53 years ago. Her smoking use included cigarettes. She started smoking about 60 years ago. She has a 1.8 pack-year smoking history. She has never used smokeless tobacco. She reports that she does not drink alcohol and does not use drugs.  Family History  Problem Relation Age of Onset   Tuberculosis Mother    Heart disease Father    Schizophrenia Daughter     Medications: Patient's Medications  New Prescriptions   No medications on file  Previous Medications   ALBUTEROL  (VENTOLIN  HFA) 108 (90 BASE) MCG/ACT INHALER    Inhale 2 puffs into the lungs as needed.   ASPIRIN  (ASPIRIN  CHILDRENS) 81 MG CHEWABLE TABLET    Chew 1 tablet (81 mg total) by mouth daily.   BLOOD PRESSURE MONITORING (ADULT BLOOD PRESSURE CUFF LG) KIT    To check blood pressure 1 hour AFTER you have had your medication Make sure you have been sitting at least 5 mins Record and let us  know. Goal <140/90   BUDESONIDE -FORMOTEROL  (BREYNA ) 160-4.5 MCG/ACT INHALER    Inhale 2 puffs into the lungs 2 (two) times daily.   CHOLECALCIFEROL 50 MCG (2000 UT) CAPS    Take 1 capsule by mouth daily.   CYANOCOBALAMIN  (VITAMIN B12) 1000 MCG TABLET    Take 1,000 mcg by mouth once a week. 5 days a week   EZETIMIBE  (ZETIA ) 10 MG TABLET    Take 1 tablet (10 mg total) by mouth daily.   FERROUS SULFATE  325 (65 FE) MG EC TABLET    Take 1 tablet (325 mg total) by mouth daily with breakfast.   HYDROCHLOROTHIAZIDE (HYDRODIURIL) 25 MG TABLET    Take 25 mg by mouth daily.   INCLISIRAN (LEQVIO ) 284 MG/1.5ML SOSY INJECTION    Inject 284 mg into the skin every 6 (six) months.   KETOTIFEN (ZADITOR) 0.035 % OPHTHALMIC SOLUTION    Place 1 drop into both eyes as needed (for dry eyes).   LATANOPROST (XALATAN) 0.005 % OPHTHALMIC SOLUTION     Place 1 drop into both eyes at bedtime.   OLMESARTAN  (BENICAR ) 40 MG TABLET    Take 40 mg by mouth daily.   SYNTHROID  88 MCG TABLET    TAKE 1 TABLET DAILY BEFORE BREAKFAST  Modified Medications   No medications on file  Discontinued Medications   No medications on file    Physical Exam:  Vitals:   07/12/24 0921 07/12/24 1002  BP: (!) 138/92 (!) 138/90  Pulse: 66   Temp: (!) 97.2 F (36.2 C)   SpO2: 99%   Height: 5' 5 (1.651 m)    Body mass index is 34.01 kg/m. Wt Readings from Last 3 Encounters:  05/06/24  204 lb 6.4 oz (92.7 kg)  04/12/24 205 lb (93 kg)  03/12/24 212 lb 3.2 oz (96.3 kg)    Physical Exam Constitutional:      General: She is not in acute distress.    Appearance: She is well-developed. She is not diaphoretic.  HENT:     Head: Normocephalic and atraumatic.     Mouth/Throat:     Pharynx: No oropharyngeal exudate.  Eyes:     Conjunctiva/sclera: Conjunctivae normal.     Pupils: Pupils are equal, round, and reactive to light.  Cardiovascular:     Rate and Rhythm: Normal rate and regular rhythm.     Heart sounds: Normal heart sounds.  Pulmonary:     Effort: Pulmonary effort is normal.     Breath sounds: Normal breath sounds.  Abdominal:     General: Bowel sounds are normal.     Palpations: Abdomen is soft.  Musculoskeletal:     Cervical back: Normal range of motion and neck supple.     Right lower leg: No edema.     Left lower leg: No edema.  Skin:    General: Skin is warm and dry.  Neurological:     Mental Status: She is alert and oriented to person, place, and time.  Psychiatric:        Mood and Affect: Mood normal.     Labs reviewed: Basic Metabolic Panel: Recent Labs    09/12/23 0954 09/22/23 1043 03/12/24 1000 03/24/24 1021  NA 143 139 141 140  K 4.5 3.8 4.5 4.1  CL 105 104 105 104  CO2 29 30 28 30   GLUCOSE 94 87 86 93  BUN 18 17 19  24*  CREATININE 1.52* 1.29* 1.37* 1.37*  CALCIUM 10.2 10.1 10.0 10.0  TSH 2.18  --   --   --     Liver Function Tests: Recent Labs    09/22/23 1043 03/12/24 1000 03/24/24 1021  AST 19 20 21   ALT 12 14 14   ALKPHOS 65  --  62  BILITOT 0.4 0.3 0.4  PROT 7.7 7.0 7.7  ALBUMIN 4.2  --  4.1   No results for input(s): LIPASE, AMYLASE in the last 8760 hours. No results for input(s): AMMONIA in the last 8760 hours. CBC: Recent Labs    09/22/23 1043 03/12/24 1000 03/24/24 1021  WBC 5.3 5.7 4.7  NEUTROABS 2.0 2,309 1.7  HGB 12.8 11.5* 12.1  HCT 39.9 37.1 37.0  MCV 91.5 93.2 89.8  PLT 276 279 270   Lipid Panel: Recent Labs    09/12/23 0954 03/12/24 1000  CHOL 218* 234*  HDL 82 79  LDLCALC 115* 139*  TRIG 105 68  CHOLHDL 2.7 3.0   TSH: Recent Labs    09/12/23 0954  TSH 2.18   A1C: No results found for: HGBA1C   Assessment/Plan  Assessment & Plan Essential hypertension Blood pressure well controlled at home. Suspected white coat hypertension due to elevated clinic readings. - Continue Benicar  and hydrochlorothiazide. - Bring home blood pressure monitor for calibration.  Mixed hyperlipidemia Cholesterol previously elevated. Prefers dietary modifications over starting Zetia . Currently on Lovaza. - Ordered lipid panel. - Continue Lovaza injections. - Encouraged dietary modifications.  Acquired hypothyroidism Currently on Synthroid  88 mcg. - Ordered TSH.  Moderate persistent asthma Asthma well-controlled. No recent flares. Uses albuterol  once a month. - Continue Municinib and albuterol  as needed.  Obstructive sleep apnea Unable to tolerate CPAP.  Adjusted sleeping position. - Continue current management without CPAP.  Polyneuropathy  Numbness and tingling in feet at night. Pain resolves with movement. - Monitor symptoms. - Advised regular foot checks. - Consider physical therapy if symptoms worsen.  Chronic right-sided low back pain Pain exacerbated by activity. No shooting pain. - Use Tylenol  325 mg, two tablets every six hours as  needed. - Apply heating pad as needed.  Primary osteoarthritis of both knees Knee pain, right knee more symptomatic. Pain exacerbated by pressure. - Prescribed Voltaren  gel four times daily as needed. - Use Tylenol  for additional pain relief.  Osteopenia Managed with calcium and vitamin D supplementation. - Continue calcium and vitamin D supplementation.   Return in about 6 months (around 01/10/2025) for routine follow up.  Tamey Wanek K. Caro BODILY [facility] [phone]      [1]  Allergies Allergen Reactions   Amlodipine Besylate Other (See Comments)    Nightmares and nerve pain Nightmares and nerve pain   Hydralazine  Hcl Other (See Comments)    Fatigue and leg pain   Atorvastatin Other (See Comments)    nightmare nightmare   Isosorbide  Dinitrate Other (See Comments)    Nerve pain, lower back pain, feels out of it Nerve pain, lower back pain, feels out of it   Breo Ellipta  [Fluticasone  Furoate-Vilanterol] Other (See Comments)    Tongue swelling   Rosuvastatin Other (See Comments)    Nightmare.  Hence takes it during day   "

## 2024-07-12 NOTE — Patient Instructions (Signed)
 For low back pain Tylenol  325 mg 2 tablets every 6 hours as needed for pain Heating pad as needed

## 2024-07-13 ENCOUNTER — Ambulatory Visit: Payer: Self-pay | Admitting: Nurse Practitioner

## 2024-07-13 LAB — COMPREHENSIVE METABOLIC PANEL WITH GFR
AG Ratio: 1.2 (calc) (ref 1.0–2.5)
ALT: 12 U/L (ref 6–29)
AST: 18 U/L (ref 10–35)
Albumin: 4.1 g/dL (ref 3.6–5.1)
Alkaline phosphatase (APISO): 75 U/L (ref 37–153)
BUN/Creatinine Ratio: 17 (calc) (ref 6–22)
BUN: 24 mg/dL (ref 7–25)
CO2: 29 mmol/L (ref 20–32)
Calcium: 10 mg/dL (ref 8.6–10.4)
Chloride: 104 mmol/L (ref 98–110)
Creat: 1.43 mg/dL — ABNORMAL HIGH (ref 0.60–0.95)
Globulin: 3.4 g/dL (ref 1.9–3.7)
Glucose, Bld: 86 mg/dL (ref 65–99)
Potassium: 4.4 mmol/L (ref 3.5–5.3)
Sodium: 141 mmol/L (ref 135–146)
Total Bilirubin: 0.5 mg/dL (ref 0.2–1.2)
Total Protein: 7.5 g/dL (ref 6.1–8.1)
eGFR: 36 mL/min/1.73m2 — ABNORMAL LOW

## 2024-07-13 LAB — CBC WITH DIFFERENTIAL/PLATELET
Absolute Lymphocytes: 2590 {cells}/uL (ref 850–3900)
Absolute Monocytes: 502 {cells}/uL (ref 200–950)
Basophils Absolute: 41 {cells}/uL (ref 0–200)
Basophils Relative: 0.7 %
Eosinophils Absolute: 289 {cells}/uL (ref 15–500)
Eosinophils Relative: 4.9 %
HCT: 38.1 % (ref 35.9–46.0)
Hemoglobin: 11.9 g/dL (ref 11.7–15.5)
MCH: 28.7 pg (ref 27.0–33.0)
MCHC: 31.2 g/dL — ABNORMAL LOW (ref 31.6–35.4)
MCV: 92 fL (ref 81.4–101.7)
MPV: 10.1 fL (ref 7.5–12.5)
Monocytes Relative: 8.5 %
Neutro Abs: 2478 {cells}/uL (ref 1500–7800)
Neutrophils Relative %: 42 %
Platelets: 288 Thousand/uL (ref 140–400)
RBC: 4.14 Million/uL (ref 3.80–5.10)
RDW: 13.1 % (ref 11.0–15.0)
Total Lymphocyte: 43.9 %
WBC: 5.9 Thousand/uL (ref 3.8–10.8)

## 2024-07-13 LAB — TSH: TSH: 1.24 m[IU]/L (ref 0.40–4.50)

## 2024-07-13 LAB — LIPID PANEL
Cholesterol: 210 mg/dL — ABNORMAL HIGH
HDL: 73 mg/dL
LDL Cholesterol (Calc): 119 mg/dL — ABNORMAL HIGH
Non-HDL Cholesterol (Calc): 137 mg/dL — ABNORMAL HIGH
Total CHOL/HDL Ratio: 2.9 (calc)
Triglycerides: 81 mg/dL

## 2024-08-03 ENCOUNTER — Telehealth: Payer: Self-pay

## 2024-08-03 NOTE — Telephone Encounter (Signed)
 Auth Submission: NO AUTH NEEDED Site of care: Site of care: [facility] Payer: Medicare A/B with BCBS FEP Medication & CPT/J Code(s) submitted: Leqvio  (Inclisiran) J1306 Diagnosis Code:  Route of submission (phone, fax, portal):  Phone # Fax # Auth type: Buy/Bill PB Units/visits requested: 284mg  x 2 doses Reference number:  Approval from: 08/03/24 to 08/21/25

## 2024-09-16 ENCOUNTER — Ambulatory Visit: Admitting: Podiatry

## 2024-10-22 ENCOUNTER — Ambulatory Visit: Payer: Self-pay | Admitting: Nurse Practitioner

## 2024-11-04 ENCOUNTER — Ambulatory Visit

## 2025-01-10 ENCOUNTER — Ambulatory Visit: Admitting: Nurse Practitioner
